# Patient Record
Sex: Female | Born: 1940 | Race: White | Hispanic: No | Smoking: Current every day smoker
Health system: Southern US, Community
[De-identification: ages and names within clinical notes are randomized; demographics above are authoritative.]

## PROBLEM LIST (undated history)

## (undated) DIAGNOSIS — E039 Hypothyroidism, unspecified: Secondary | ICD-10-CM

## (undated) DIAGNOSIS — E079 Disorder of thyroid, unspecified: Secondary | ICD-10-CM

## (undated) DIAGNOSIS — Z8489 Family history of other specified conditions: Secondary | ICD-10-CM

## (undated) DIAGNOSIS — J439 Emphysema, unspecified: Secondary | ICD-10-CM

## (undated) DIAGNOSIS — K219 Gastro-esophageal reflux disease without esophagitis: Secondary | ICD-10-CM

## (undated) HISTORY — PX: TUBAL LIGATION: SHX77

---

## 2004-02-06 ENCOUNTER — Other Ambulatory Visit: Payer: Self-pay

## 2004-06-15 ENCOUNTER — Emergency Department: Payer: Self-pay | Admitting: Emergency Medicine

## 2005-08-31 ENCOUNTER — Other Ambulatory Visit: Payer: Self-pay

## 2005-08-31 ENCOUNTER — Emergency Department: Payer: Self-pay | Admitting: Emergency Medicine

## 2005-10-08 ENCOUNTER — Ambulatory Visit: Payer: Self-pay | Admitting: Family Medicine

## 2005-10-14 ENCOUNTER — Ambulatory Visit: Payer: Self-pay | Admitting: Family Medicine

## 2006-09-04 ENCOUNTER — Emergency Department: Payer: Self-pay | Admitting: Internal Medicine

## 2006-09-05 ENCOUNTER — Emergency Department: Payer: Self-pay | Admitting: General Practice

## 2007-09-15 ENCOUNTER — Ambulatory Visit: Payer: Self-pay | Admitting: Internal Medicine

## 2010-01-04 ENCOUNTER — Ambulatory Visit: Payer: Self-pay | Admitting: Gastroenterology

## 2010-04-16 ENCOUNTER — Ambulatory Visit: Payer: Self-pay | Admitting: Ophthalmology

## 2010-04-23 ENCOUNTER — Ambulatory Visit: Payer: Self-pay | Admitting: Ophthalmology

## 2010-06-18 ENCOUNTER — Ambulatory Visit: Payer: Self-pay | Admitting: Ophthalmology

## 2010-06-25 ENCOUNTER — Ambulatory Visit: Payer: Self-pay | Admitting: Ophthalmology

## 2011-02-02 ENCOUNTER — Emergency Department: Payer: Self-pay | Admitting: Emergency Medicine

## 2011-02-24 ENCOUNTER — Inpatient Hospital Stay: Payer: Self-pay | Admitting: Internal Medicine

## 2011-07-01 ENCOUNTER — Inpatient Hospital Stay: Payer: Self-pay | Admitting: Internal Medicine

## 2011-07-13 ENCOUNTER — Emergency Department: Payer: Self-pay | Admitting: Unknown Physician Specialty

## 2011-07-16 ENCOUNTER — Observation Stay: Payer: Self-pay | Admitting: Internal Medicine

## 2011-07-18 ENCOUNTER — Observation Stay: Payer: Self-pay | Admitting: Internal Medicine

## 2011-07-23 LAB — BASIC METABOLIC PANEL
BUN: 13 mg/dL (ref 7–18)
Calcium, Total: 9.1 mg/dL (ref 8.5–10.1)
Chloride: 106 mmol/L (ref 98–107)
Creatinine: 0.57 mg/dL — ABNORMAL LOW (ref 0.60–1.30)
EGFR (African American): >60
Osmolality: 281 (ref 275–301)

## 2011-07-23 LAB — CBC WITH DIFFERENTIAL/PLATELET
Eosinophil %: 2.2 %
HGB: 10 g/dL — ABNORMAL LOW (ref 12.0–16.0)
Lymphocyte %: 21.3 %
MCH: 28.2 pg (ref 26.0–34.0)
MCHC: 31.5 g/dL — ABNORMAL LOW (ref 32.0–36.0)
MCV: 89 fL (ref 80–100)
Monocyte %: 10.9 %
Neutrophil %: 65.6 %
Platelet: 315 10*3/uL (ref 150–440)

## 2011-08-02 ENCOUNTER — Emergency Department: Payer: Self-pay | Admitting: Emergency Medicine

## 2011-08-02 LAB — COMPREHENSIVE METABOLIC PANEL
Anion Gap: 12 (ref 7–16)
Bilirubin,Total: 0.2 mg/dL (ref 0.2–1.0)
Calcium, Total: 8.7 mg/dL (ref 8.5–10.1)
Chloride: 107 mmol/L (ref 98–107)
Creatinine: 0.59 mg/dL — ABNORMAL LOW (ref 0.60–1.30)
EGFR (African American): >60
Glucose: 124 mg/dL — ABNORMAL HIGH (ref 65–99)
SGOT(AST): 20 U/L (ref 15–37)
Sodium: 142 mmol/L (ref 136–145)

## 2011-08-02 LAB — CBC
HGB: 10.3 g/dL — ABNORMAL LOW (ref 12.0–16.0)
MCH: 27.6 pg (ref 26.0–34.0)
MCHC: 32 g/dL (ref 32.0–36.0)
Platelet: 348 10*3/uL (ref 150–440)
WBC: 5.1 10*3/uL (ref 3.6–11.0)

## 2011-08-02 LAB — URINALYSIS, COMPLETE
Bacteria: NONE SEEN
Bilirubin,UR: NEGATIVE
Glucose,UR: NEGATIVE mg/dL (ref 0–75)
Ketone: NEGATIVE
Leukocyte Esterase: NEGATIVE
Ph: 6 (ref 4.5–8.0)
Protein: NEGATIVE
RBC,UR: <1 /HPF (ref 0–5)
WBC UR: <1 /HPF (ref 0–5)

## 2011-08-02 LAB — TROPONIN I: Troponin-I: <0.02 ng/mL

## 2011-08-07 LAB — COMPREHENSIVE METABOLIC PANEL
Albumin: 3.4 g/dL (ref 3.4–5.0)
Anion Gap: 13 (ref 7–16)
BUN: 12 mg/dL (ref 7–18)
Bilirubin,Total: 0.2 mg/dL (ref 0.2–1.0)
Creatinine: 0.6 mg/dL (ref 0.60–1.30)
Glucose: 82 mg/dL (ref 65–99)
Potassium: 3.6 mmol/L (ref 3.5–5.1)
SGOT(AST): 23 U/L (ref 15–37)
Sodium: 148 mmol/L — ABNORMAL HIGH (ref 136–145)
Total Protein: 7 g/dL (ref 6.4–8.2)

## 2011-08-07 LAB — CBC
HCT: 34 % — ABNORMAL LOW (ref 35.0–47.0)
HGB: 10.9 g/dL — ABNORMAL LOW (ref 12.0–16.0)
MCH: 27.4 pg (ref 26.0–34.0)
MCHC: 32.2 g/dL (ref 32.0–36.0)
MCV: 85 fL (ref 80–100)
Platelet: 365 10*3/uL (ref 150–440)
RBC: 4 10*6/uL (ref 3.80–5.20)
WBC: 5.7 10*3/uL (ref 3.6–11.0)

## 2011-08-07 LAB — TSH: Thyroid Stimulating Horm: 2.12 u[IU]/mL

## 2011-08-07 LAB — SALICYLATE LEVEL: Salicylates, Serum: <1.7 mg/dL

## 2011-08-07 LAB — URINALYSIS, COMPLETE
Bacteria: NONE SEEN
Bilirubin,UR: NEGATIVE
Blood: NEGATIVE
Glucose,UR: NEGATIVE mg/dL (ref 0–75)
Leukocyte Esterase: NEGATIVE
Nitrite: NEGATIVE
Protein: NEGATIVE
RBC,UR: <1 /HPF (ref 0–5)
Squamous Epithelial: NONE SEEN

## 2011-08-07 LAB — DRUG SCREEN, URINE
Barbiturates, Ur Screen: NEGATIVE (ref ?–200)
Cannabinoid 50 Ng, Ur ~~LOC~~: NEGATIVE (ref ?–50)
Cocaine Metabolite,Ur ~~LOC~~: NEGATIVE (ref ?–300)
MDMA (Ecstasy)Ur Screen: NEGATIVE (ref ?–500)
Tricyclic, Ur Screen: NEGATIVE (ref ?–1000)

## 2011-08-07 LAB — ETHANOL
Ethanol %: <0.003 % (ref 0.000–0.080)
Ethanol: <3 mg/dL

## 2011-08-08 ENCOUNTER — Inpatient Hospital Stay: Payer: Self-pay | Admitting: Psychiatry

## 2011-08-15 LAB — URINALYSIS, COMPLETE
Bacteria: NONE SEEN
Bilirubin,UR: NEGATIVE
Blood: NEGATIVE
Leukocyte Esterase: NEGATIVE
Ph: 7 (ref 4.5–8.0)
Protein: NEGATIVE
RBC,UR: <1 /HPF (ref 0–5)
Specific Gravity: 1.006 (ref 1.003–1.030)

## 2011-08-18 LAB — CBC WITH DIFFERENTIAL/PLATELET
Basophil #: 0 10*3/uL (ref 0.0–0.1)
Eosinophil #: 0 10*3/uL (ref 0.0–0.7)
HCT: 36.8 % (ref 35.0–47.0)
Lymphocyte %: 19 %
MCHC: 32.6 g/dL (ref 32.0–36.0)
Monocyte %: 9.3 %
Neutrophil #: 4 10*3/uL (ref 1.4–6.5)
Neutrophil %: 70.5 %
Platelet: 320 10*3/uL (ref 150–440)
RBC: 4.34 10*6/uL (ref 3.80–5.20)
RDW: 17.5 % — ABNORMAL HIGH (ref 11.5–14.5)
WBC: 5.5 10*3/uL (ref 3.6–11.0)

## 2011-08-19 LAB — CLOSTRIDIUM DIFFICILE BY PCR

## 2011-08-24 ENCOUNTER — Inpatient Hospital Stay: Payer: Self-pay | Admitting: Internal Medicine

## 2011-08-24 LAB — COMPREHENSIVE METABOLIC PANEL
Albumin: 3.3 g/dL — ABNORMAL LOW (ref 3.4–5.0)
Alkaline Phosphatase: 61 U/L (ref 50–136)
Anion Gap: 11 (ref 7–16)
BUN: 16 mg/dL (ref 7–18)
Calcium, Total: 8.9 mg/dL (ref 8.5–10.1)
Chloride: 108 mmol/L — ABNORMAL HIGH (ref 98–107)
Co2: 24 mmol/L (ref 21–32)
EGFR (African American): >60
EGFR (Non-African Amer.): >60
Osmolality: 287 (ref 275–301)
Potassium: 3.8 mmol/L (ref 3.5–5.1)
SGOT(AST): 31 U/L (ref 15–37)
SGPT (ALT): 21 U/L

## 2011-08-24 LAB — URINALYSIS, COMPLETE
Bacteria: NONE SEEN
Bilirubin,UR: NEGATIVE
Blood: NEGATIVE
Glucose,UR: NEGATIVE mg/dL (ref 0–75)
Ketone: NEGATIVE
Leukocyte Esterase: NEGATIVE
Ph: 7 (ref 4.5–8.0)
Specific Gravity: 1.008 (ref 1.003–1.030)
Squamous Epithelial: NONE SEEN
WBC UR: 1 /HPF (ref 0–5)

## 2011-08-24 LAB — CBC
HCT: 36.5 % (ref 35.0–47.0)
HGB: 11.6 g/dL — ABNORMAL LOW (ref 12.0–16.0)
MCH: 27.4 pg (ref 26.0–34.0)
MCHC: 31.9 g/dL — ABNORMAL LOW (ref 32.0–36.0)
RBC: 4.24 10*6/uL (ref 3.80–5.20)
RDW: 19.4 % — ABNORMAL HIGH (ref 11.5–14.5)
WBC: 10 10*3/uL (ref 3.6–11.0)

## 2011-08-24 LAB — TROPONIN I
Troponin-I: 0.59 ng/mL — ABNORMAL HIGH
Troponin-I: 0.25 ng/mL — ABNORMAL HIGH
Troponin-I: 0.18 ng/mL — ABNORMAL HIGH

## 2011-08-24 LAB — MAGNESIUM: Magnesium: 2.1 mg/dL

## 2011-08-24 LAB — CK TOTAL AND CKMB (NOT AT ARMC): CK-MB: 0.9 ng/mL (ref 0.5–3.6)

## 2011-08-25 LAB — COMPREHENSIVE METABOLIC PANEL WITH GFR
Albumin: 2.6 g/dL — ABNORMAL LOW
Alkaline Phosphatase: 51 U/L
Anion Gap: 11
BUN: 9 mg/dL
Bilirubin,Total: 0.2 mg/dL
Calcium, Total: 8.4 mg/dL — ABNORMAL LOW
Chloride: 111 mmol/L — ABNORMAL HIGH
Co2: 24 mmol/L
Creatinine: 0.44 mg/dL — ABNORMAL LOW
EGFR (African American): >60
EGFR (Non-African Amer.): >60
Glucose: 79 mg/dL
Osmolality: 288
Potassium: 3.2 mmol/L — ABNORMAL LOW
SGOT(AST): 17 U/L
SGPT (ALT): 15 U/L
Sodium: 146 mmol/L — ABNORMAL HIGH
Total Protein: 5.8 g/dL — ABNORMAL LOW

## 2011-08-25 LAB — CBC WITH DIFFERENTIAL/PLATELET
Basophil #: 0 10*3/uL (ref 0.0–0.1)
Eosinophil #: 0.1 10*3/uL (ref 0.0–0.7)
HCT: 31.3 % — ABNORMAL LOW (ref 35.0–47.0)
HGB: 9.8 g/dL — ABNORMAL LOW (ref 12.0–16.0)
Lymphocyte #: 1.8 10*3/uL (ref 1.0–3.6)
Lymphocyte %: 18.5 %
MCHC: 31.4 g/dL — ABNORMAL LOW (ref 32.0–36.0)
MCV: 87 fL (ref 80–100)
Monocyte #: 1.1 10*3/uL — ABNORMAL HIGH (ref 0.0–0.7)
Monocyte %: 11.2 %
Neutrophil #: 6.7 10*3/uL — ABNORMAL HIGH (ref 1.4–6.5)
Neutrophil %: 68.9 %
WBC: 9.8 10*3/uL (ref 3.6–11.0)

## 2011-08-25 LAB — LIPID PANEL
Cholesterol: 158 mg/dL
HDL Cholesterol: 64 mg/dL — ABNORMAL HIGH
Ldl Cholesterol, Calc: 82 mg/dL
Triglycerides: 61 mg/dL
VLDL Cholesterol, Calc: 12 mg/dL

## 2011-08-25 LAB — TSH: Thyroid Stimulating Horm: 0.959 u[IU]/mL

## 2011-08-26 LAB — CBC WITH DIFFERENTIAL/PLATELET
Basophil #: 0 10*3/uL (ref 0.0–0.1)
Eosinophil #: 0.4 10*3/uL (ref 0.0–0.7)
Eosinophil %: 4.6 %
HCT: 33.5 % — ABNORMAL LOW (ref 35.0–47.0)
HGB: 10.3 g/dL — ABNORMAL LOW (ref 12.0–16.0)
Lymphocyte #: 0.9 10*3/uL — ABNORMAL LOW (ref 1.0–3.6)
Lymphocyte %: 11.3 %
MCH: 26.7 pg (ref 26.0–34.0)
MCHC: 30.9 g/dL — ABNORMAL LOW (ref 32.0–36.0)
Monocyte #: 0.8 10*3/uL — ABNORMAL HIGH (ref 0.0–0.7)
Monocyte %: 9.5 %
Neutrophil %: 74.6 %
Platelet: 266 10*3/uL (ref 150–440)
RDW: 19.1 % — ABNORMAL HIGH (ref 11.5–14.5)
WBC: 8.2 10*3/uL (ref 3.6–11.0)

## 2011-08-27 LAB — HEMOGLOBIN: HGB: 10.6 g/dL — ABNORMAL LOW (ref 12.0–16.0)

## 2011-08-28 LAB — HEMOGLOBIN: HGB: 9.4 g/dL — ABNORMAL LOW (ref 12.0–16.0)

## 2011-08-29 LAB — BASIC METABOLIC PANEL
BUN: 8 mg/dL (ref 7–18)
Chloride: 106 mmol/L (ref 98–107)
Co2: 25 mmol/L (ref 21–32)
Creatinine: 0.5 mg/dL — ABNORMAL LOW (ref 0.60–1.30)
EGFR (African American): >60
Glucose: 93 mg/dL (ref 65–99)
Potassium: 4.1 mmol/L (ref 3.5–5.1)
Sodium: 143 mmol/L (ref 136–145)

## 2011-08-29 LAB — HEMOGLOBIN: HGB: 10.4 g/dL — ABNORMAL LOW (ref 12.0–16.0)

## 2011-10-06 ENCOUNTER — Observation Stay: Payer: Self-pay | Admitting: Internal Medicine

## 2011-10-06 LAB — URINALYSIS, COMPLETE
Glucose,UR: NEGATIVE mg/dL (ref 0–75)
Ketone: NEGATIVE
Leukocyte Esterase: NEGATIVE
Ph: 7 (ref 4.5–8.0)
Protein: NEGATIVE
RBC,UR: NONE SEEN /HPF (ref 0–5)
Specific Gravity: 1.006 (ref 1.003–1.030)
Squamous Epithelial: <1
WBC UR: <1 /HPF (ref 0–5)

## 2011-10-06 LAB — TSH
Thyroid Stimulating Horm: 1.72 u[IU]/mL
Thyroid Stimulating Horm: 1.75 u[IU]/mL

## 2011-10-06 LAB — TROPONIN I
Troponin-I: <0.02 ng/mL
Troponin-I: <0.02 ng/mL

## 2011-10-06 LAB — COMPREHENSIVE METABOLIC PANEL
Albumin: 3.4 g/dL (ref 3.4–5.0)
Alkaline Phosphatase: 73 U/L (ref 50–136)
Bilirubin,Total: 0.2 mg/dL (ref 0.2–1.0)
Calcium, Total: 9.2 mg/dL (ref 8.5–10.1)
Co2: 23 mmol/L (ref 21–32)
Creatinine: 0.62 mg/dL (ref 0.60–1.30)
EGFR (Non-African Amer.): >60
Glucose: 87 mg/dL (ref 65–99)
SGPT (ALT): 16 U/L

## 2011-10-06 LAB — CBC
HCT: 40 % (ref 35.0–47.0)
HGB: 12.8 g/dL (ref 12.0–16.0)
MCV: 82 fL (ref 80–100)
RBC: 4.9 10*6/uL (ref 3.80–5.20)
WBC: 6.1 10*3/uL (ref 3.6–11.0)

## 2011-10-06 LAB — CK TOTAL AND CKMB (NOT AT ARMC): CK, Total: 38 U/L (ref 21–215)

## 2011-10-07 LAB — BASIC METABOLIC PANEL
BUN: 13 mg/dL (ref 7–18)
Calcium, Total: 8.7 mg/dL (ref 8.5–10.1)
Co2: 22 mmol/L (ref 21–32)
EGFR (Non-African Amer.): >60
Glucose: 93 mg/dL (ref 65–99)
Osmolality: 287 (ref 275–301)
Potassium: 3.7 mmol/L (ref 3.5–5.1)
Sodium: 144 mmol/L (ref 136–145)

## 2011-10-07 LAB — LIPID PANEL
HDL Cholesterol: 59 mg/dL (ref 40–60)
Ldl Cholesterol, Calc: 130 mg/dL — ABNORMAL HIGH (ref 0–100)

## 2011-10-07 LAB — CBC WITH DIFFERENTIAL/PLATELET
Basophil #: 0 10*3/uL (ref 0.0–0.1)
Basophil %: 0.1 %
Eosinophil #: 0.2 10*3/uL (ref 0.0–0.7)
Lymphocyte #: 1.8 10*3/uL (ref 1.0–3.6)
Lymphocyte %: 29.9 %
Monocyte #: 0.6 10*3/uL (ref 0.0–0.7)
Neutrophil #: 3.6 10*3/uL (ref 1.4–6.5)
Neutrophil %: 57.8 %
Platelet: 286 10*3/uL (ref 150–440)
RBC: 4.65 10*6/uL (ref 3.80–5.20)
WBC: 6.2 10*3/uL (ref 3.6–11.0)

## 2011-10-07 LAB — TROPONIN I: Troponin-I: <0.02 ng/mL

## 2011-10-08 ENCOUNTER — Inpatient Hospital Stay: Payer: Self-pay | Admitting: Psychiatry

## 2011-10-09 LAB — URINALYSIS, COMPLETE
Glucose,UR: 50 mg/dL (ref 0–75)
Ketone: NEGATIVE
Nitrite: NEGATIVE
Ph: 5 (ref 4.5–8.0)
Protein: NEGATIVE
RBC,UR: 1 /HPF (ref 0–5)
Specific Gravity: 1.011 (ref 1.003–1.030)
Squamous Epithelial: <1
WBC UR: 1 /HPF (ref 0–5)

## 2011-10-09 LAB — BEHAVIORAL MEDICINE 1 PANEL
Albumin: 3.2 g/dL — ABNORMAL LOW (ref 3.4–5.0)
Alkaline Phosphatase: 72 U/L (ref 50–136)
Anion Gap: 13 (ref 7–16)
Calcium, Total: 9.1 mg/dL (ref 8.5–10.1)
Co2: 24 mmol/L (ref 21–32)
Comment - H1-Com1: NORMAL
Comment - H1-Com2: NORMAL
EGFR (African American): >60
EGFR (Non-African Amer.): >60
Eosinophil: 6 %
Glucose: 85 mg/dL (ref 65–99)
HCT: 39.3 % (ref 35.0–47.0)
MCH: 25.9 pg — ABNORMAL LOW (ref 26.0–34.0)
MCV: 82 fL (ref 80–100)
Monocytes: 7 %
RBC: 4.79 10*6/uL (ref 3.80–5.20)
RDW: 18.6 % — ABNORMAL HIGH (ref 11.5–14.5)
SGOT(AST): 22 U/L (ref 15–37)
SGPT (ALT): 18 U/L
Thyroid Stimulating Horm: 4.65 u[IU]/mL — ABNORMAL HIGH
Variant Lymphocyte - H1-Rlymph: 5 %

## 2011-10-24 ENCOUNTER — Ambulatory Visit: Payer: Self-pay | Admitting: Psychiatry

## 2011-10-31 ENCOUNTER — Emergency Department: Payer: Self-pay | Admitting: *Deleted

## 2011-11-02 ENCOUNTER — Emergency Department: Payer: Self-pay | Admitting: Emergency Medicine

## 2011-11-13 ENCOUNTER — Emergency Department: Payer: Self-pay | Admitting: Emergency Medicine

## 2011-11-13 LAB — URINALYSIS, COMPLETE
Bacteria: NONE SEEN
Blood: NEGATIVE
Glucose,UR: NEGATIVE mg/dL (ref 0–75)
Hyaline Cast: 1
Leukocyte Esterase: NEGATIVE
Protein: NEGATIVE
Specific Gravity: 1.006 (ref 1.003–1.030)
Squamous Epithelial: NONE SEEN
WBC UR: 1 /HPF (ref 0–5)

## 2011-11-13 LAB — CBC
MCH: 26.6 pg (ref 26.0–34.0)
MCHC: 32.2 g/dL (ref 32.0–36.0)
MCV: 83 fL (ref 80–100)
RBC: 5.18 10*6/uL (ref 3.80–5.20)
RDW: 20.7 % — ABNORMAL HIGH (ref 11.5–14.5)

## 2011-11-13 LAB — COMPREHENSIVE METABOLIC PANEL
Albumin: 3.5 g/dL (ref 3.4–5.0)
Alkaline Phosphatase: 87 U/L (ref 50–136)
BUN: 15 mg/dL (ref 7–18)
Calcium, Total: 8.8 mg/dL (ref 8.5–10.1)
EGFR (African American): >60
EGFR (Non-African Amer.): >60
Glucose: 66 mg/dL (ref 65–99)
Potassium: 4.1 mmol/L (ref 3.5–5.1)
SGOT(AST): 33 U/L (ref 15–37)
Sodium: 141 mmol/L (ref 136–145)
Total Protein: 7.2 g/dL (ref 6.4–8.2)

## 2011-11-13 LAB — TROPONIN I: Troponin-I: <0.02 ng/mL

## 2011-12-11 ENCOUNTER — Emergency Department: Payer: Self-pay | Admitting: Unknown Physician Specialty

## 2011-12-11 LAB — URINALYSIS, COMPLETE
Bacteria: NONE SEEN
Blood: NEGATIVE
Glucose,UR: NEGATIVE mg/dL (ref 0–75)
Nitrite: NEGATIVE
Ph: 5 (ref 4.5–8.0)
Protein: NEGATIVE
Specific Gravity: 1.019 (ref 1.003–1.030)
Squamous Epithelial: 1

## 2011-12-11 LAB — COMPREHENSIVE METABOLIC PANEL
Albumin: 2.9 g/dL — ABNORMAL LOW (ref 3.4–5.0)
Alkaline Phosphatase: 65 U/L (ref 50–136)
Anion Gap: 7 (ref 7–16)
Co2: 26 mmol/L (ref 21–32)
Creatinine: 0.52 mg/dL — ABNORMAL LOW (ref 0.60–1.30)
EGFR (African American): >60
Glucose: 67 mg/dL (ref 65–99)
Potassium: 3.5 mmol/L (ref 3.5–5.1)
SGPT (ALT): 17 U/L

## 2011-12-11 LAB — CBC
HGB: 14.9 g/dL (ref 12.0–16.0)
MCHC: 32 g/dL (ref 32.0–36.0)
RBC: 5.42 10*6/uL — ABNORMAL HIGH (ref 3.80–5.20)
RDW: 20.3 % — ABNORMAL HIGH (ref 11.5–14.5)

## 2011-12-11 LAB — DRUG SCREEN, URINE
Amphetamines, Ur Screen: NEGATIVE (ref ?–1000)
Barbiturates, Ur Screen: NEGATIVE (ref ?–200)
MDMA (Ecstasy)Ur Screen: NEGATIVE (ref ?–500)
Methadone, Ur Screen: NEGATIVE (ref ?–300)
Tricyclic, Ur Screen: NEGATIVE (ref ?–1000)

## 2011-12-11 LAB — ACETAMINOPHEN LEVEL: Acetaminophen: <2 ug/mL

## 2011-12-11 LAB — ETHANOL
Ethanol %: <0.003 % (ref 0.000–0.080)
Ethanol: <3 mg/dL

## 2011-12-11 LAB — SALICYLATE LEVEL: Salicylates, Serum: 2.1 mg/dL

## 2011-12-13 LAB — URINE CULTURE

## 2012-01-07 ENCOUNTER — Emergency Department: Payer: Self-pay | Admitting: Internal Medicine

## 2012-01-07 LAB — URINALYSIS, COMPLETE
Blood: NEGATIVE
Glucose,UR: NEGATIVE mg/dL (ref 0–75)
Protein: NEGATIVE
RBC,UR: 6 /HPF (ref 0–5)
Specific Gravity: 1.021 (ref 1.003–1.030)
Squamous Epithelial: NONE SEEN
WBC UR: <1 /HPF (ref 0–5)

## 2012-01-07 LAB — CBC
HGB: 15.8 g/dL (ref 12.0–16.0)
MCHC: 32.7 g/dL (ref 32.0–36.0)
MCV: 88 fL (ref 80–100)
Platelet: 211 10*3/uL (ref 150–440)
RBC: 5.48 10*6/uL — ABNORMAL HIGH (ref 3.80–5.20)

## 2012-01-07 LAB — DRUG SCREEN, URINE
Barbiturates, Ur Screen: NEGATIVE (ref ?–200)
Cannabinoid 50 Ng, Ur ~~LOC~~: NEGATIVE (ref ?–50)
MDMA (Ecstasy)Ur Screen: NEGATIVE (ref ?–500)
Methadone, Ur Screen: NEGATIVE (ref ?–300)
Opiate, Ur Screen: NEGATIVE (ref ?–300)
Tricyclic, Ur Screen: NEGATIVE (ref ?–1000)

## 2012-01-07 LAB — COMPREHENSIVE METABOLIC PANEL
BUN: 18 mg/dL (ref 7–18)
Bilirubin,Total: 0.3 mg/dL (ref 0.2–1.0)
Calcium, Total: 9 mg/dL (ref 8.5–10.1)
Chloride: 108 mmol/L — ABNORMAL HIGH (ref 98–107)
Co2: 25 mmol/L (ref 21–32)
Creatinine: 0.41 mg/dL — ABNORMAL LOW (ref 0.60–1.30)
EGFR (African American): >60
Potassium: 3.5 mmol/L (ref 3.5–5.1)
SGPT (ALT): 17 U/L
Sodium: 142 mmol/L (ref 136–145)
Total Protein: 7.3 g/dL (ref 6.4–8.2)

## 2012-01-07 LAB — TSH: Thyroid Stimulating Horm: 2.56 u[IU]/mL

## 2012-01-07 LAB — ETHANOL: Ethanol %: <0.003 % (ref 0.000–0.080)

## 2012-01-23 ENCOUNTER — Emergency Department: Payer: Self-pay | Admitting: *Deleted

## 2012-01-23 LAB — CK TOTAL AND CKMB (NOT AT ARMC): CK, Total: 36 U/L (ref 21–215)

## 2012-01-23 LAB — BASIC METABOLIC PANEL
Anion Gap: 7 (ref 7–16)
BUN: 16 mg/dL (ref 7–18)
Calcium, Total: 9 mg/dL (ref 8.5–10.1)
Chloride: 108 mmol/L — ABNORMAL HIGH (ref 98–107)
Co2: 27 mmol/L (ref 21–32)
Osmolality: 284 (ref 275–301)

## 2012-01-23 LAB — CBC
HGB: 14.2 g/dL (ref 12.0–16.0)
MCH: 29.2 pg (ref 26.0–34.0)
MCV: 91 fL (ref 80–100)
Platelet: 218 10*3/uL (ref 150–440)
RBC: 4.85 10*6/uL (ref 3.80–5.20)
RDW: 17.8 % — ABNORMAL HIGH (ref 11.5–14.5)

## 2012-01-23 LAB — TROPONIN I: Troponin-I: <0.02 ng/mL

## 2012-02-21 ENCOUNTER — Emergency Department: Payer: Self-pay | Admitting: *Deleted

## 2012-02-21 LAB — COMPREHENSIVE METABOLIC PANEL
Albumin: 3.2 g/dL — ABNORMAL LOW (ref 3.4–5.0)
Alkaline Phosphatase: 84 U/L (ref 50–136)
Bilirubin,Total: 0.4 mg/dL (ref 0.2–1.0)
Calcium, Total: 8.9 mg/dL (ref 8.5–10.1)
Chloride: 112 mmol/L — ABNORMAL HIGH (ref 98–107)
Creatinine: 0.82 mg/dL (ref 0.60–1.30)
EGFR (Non-African Amer.): >60
SGOT(AST): 26 U/L (ref 15–37)
SGPT (ALT): 15 U/L (ref 12–78)
Sodium: 145 mmol/L (ref 136–145)
Total Protein: 6.4 g/dL (ref 6.4–8.2)

## 2012-02-21 LAB — CBC WITH DIFFERENTIAL/PLATELET
Basophil #: 0.1 10*3/uL (ref 0.0–0.1)
Eosinophil #: 0.3 10*3/uL (ref 0.0–0.7)
Eosinophil %: 4.2 %
HCT: 44.5 % (ref 35.0–47.0)
HGB: 14.8 g/dL (ref 12.0–16.0)
Lymphocyte #: 2 10*3/uL (ref 1.0–3.6)
Lymphocyte %: 27.7 %
MCH: 30.7 pg (ref 26.0–34.0)
MCV: 92 fL (ref 80–100)
Monocyte %: 8.9 %
Neutrophil %: 58.2 %
RBC: 4.83 10*6/uL (ref 3.80–5.20)

## 2012-03-18 ENCOUNTER — Emergency Department: Payer: Self-pay | Admitting: Emergency Medicine

## 2012-03-18 LAB — CBC
MCH: 31.3 pg (ref 26.0–34.0)
MCHC: 33.8 g/dL (ref 32.0–36.0)
MCV: 93 fL (ref 80–100)
Platelet: 211 10*3/uL (ref 150–440)
RDW: 13.6 % (ref 11.5–14.5)
WBC: 6.1 10*3/uL (ref 3.6–11.0)

## 2012-03-18 LAB — URINALYSIS, COMPLETE
Bacteria: NONE SEEN
Bilirubin,UR: NEGATIVE
Glucose,UR: NEGATIVE mg/dL (ref 0–75)
Nitrite: NEGATIVE
Protein: NEGATIVE
Specific Gravity: 1.021 (ref 1.003–1.030)
Squamous Epithelial: 1
WBC UR: 3 /HPF (ref 0–5)

## 2012-03-18 LAB — DRUG SCREEN, URINE
Amphetamines, Ur Screen: NEGATIVE (ref ?–1000)
Benzodiazepine, Ur Scrn: NEGATIVE (ref ?–200)
Cannabinoid 50 Ng, Ur ~~LOC~~: NEGATIVE (ref ?–50)
Cocaine Metabolite,Ur ~~LOC~~: NEGATIVE (ref ?–300)
MDMA (Ecstasy)Ur Screen: NEGATIVE (ref ?–500)
Methadone, Ur Screen: NEGATIVE (ref ?–300)
Phencyclidine (PCP) Ur S: NEGATIVE (ref ?–25)

## 2012-03-18 LAB — COMPREHENSIVE METABOLIC PANEL
Alkaline Phosphatase: 73 U/L (ref 50–136)
BUN: 18 mg/dL (ref 7–18)
Calcium, Total: 8.9 mg/dL (ref 8.5–10.1)
Chloride: 108 mmol/L — ABNORMAL HIGH (ref 98–107)
Co2: 28 mmol/L (ref 21–32)
EGFR (African American): >60
EGFR (Non-African Amer.): >60
SGOT(AST): 20 U/L (ref 15–37)
SGPT (ALT): 13 U/L (ref 12–78)

## 2012-03-18 LAB — ETHANOL: Ethanol %: <0.003 % (ref 0.000–0.080)

## 2012-08-12 ENCOUNTER — Emergency Department: Payer: Self-pay | Admitting: Emergency Medicine

## 2012-08-12 LAB — COMPREHENSIVE METABOLIC PANEL
Albumin: 3.3 g/dL — ABNORMAL LOW (ref 3.4–5.0)
Alkaline Phosphatase: 133 U/L (ref 50–136)
Bilirubin,Total: 0.4 mg/dL (ref 0.2–1.0)
Calcium, Total: 8.8 mg/dL (ref 8.5–10.1)
Co2: 29 mmol/L (ref 21–32)
Creatinine: 0.62 mg/dL (ref 0.60–1.30)
Glucose: 81 mg/dL (ref 65–99)
SGPT (ALT): 15 U/L (ref 12–78)
Total Protein: 7.7 g/dL (ref 6.4–8.2)

## 2012-08-12 LAB — DRUG SCREEN, URINE
Amphetamines, Ur Screen: NEGATIVE (ref ?–1000)
Cocaine Metabolite,Ur ~~LOC~~: NEGATIVE (ref ?–300)
Methadone, Ur Screen: NEGATIVE (ref ?–300)
Opiate, Ur Screen: NEGATIVE (ref ?–300)
Phencyclidine (PCP) Ur S: NEGATIVE (ref ?–25)
Tricyclic, Ur Screen: NEGATIVE (ref ?–1000)

## 2012-08-12 LAB — URINALYSIS, COMPLETE
Bilirubin,UR: NEGATIVE
Blood: NEGATIVE
Ketone: NEGATIVE
Nitrite: NEGATIVE
Ph: 6 (ref 4.5–8.0)
Protein: NEGATIVE
RBC,UR: 5 /HPF (ref 0–5)
Specific Gravity: 1.018 (ref 1.003–1.030)
Squamous Epithelial: NONE SEEN

## 2012-08-12 LAB — TROPONIN I: Troponin-I: <0.02 ng/mL

## 2012-08-12 LAB — CBC
HGB: 15.3 g/dL (ref 12.0–16.0)
MCH: 29.3 pg (ref 26.0–34.0)
MCHC: 33.6 g/dL (ref 32.0–36.0)
MCV: 87 fL (ref 80–100)
Platelet: 272 10*3/uL (ref 150–440)
RBC: 5.24 10*6/uL — ABNORMAL HIGH (ref 3.80–5.20)
RDW: 14.2 % (ref 11.5–14.5)
WBC: 6.6 10*3/uL (ref 3.6–11.0)

## 2012-08-12 LAB — ETHANOL
Ethanol %: <0.003 % (ref 0.000–0.080)
Ethanol: <3 mg/dL

## 2012-08-17 LAB — BASIC METABOLIC PANEL
Anion Gap: 6 — ABNORMAL LOW (ref 7–16)
BUN: 22 mg/dL — ABNORMAL HIGH (ref 7–18)
Calcium, Total: 8.7 mg/dL (ref 8.5–10.1)
Chloride: 106 mmol/L (ref 98–107)
Co2: 28 mmol/L (ref 21–32)
Creatinine: 0.68 mg/dL (ref 0.60–1.30)
EGFR (African American): >60
EGFR (Non-African Amer.): >60
Glucose: 111 mg/dL — ABNORMAL HIGH (ref 65–99)
Osmolality: 283 (ref 275–301)
Potassium: 4 mmol/L (ref 3.5–5.1)
Sodium: 140 mmol/L (ref 136–145)

## 2012-10-29 IMAGING — CR DG CHEST 1V PORT
1 series · 1 of 1 positions shown · non-contrast
Comparison: none

REASON FOR EXAM: overdose
COMMENTS:

[portable]
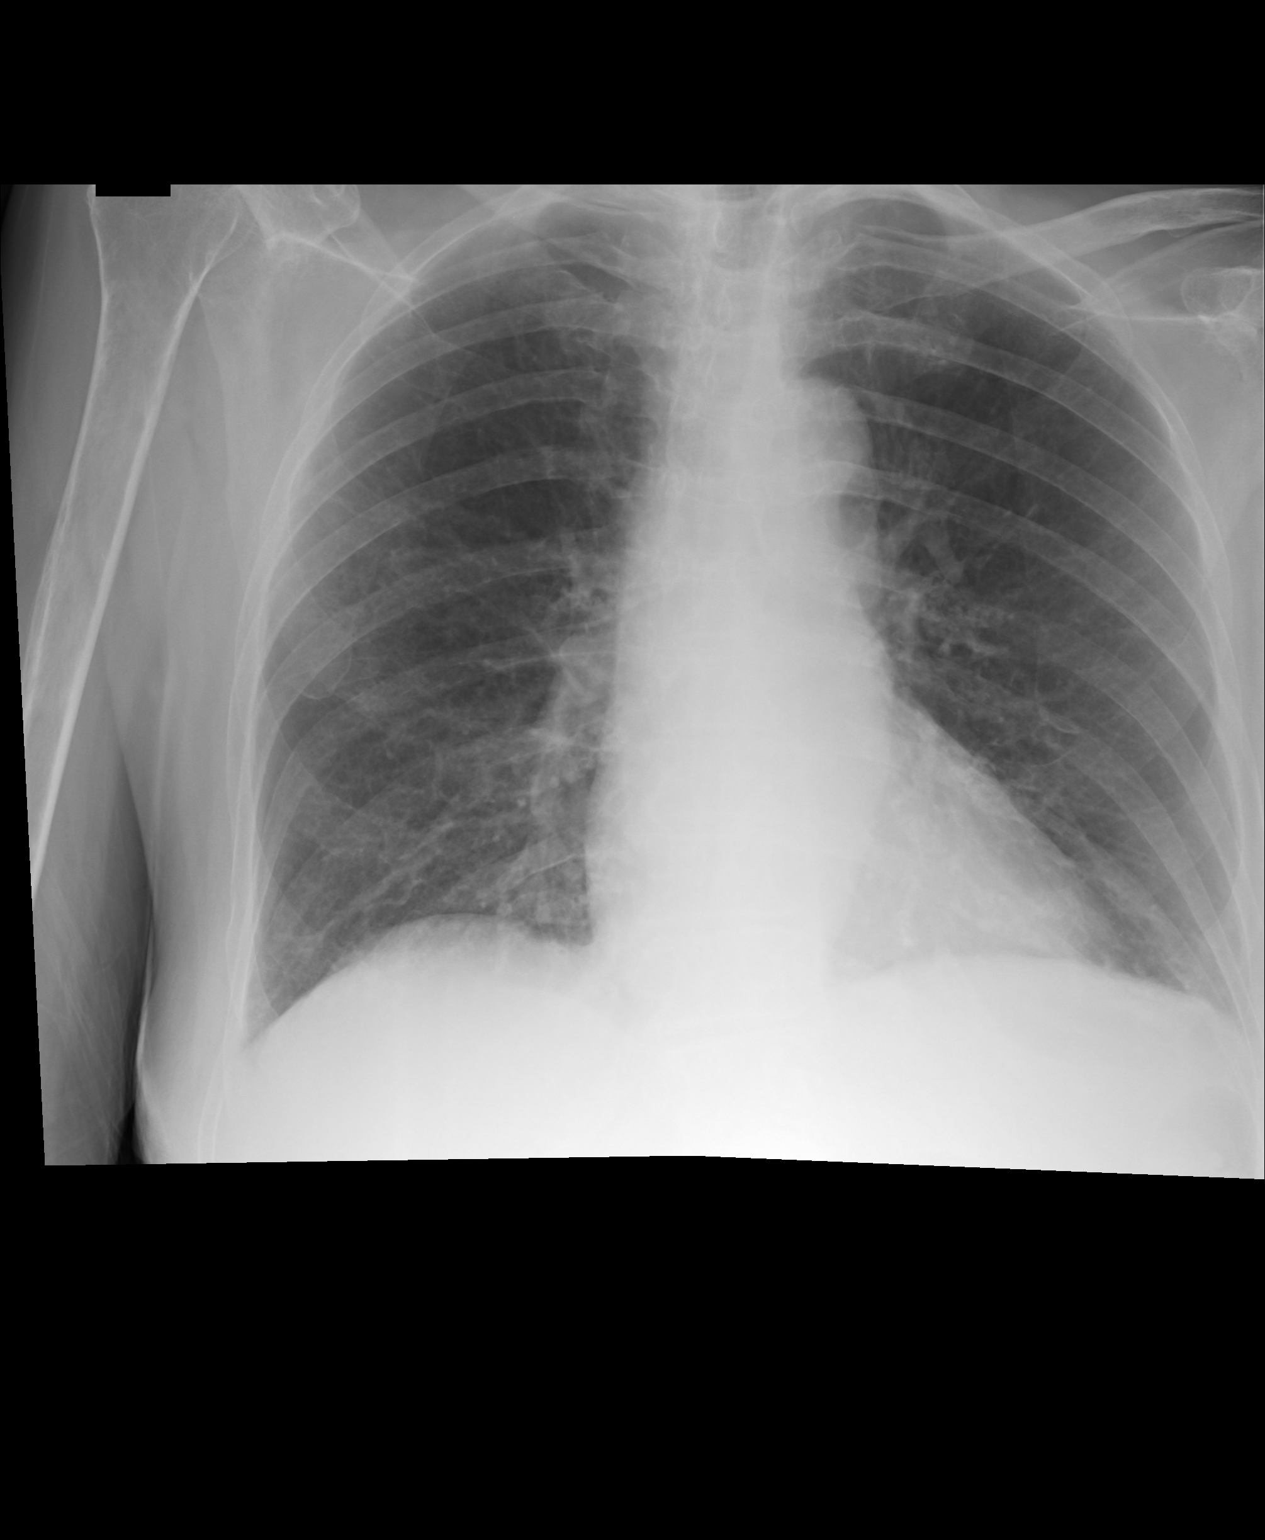

[1 of 1 positions shown; findings below may reference images not displayed]

PROCEDURE:     DXR - DXR PORTABLE CHEST SINGLE VIEW  - August 07, 2011  [DATE]

RESULT:     Comparison is made to the study 02 August, 2011. Comparison
demonstrates no significant change. The lungs are hyperinflated suggestive
of COPD. The cardiac silhouette appears normal. The lung markings are coarse
suggestive of fibrosis. There is no definite edema, infiltrate, effusion or
pneumothorax.
IMPRESSION: 1. COPD with fibrosis. No definite acute cardiopulmonary disease evident.

## 2013-01-22 IMAGING — CR DG LUMBAR SPINE AP/LAT/OBLIQUES W/ FLEX AND EXT
1 series · 6 of 6 positions shown · non-contrast
Comparison: none

REASON FOR EXAM: trauma
COMMENTS:

[Series 1: t lumbar spine ap · 0.14mm/px · 6 of 6 slices shown]
[im 1/6]
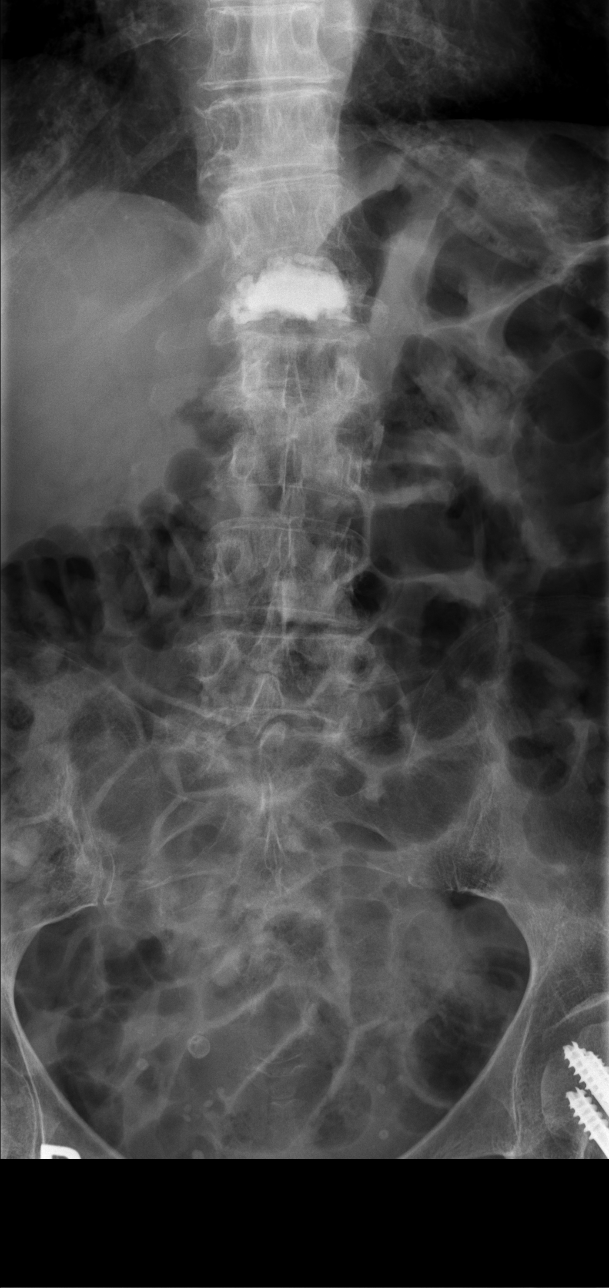
[im 2/6]
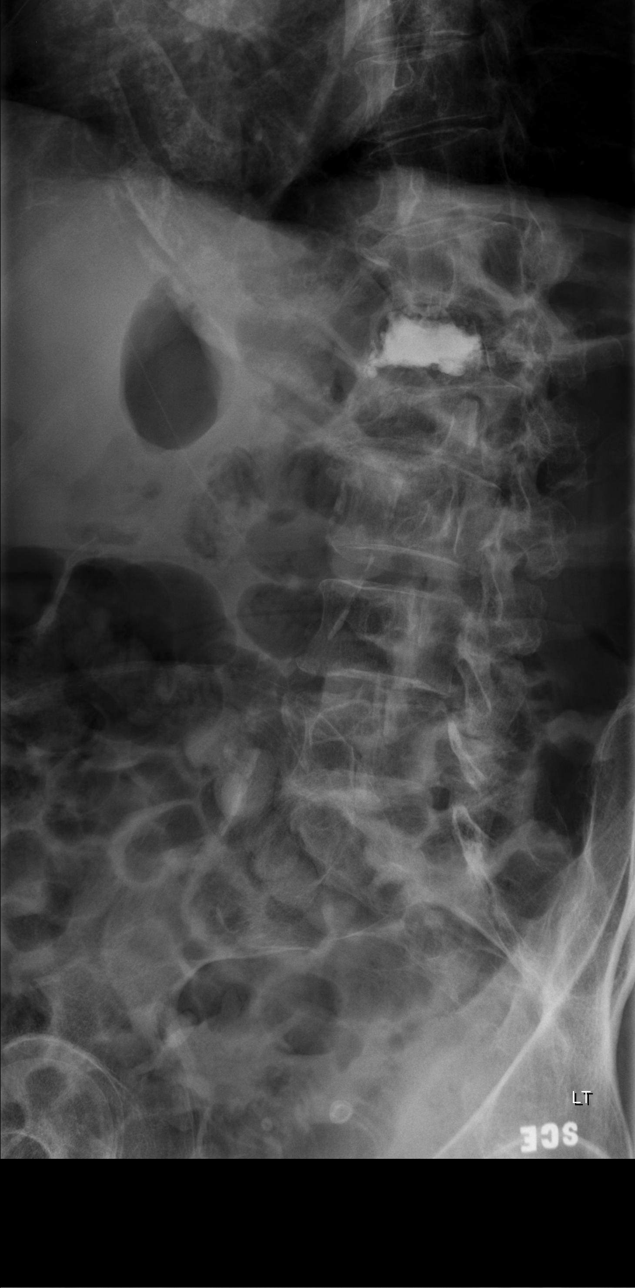
[im 3/6]
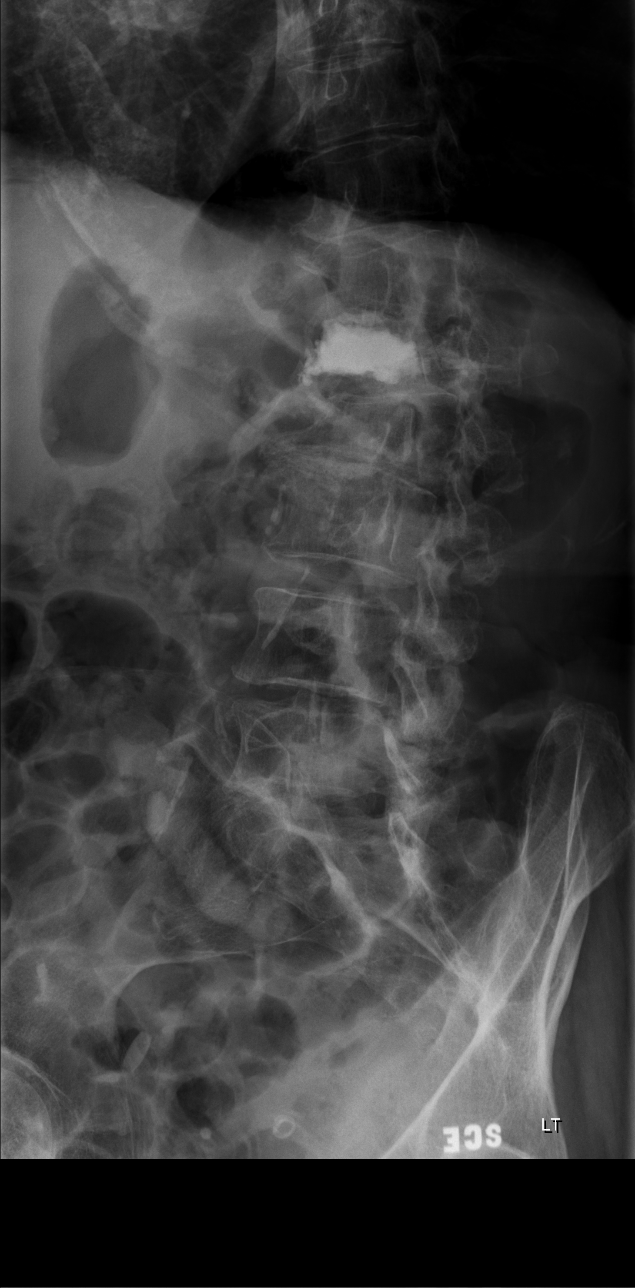
[im 4/6]
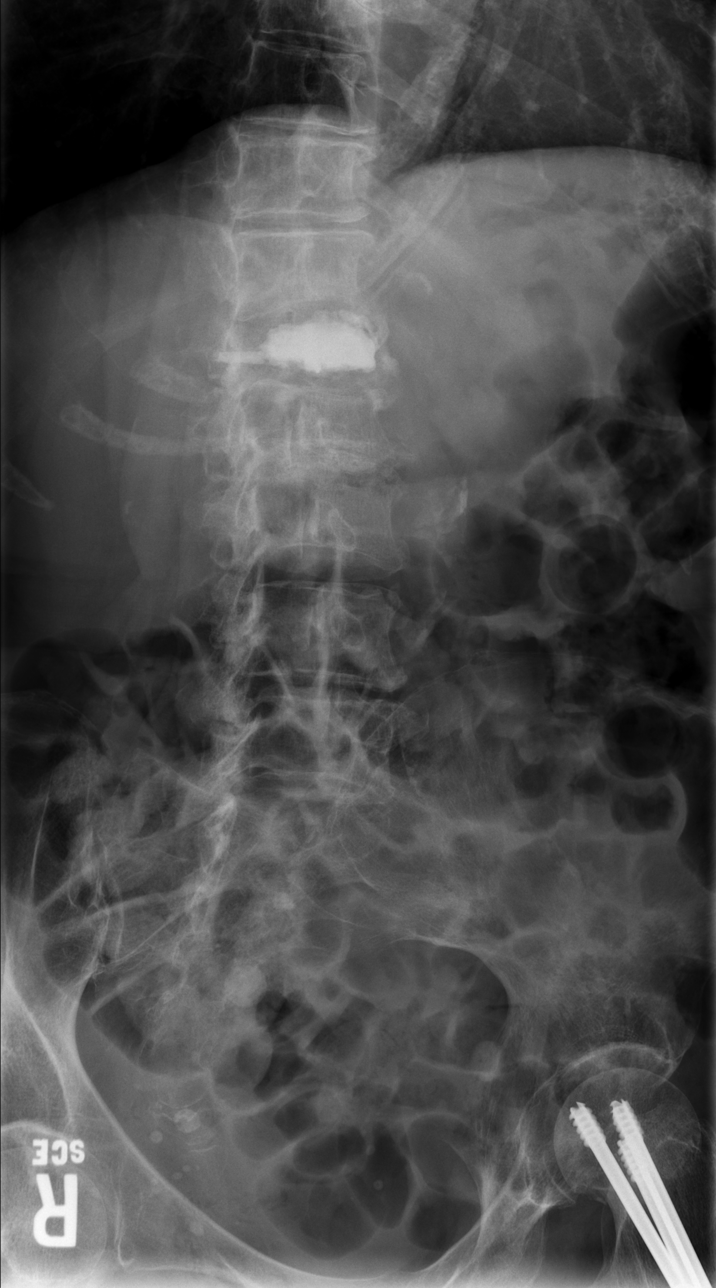
[im 5/6]
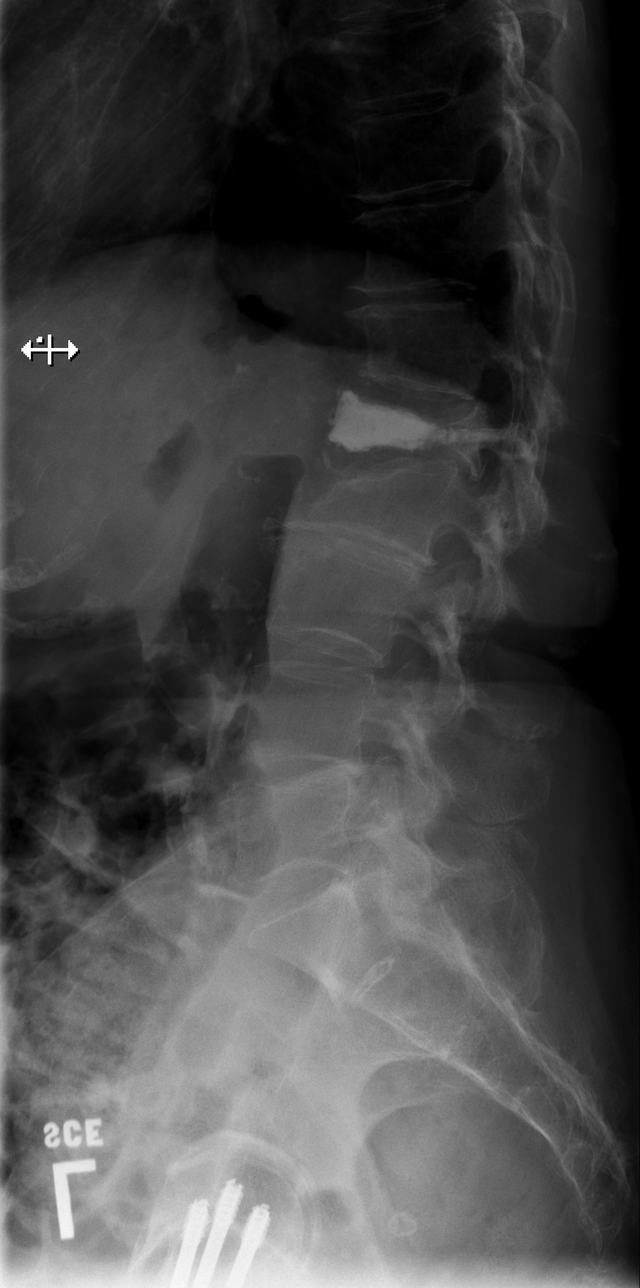
[im 6/6]
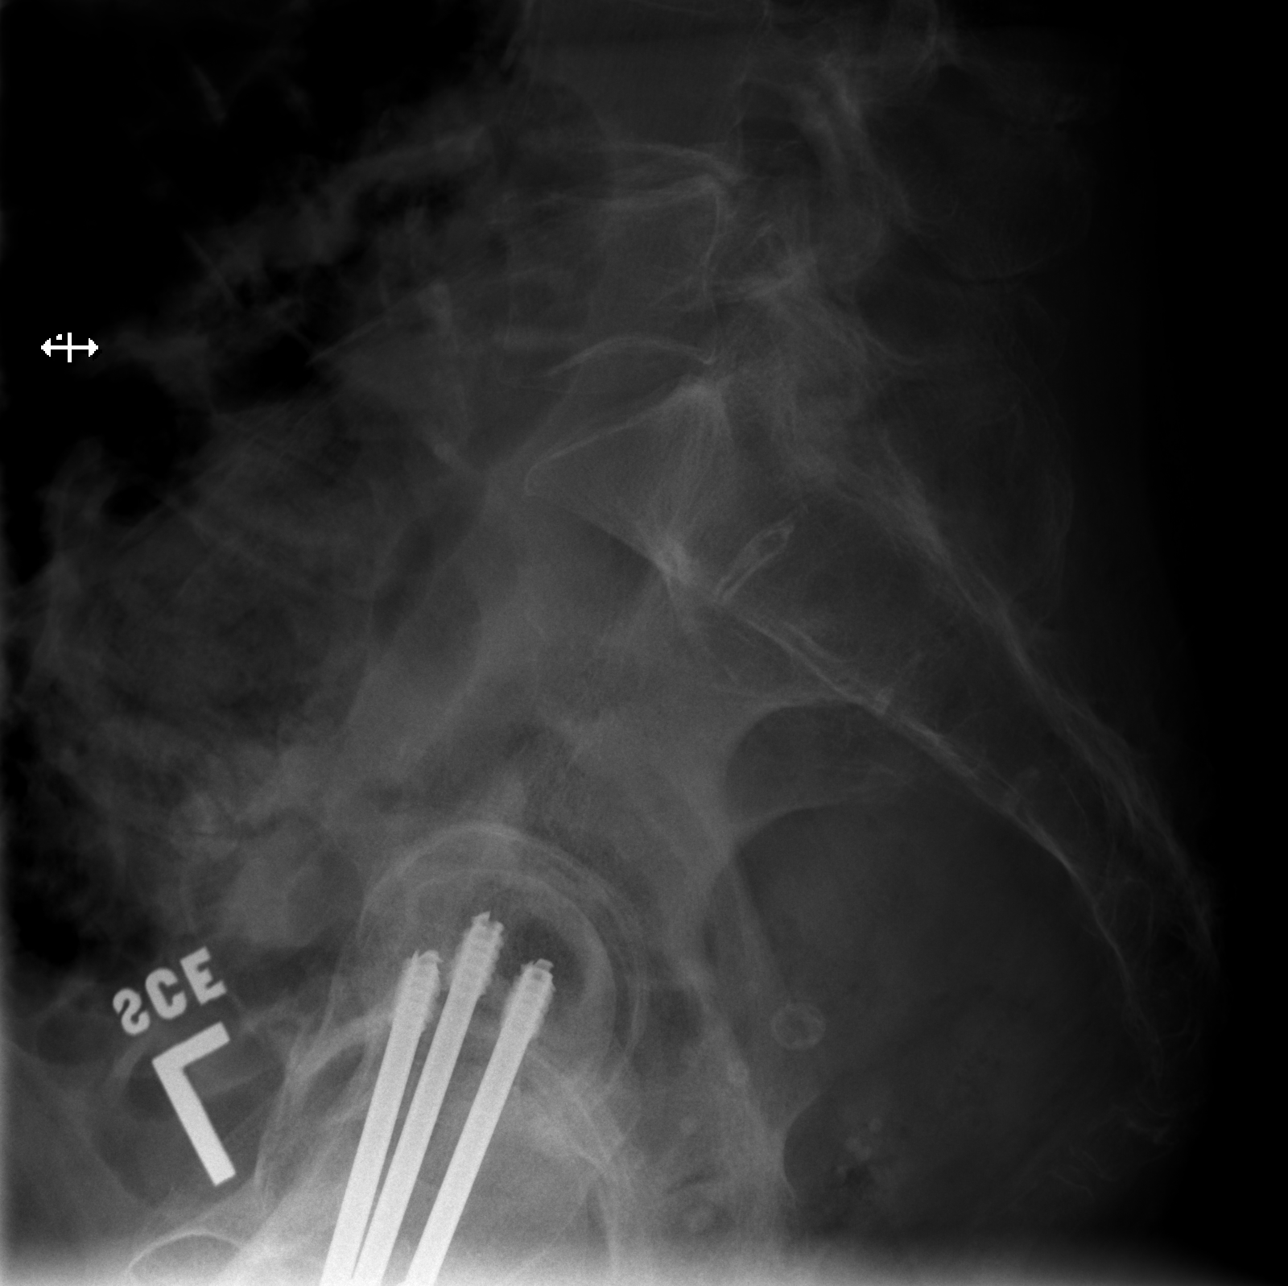

[6 of 6 positions shown; findings below may reference images not displayed]

PROCEDURE:     DXR - DXR LUMBAR SPINE WITH OBLIQUES  - October 31, 2011 [DATE]

RESULT:     Comparison is made to a prior exam of 06/30/2011. Changes of
prior kyphoplasty are noted at L1. There is an old stable appearing anterior
and central vertebral compression deformity at L2. No acute fractures are
seen. There is narrowing of the L2-L3 intervertebral disc space compatible
with disc disease. There is mild narrowing of the L4-L5 intervertebral disc
space consistent with disc disease and unchanged as compared to the prior
exam. The pedicles at L2-L5 are normal in appearance. The pedicles at L1 are
obscured by methylmethacrylate.
IMPRESSION: 1. No acute changes are identified.
2. There are noted findings consistent with disc disease at L2-L3 and at
L4-L5.
3. The patient has had prior kyphoplasty at L1.

## 2013-02-04 IMAGING — CR DG CHEST 2V
1 series · 2 of 2 positions shown · non-contrast
Comparison: none

REASON FOR EXAM: SOB
COMMENTS:

PROCEDURE:     DXR - DXR CHEST PA (OR AP) AND LATERAL  - November 13, 2011  [DATE]
RESULT:     Lungs clear. Heart size normal. Chest stable from prior exam.

[Series 1: pa · 0.17mm/px · 2 of 2 slices shown]
[im 1/2]
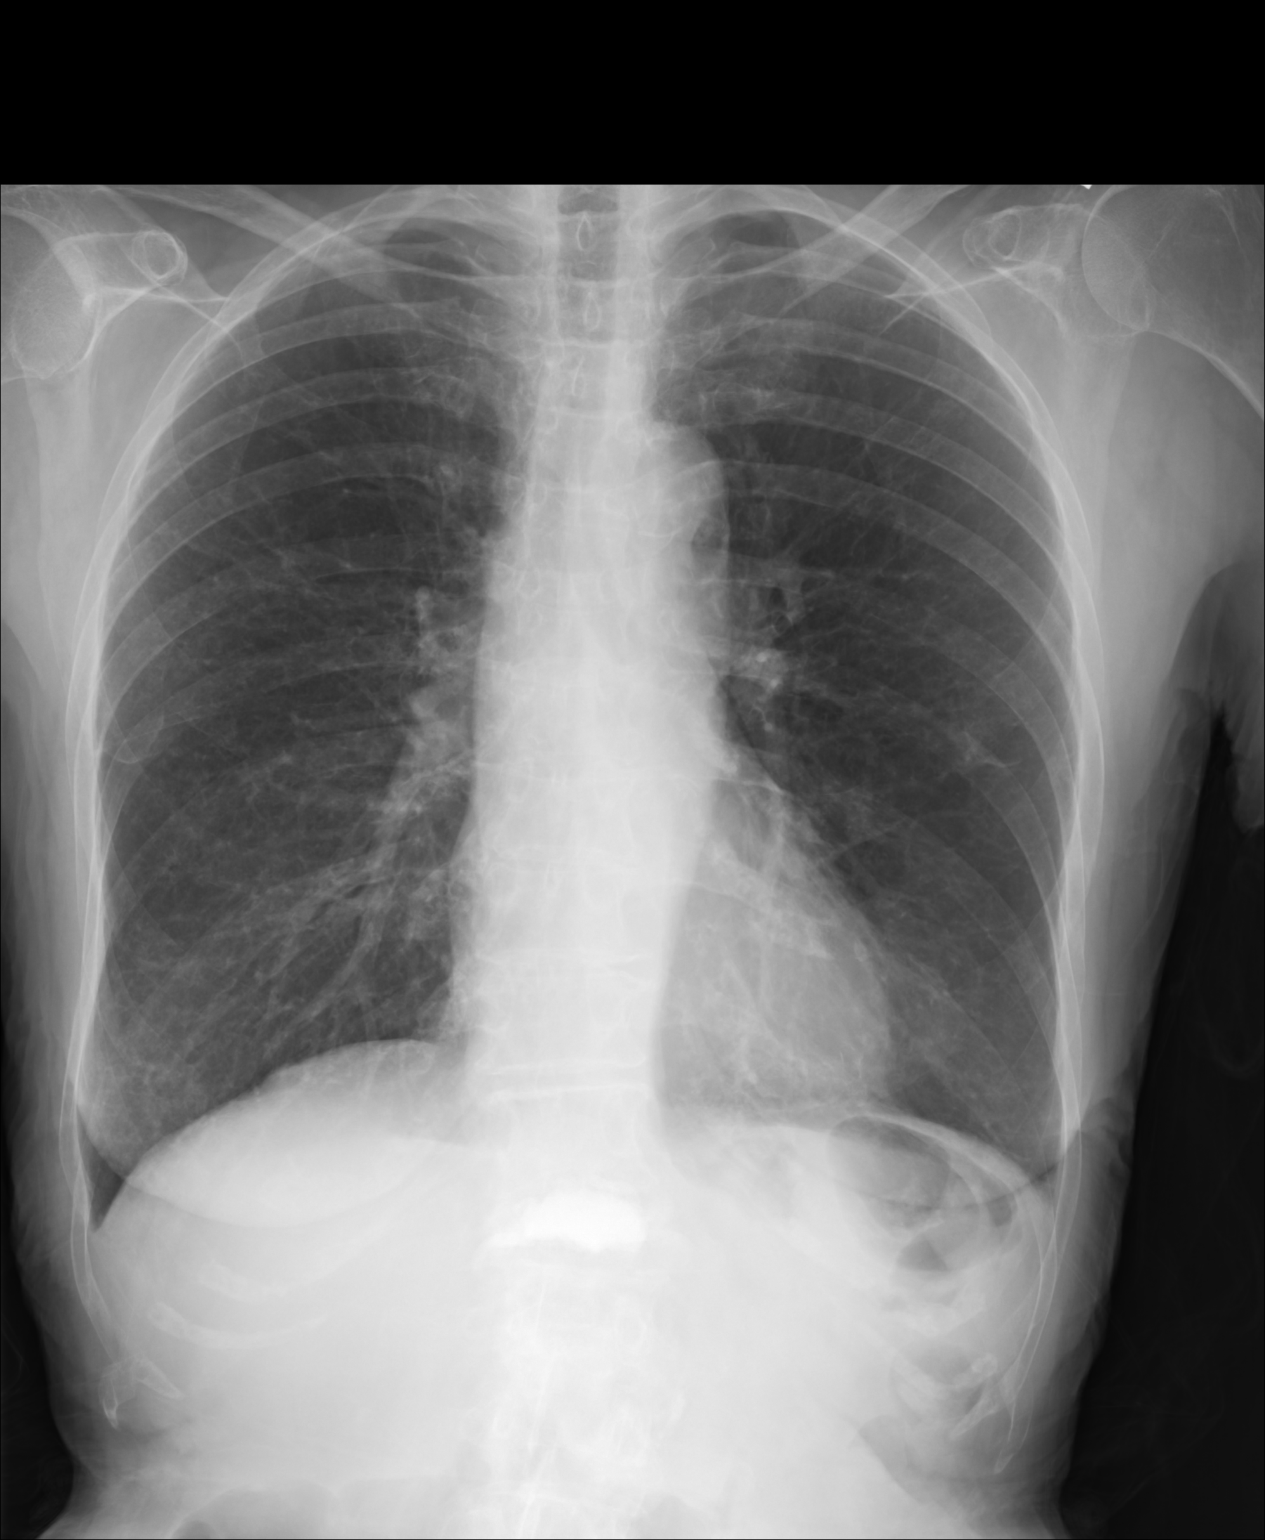
[im 2/2]
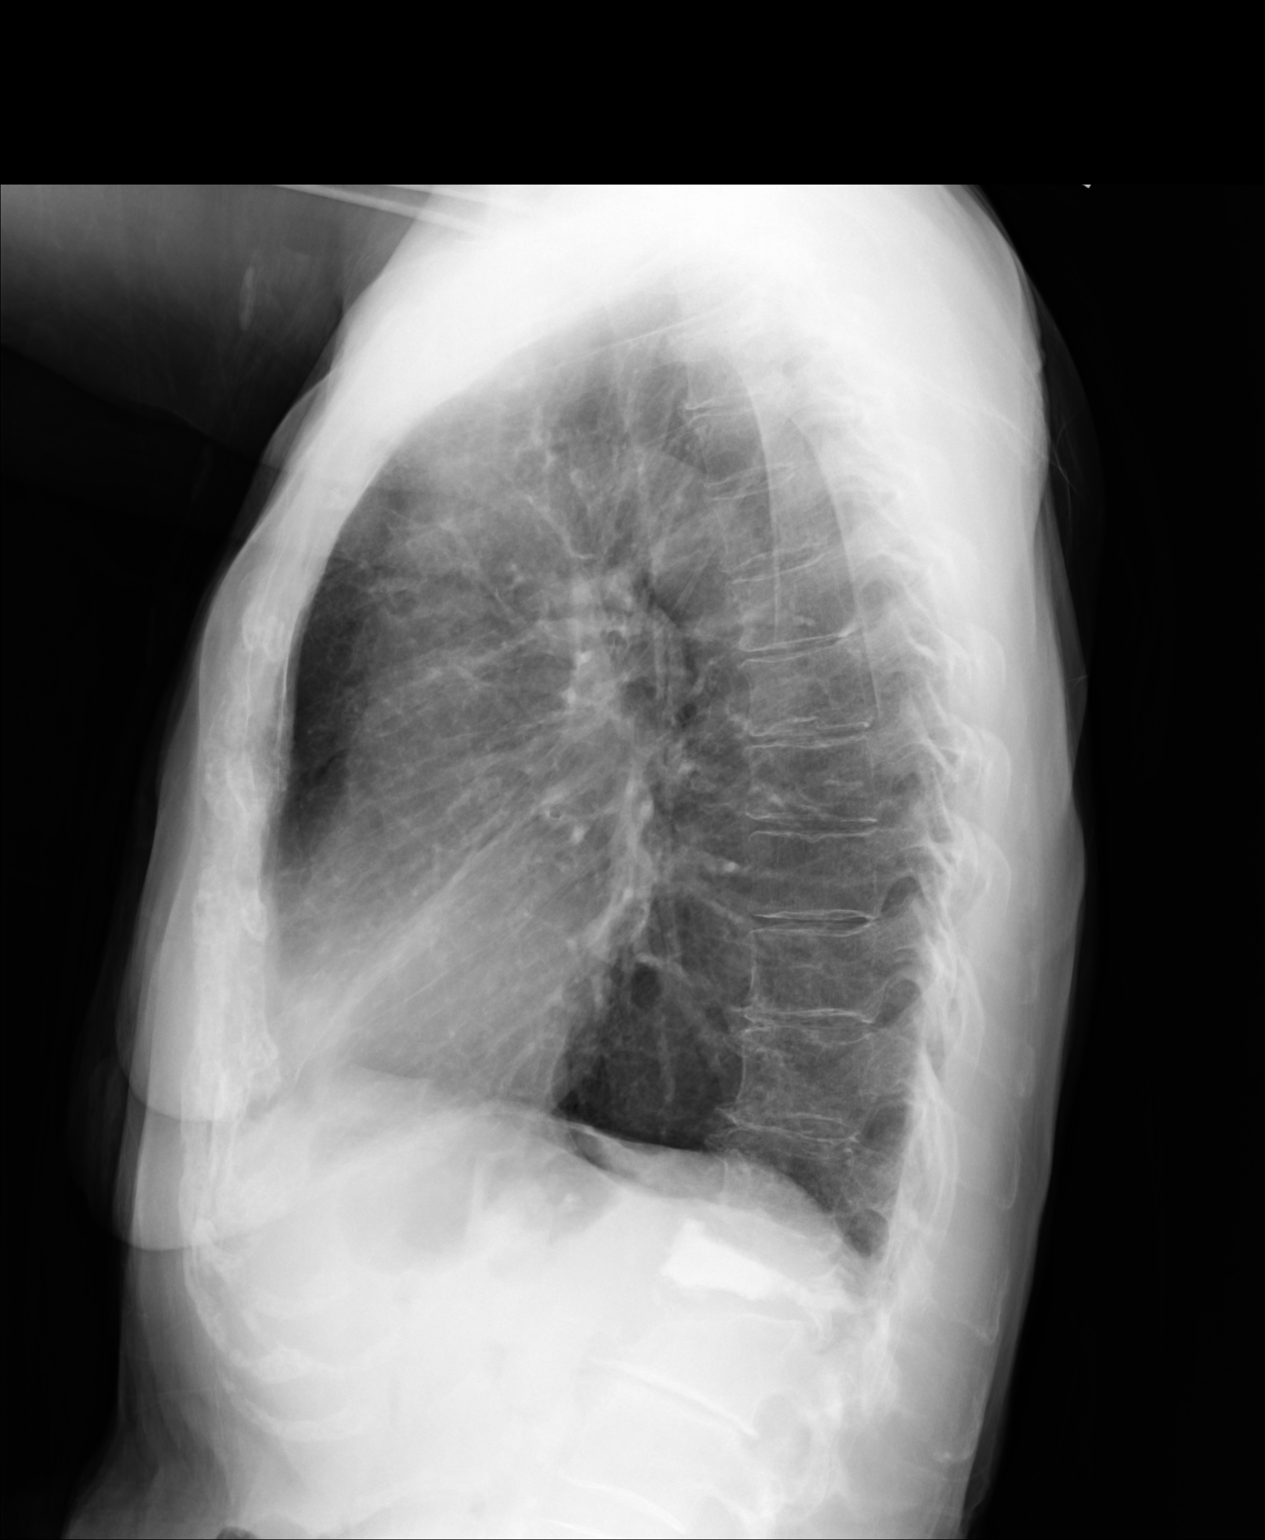

[2 of 2 positions shown; findings below may reference images not displayed]

IMPRESSION: No acute abnormality.

## 2014-11-10 NOTE — Consult Note (Signed)
Brief Consult Note: Diagnosis: Major depression.   Patient was seen by consultant.   Recommend to proceed with surgery or procedure.   Recommend further assessment or treatment.   Orders entered.   Comments: [name] has a h/o depression. She was brought to ER after threatening a suicide in the context of treatment noncompliance. She has not picked up any medications frtom her pharmacy since September 2013.Her family complains thatbthe patient has been incontionrnt of stool and urine.  PLAN: 1. The patient is on IVC.   2. We will restart Remeron for depression and Ambien for sleep.   3. We will restart Protonix.   4. The patient was referred and rejected by all Geropsychiatry Units in . [facility]   5. I will follow up.  Electronic Signatures: Kristine LineaPucilowska, Jolanta (MD)  (Signed 27-Jan-14 15:15)  Authored: Brief Consult Note   Last Updated: 27-Jan-14 15:15 by Kristine LineaPucilowska, Jolanta (MD)

## 2014-11-10 NOTE — Consult Note (Signed)
Brief Consult Note: Diagnosis: Major depression.   Patient was seen by consultant.   Consult note dictated.   Recommend further assessment or treatment.   Orders entered.   Comments: [name] has a h/o depression. She was brought to ER after threatening a suicide in the context of treatment noncompliance. She has not picked up any medications frtom her pharmacy since September 2013.Her family complains that the patient has been incontionrnt of stool and urine.  She has been compliant with medications here. Her sleep and appetite has improved.   PLAN: 1. The patient is on IVC.   2. We will continue Remeron for depression and Ambien for sleep.   3. We will copntinue Protonix.   4. Constipation-bowel regimen ans MOM tonight.   5. The patient was referred and rejected by all Geropsychiatry Units in [place]. [facility]   6. I will follow up.  Electronic Signatures: Kristine LineaPucilowska, Jessicca Stitzer (MD)  (Signed 29-Jan-14 17:49)  Authored: Brief Consult Note   Last Updated: 29-Jan-14 17:49 by Kristine LineaPucilowska, Dakoda Laventure (MD)

## 2014-11-10 NOTE — Consult Note (Signed)
Brief Consult Note: Diagnosis: Major depression.   Patient was seen by consultant.   Consult note dictated.   Recommend further assessment or treatment.   Orders entered.   Comments: [name] has a h/o depression. She was brought to ER after threatening a suicide in the context of treatment noncompliance.   PLAN: 1. The patient is on IVC.   2. She has not picked up any medications frtom her pharmacy since September 2013. We will restart Remeron for depression and Ambien for sleep.   3. We will restart Protonix.   4. The patient was referred and rejected by all Geropsychiatry Units in [place]. [facility]   5. I will follow up.  Electronic Signatures: Kristine LineaPucilowska, Lawrence Mitch (MD)  (Signed 24-Jan-14 12:29)  Authored: Brief Consult Note   Last Updated: 24-Jan-14 12:29 by Kristine LineaPucilowska, Cheney Ewart (MD)

## 2014-11-10 NOTE — Consult Note (Signed)
PATIENT NAME:  Jackie Garcia, Jackie Garcia MR#:  [id number] DATE OF BIRTH:  March 04, 1941  DATE OF ADMISSION:   08/12/2012  DATE OF CONSULTATION:  08/13/2012 and 08/16/2012  REFERRING PHYSICIAN:  Theressa Millard, M.D.  CONSULTING PHYSICIAN:  Tanaja Ganger B. Clifton Safley, MD  REASON FOR CONSULTATION: To evaluate an agitated, unreasonable patient.   IDENTIFYING DATA: The patient is a 74 year old female with history of depression.   CHIEF COMPLAINT: The patient is unable to state.   HISTORY OF PRESENT ILLNESS:  The patient has been hospitalized several times for depression. In the spring of 2013, she was hospitalized several times here for major depression. She received some ECT treatments, but did not continue. She then frequented Emergency Rooms throughout the rest of the year and was referred to geropsychiatry unit several times. She returns to the Emergency Room at the urging of her family, who complains that the patient has not been compliant with her medical appointments or medications since September. She has been increasingly depressed and difficult to manage. She lives with her granddaughter, who has anonymous problems encouraging the patient to eat, to bathe or take care of herself lately. She has been incontinent of stool and urine in bed, and the family had to buy a new mattress. It is strange because the patient in the Emergency Room has no such problems. The patient is unable to tell me why she ended up in the hospital. She believes that she had an argument with her family who is controlling, steals her money, and moved to another room for no good reason. She was not forthcoming with information on Friday. Today, she is slightly more talkative, but appears clueless to the reason of her hospitalization or any problems she might have created. She tells me that she was living independently last year with her sister. When the sister passed away, the patient developed some health problems, and while she was  hospitalized, her family liquidated her household and moved her in with the granddaughter. The patient has never gotten over her sister's death and is utterly unhappy living with the family. She feels that she has no other options.   PAST PSYCHIATRIC HISTORY: As above. She has been hospitalized several times in geropsychiatric unit. Unfortunately, no body is interested to admit this patient now. She has been tried on different medications and had ECT treatment as well.  FAMILY PSYCHIATRIC HISTORY:  None reported.   PAST MEDICAL HISTORY:  Vitamin B12 deficiency.   ALLERGIES: CODEINE, NAPROXEN, PHENERGAN, SULFA DRUGS.   MEDICATIONS ON ADMISSION:  None.   SOCIAL HISTORY:  As above, the patient was relatively recently relocated to live with her family. She is unhappy there, believes that the family takes advantage of her, and as a protest, she allows them to take total care of her. The patient and her family denies any alcohol, illicit substance or prescription pill abuse.   REVIEW OF SYSTEMS:  Difficult to obtain, but the patient denies being in pain.   PHYSICAL EXAMINATION: VITAL SIGNS:  Blood pressure 113/71, pulse 65, respirations 18, temperature 96.2.  GENERAL: This is a slender, older female, in no acute distress. The rest of the physical examination is deferred to her primary attending.   LABORATORY DATA:  Chemistries are within normal limits except for potassium of 3. Blood alcohol level is 0. LFTs within normal limits. TSH with 6.31 with free thyroxine of 1.9. Urine tox screen is negative for substances. CBC within normal limits. Urinalysis is not suggestive of urinary tract infection.  MENTAL STATUS EXAMINATION:  The patient is asleep.  I saw her on several occasions. Sometimes, she is truly asleep, sometimes she gets her eyes closed to avoid any conversation. When she feels like it, she is a pleasant, polite and cooperative. She seems to be oriented to person, place, time and somewhat  situation, although her story is different from her family. She is in bed wearing hospital scrubs. She maintains very limited eye contact. Her speech is soft. There is poverty of speech. She also is hard of hearing, so communication is not easy. Mood is fine with strange affect. Thought processing is logical with its own logic. She denies suicidal or homicidal ideation. She seems to be paranoid and delusional. She denies auditory or visual hallucinations. Her cognition is grossly intact. Her insight and judgment are poor.   SUICIDE RISK ASSESSMENT:  This is a patient with a long history of depression, maybe some cognitive decline, who has been behaving poorly at home.  She is in no shape to plan or execute a suicide attempt but requires support and supervision.   DIAGNOSES: AXIS I:  Mood disorder, not otherwise specified.  AXIS II:  Deferred.  AXIS III:  Vitamin B12 deficiency.  AXIS IV:  Mental illness, primary support, family conflict, poor treatment compliance.  AXIS V:  Global Assessment of Functioning 25.   PLAN:   1.  The patient was referred to multiple geropsychiatric facilities in [place], but we were unable to identify an open bed.  She, therefore, [facility] further care.  2.   The patient is on IVC.  3.  We restarted her medications as per pharmacy report of Remeron 15 mg at night, Ambien for sleep and Protonix for GERD.  The patient initially refused medications, but she is doing better now. I will follow up.      ____________________________ Ellin GoodieJolanta B. Williamson Cavanah, MD jbp:dm D: 08/16/2012 18:42:26 ET T: 08/16/2012 22:48:48 ET JOB#: [id number]  cc: Zaylan Kissoon B. Jennet MaduroPucilowska, MD, <Dictator> Shari ProwsJOLANTA B Tailey Top MD ELECTRONICALLY SIGNED 09/16/2012 6:39

## 2014-11-10 NOTE — Consult Note (Signed)
Brief Consult Note: Diagnosis: Major depression.   Patient was seen by consultant.   Consult note dictated.   Recommend further assessment or treatment.   Orders entered.   Comments: [name] has a h/o depression. She was brought to ER after threatening a suicide in the context of treatment noncompliance. She has not picked up any medications frtom her pharmacy since September 2013.Her family complains that the patient has been incontionrnt of stool and urine.  She has been compliant with medications here. Her sleep and appetite has improved. [facility]   PLAN: 1. The patient is on IVC.   2. We will continue Remeron for depression and Ambien for sleep.   3. We will continue Protonix.   4. Constipation-bowel regimen and MOM as needed.    5. The patient was referred and rejected by all Geropsychiatry Units in [place].   6. [facility].  Electronic Signatures: Kristine LineaPucilowska, Jolanta (MD)  (Signed 31-Jan-14 20:37)  Authored: Brief Consult Note   Last Updated: 31-Jan-14 20:37 by Kristine LineaPucilowska, Jolanta (MD)

## 2014-11-10 NOTE — Consult Note (Signed)
Brief Consult Note: Diagnosis: Major Depressive Disorder.   Patient was seen by consultant.   Recommend further assessment or treatment.   Orders entered.   Comments: Pt continues to stay in bed and has been refusing her personal grooming and hygiene. She is taking Ambien at night which might be making her more lethargic.  She is taking Remeron for depression and her appetite is good.  [facility].  Plan: Will D/C Ambien  Continue Remeron.  Start Trazodone 50 mg po QHS prn for insomnia.  Continue to monitor.  Electronic Signatures: Rhunette CroftFaheem, Itha Kroeker S (MD)  (Signed 30-Jan-14 10:08)  Authored: Brief Consult Note   Last Updated: 30-Jan-14 10:08 by Rhunette CroftFaheem, Tyshauna Finkbiner S (MD)

## 2014-11-12 NOTE — Consult Note (Signed)
Present Illness 74 year old female with known cardiovascular risk factors including hypertension, chronic obstructive pulmonary disease hyperlipidemia, which had been under reasonable control at this time, not requiring further intervention.  The patient has had a recent significant depression, for which she may need further treatment options.  She has had a fall down her steps for which it is not clear whether it was syncope or true fall.  As she fell down the steps.  She ended up with chest discomfort of unknown etiology.  This chest discomfort and other discomfort was likely secondary to the fall and not a myocardial infarction.  She does have an EKG showing normal sinus rhythm with diffuse T-wave inversions possibly due to some head injury.  Troponin was elevated at 0.59, more consistent with current injury rather than acute myocardial infarction.  The patient does have some valvular heart disease with murmurs but has not significantly been previously diagnosed.  The patient now feels well without evidence of congestive heart failure or true angina  Family history No family members with early onset of cardiovascular disease  Social history Patient currently denies alcohol or tobacco use   Physical Exam:   GEN WD    HEENT pink conjunctivae    NECK No masses    RESP normal resp effort  crackles    CARD Regular rate and rhythm  Murmur    Murmur Systolic    Systolic Murmur Out flow    ABD denies tenderness  soft    LYMPH negative neck    EXTR negative cyanosis/clubbing    SKIN No rashes    NEURO cranial nerves intact    PSYCH alert   Review of Systems:   Subjective/Chief Complaint I fell down the steps and hurt myself    Cardiovascular: Chest pain or discomfort    ROS Pt not able to provide ROS    Medications/Allergies Reviewed Medications/Allergies reviewed     Chest Pain: 24-Aug-2011   Anemia:    ulcer disease:    COPD:    hypothyroidism:    anxiety:     brochitis:    Depression:    cataracts:        Admit Diagnosis:   CHEST PAIN: 25-Aug-2011, Active, CHEST PAIN      Admit Reason:   Chest pain: (786.50) 24-Aug-2011, Active, ICD9, Unspecified chest pain   Chest pain: (786.50) Active, ICD9, Unspecified chest pain   Chest pain: (786.50) Active, ICD9, Unspecified chest pain  Home Medications:  Protonix 40 mg oral delayed release tablet: 1 tab(s) orally 2 times a day, Active  Calcium 600+D 600 mg-200 intl units oral tablet: 1 tab(s) orally 2 times a day, Active  ferrous sulfate 325 mg (65 mg elemental iron) oral tablet: 1 tab(s) orally 2 times a day, Active  Remeron 30 mg oral tablet: 1 tab(s) orally once a day (at bedtime), Active  Ambien 10 mg oral tablet: 1 tab(s) orally once a day (at bedtime), Active  magnesium chloride SA: 64 milligram(s) orally once a day, Active  calcitonin nasal spray: 1 spray(s) nasal once a day, Active  levothyroxine 50 mcg (0.05 mg) oral tablet: 1 tab(s) orally once a day, Active  Routine Chem:  03-Feb-13 04:28    Glucose, Serum 105   BUN 16   Creatinine (comp) 0.54   Sodium, Serum 143   Potassium, Serum 3.8   Chloride, Serum 108   CO2, Serum 24   Calcium (Total), Serum 8.9  Hepatic:  03-Feb-13 04:28    Bilirubin,  Total 0.3   Alkaline Phosphatase 61   SGPT (ALT) 21   SGOT (AST) 31   Total Protein, Serum 6.8   Albumin, Serum 3.3  Routine Chem:  03-Feb-13 04:28    Osmolality (calc) 287   eGFR (African American) >60   eGFR (Non-African American) >60   Anion Gap 11  Routine Hem:  03-Feb-13 04:28    WBC (CBC) 10.0   RBC (CBC) 4.24   Hemoglobin (CBC) 11.6   Hematocrit (CBC) 36.5   Platelet Count (CBC) 282   MCV 86   MCH 27.4   MCHC 31.9   RDW 19.4  Routine Chem:  03-Feb-13 04:28    Magnesium, Serum 2.1  Cardiac:  03-Feb-13 04:28    Troponin I 0.59  Routine UA:  03-Feb-13 04:28    Color (UA) Straw   Clarity (UA) Clear   Glucose (UA) Negative   Bilirubin (UA) Negative    Ketones (UA) Negative   Specific Gravity (UA) 1.008   Blood (UA) Negative   pH (UA) 7.0   Protein (UA) Negative   Nitrite (UA) Negative   Leukocyte Esterase (UA) Negative   RBC (UA) <1 /HPF   WBC (UA) 1 /HPF   Mucous (UA) PRESENT   Amorphous Crystal (UA) PRESENT  Cardiac:  03-Feb-13 14:32    Troponin I 0.25   CK, Total 33   CPK-MB, Serum 1.1    20:52    Troponin I 0.18   CK, Total 31   CPK-MB, Serum 0.9  Routine Chem:  04-Feb-13 03:05    Glucose, Serum 79   BUN 9   Creatinine (comp) 0.44   Sodium, Serum 146   Potassium, Serum 3.2   Chloride, Serum 111   CO2, Serum 24   Calcium (Total), Serum 8.4  Hepatic:  04-Feb-13 03:05    Bilirubin, Total 0.2   Alkaline Phosphatase 51   SGPT (ALT) 15   SGOT (AST) 17   Total Protein, Serum 5.8   Albumin, Serum 2.6  Routine Chem:  04-Feb-13 03:05    Osmolality (calc) 288   eGFR (African American) >60   eGFR (Non-African American) >60   Anion Gap 11  Routine Hem:  04-Feb-13 03:05    WBC (CBC) 9.8   RBC (CBC) 3.60   Hemoglobin (CBC) 9.8   Hematocrit (CBC) 31.3   Platelet Count (CBC) 249   MCV 87   MCH 27.3   MCHC 31.4   RDW 19.0   Neutrophil % 68.9   Lymphocyte % 18.5   Monocyte % 11.2   Eosinophil % 1.4   Basophil % 0.0   Neutrophil # 6.7   Lymphocyte # 1.8   Monocyte # 1.1   Eosinophil # 0.1   Basophil # 0.0  Routine Chem:  04-Feb-13 03:05    Cholesterol, Serum 158   Triglycerides, Serum 61   HDL (INHOUSE) 64   VLDL Cholesterol Calculated 12   LDL Cholesterol Calculated 82  Thyroid:  04-Feb-13 03:05    Thyroid Stimulating Hormone 0.959   EKG:   EKG Interp. by me    Interpretation normal sinus rhythm with diffuse T wave inversions    Naprosyn: Other  Sulfa drugs: Other  Phenergan: Alt Ment Status  Codeine: Unknown  Vital Signs/Nurse's Notes: **Vital Signs.:   04-Feb-13 05:05   Vital Signs Type Routine   Temperature Temperature (F) 98.5   Celsius 36.9   Temperature Source oral   Pulse  Pulse 81   Pulse source per Dinamap   Respirations  Respirations 18   Systolic BP Systolic BP 194   Diastolic BP (mmHg) Diastolic BP (mmHg) 60   Mean BP 79   BP Source Dinamap   Pulse Ox % Pulse Ox % 91   Pulse Ox Activity Level  At rest   Oxygen Delivery Room Air/ 21 %     Impression 74 year old female with known previous significant depression, hyperlipidemia, hypertension, COPD with acute fall down the steps and injury with chest pain most consistent with the fall rather than true angina and an elevated troponin as well as abnormal EKG, most consistent with fall rather than myocardial infarction area.  There is no evidence of telemetry disturbances suggesting cause of fall    Plan 1.  Continue serial ECG and enzymes to assess for possible myocardial infarction.  Over the next several hours. 2.  Echocardiogram for valvular heart disease which may have contributed to current issues. 3.  Follow for hypertension and an additional occasions, including the possibility of beta blocker as necessary, watching closely for side effects. 4.  Further evaluation of other injuries at that may have occurred with the fall including hip injury causing disability. 5.  Telemetry for rhythm disturbances that may be contributing to above. 6.  Possible further treatment of depression after diagnostics and assessment of her 24 hours with above   Electronic Signatures: Corey Skains (MD)  (Signed 04-Feb-13 14:31)  Authored: General Aspect/Present Illness, History and Physical Exam, Review of System, Past Medical History, Health Issues, Home Medications, Labs, EKG , Allergies, Vital Signs/Nurse's Notes, Impression/Plan   Last Updated: 04-Feb-13 14:31 by Corey Skains (MD)

## 2014-11-12 NOTE — H&P (Signed)
PATIENT NAME:  Jackie Garcia, Jackie Garcia MR#:  [id number] DATE OF BIRTH:  1940-12-06  DATE OF ADMISSION:  08/08/2011  IDENTIFYING INFORMATION AND CHIEF COMPLAINT: The patient is a 74 year old woman brought in urgently after taking an overdose of Klonopin.   CHIEF COMPLAINT: "I wanted to die".  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 She has had multiple major stresses in her life. In November her sister, who was her closest living relative, passed away in front of the patient. This also took away one of the patient's main supports. She has been since then living with her daughter and the two of them don't seem to get along very well. Additionally, the patient had a long hospitalization in December to early January initially for treatment of her back pain but then developed a severe stomach ulcer bleed and actually CODED. Recently her depression has been getting worse and worse. Her mood stays depressed and down all of the time. She feels hopeless and feels like there is nothing to live for. She feels tired all the time. She is not eating well. She feels like it's too much work to eat. She continues to lose weight. Her sleep cycle is very chaotic with on and off napping through the day and night but no extended sleep. She denies any psychotic symptoms but says that she has had suicidal ideation. Yesterday it was an impulsive thing to take an overdose of Klonopin but she really did at the time wish that she would die. Stress yesterday was some kind of squabble regarding an automobile at home. The patient says that she has been compliant with her prescription medication.   PAST PSYCHIATRIC HISTORY: The patient has a long history of depression including a psychiatric hospitalization back in the 1970's. Recently she has been treated by Dr. Tonny BollmanHanson Su outpatient psychiatry. She has been on multiple antidepressant medications with only partial response at best.  Recently she has been on Remeron at either 15 or 30 mg at night. The patient says that she does not have a past history of other suicide attempts. She has not had a hospitalization in probably 30 years or more.    SUBSTANCE ABUSE HISTORY: Currently does not drink alcohol. Denies any drug use. Said she used to drink heavily in her "party days" which were back in the 1970's. Smokes about three cigarettes a day.   SOCIAL HISTORY: The patient lives with her adult daughter. It sounds like the two of them don't get along entirely perfectly. She has several other adult children with whom it sounds like she probably gets along even worse. She complains that her daughter has not had "time to take care of me". I do not have any information from the daughter yet so I really don't know the full situation. The patient used to live with her sister until the sister died in November. This was very traumatic and also resulted in a big disruption to the patient's life. She says that her days mostly consist of sitting in bed, maybe getting up and making her way to the chair and that is about it.   FAMILY HISTORY: Positive for depression.   MEDICAL HISTORY: The patient has chronic back pain. She had a recent peptic ulcer that bled back in December causing her to have a CODE BLUE. She has recovered from that but is still anemic. She has hypothyroidism, chronic arthritis and back pain.   CURRENT MEDICINES:  1. Calcitonin spray  1 puff each nostril a day.  2. Calcium and Vitamin D twice a day.  3. Synthroid 50 mcg a day.  4. Remeron either 15 or 30 mg at night.  5. Protonix 40 mg twice a day.  6. Ambien 5 mg at night.  7. Slow-Mag 64 mg a day. 8. Ferrous sulfate 325 mg twice a day.   ALLERGIES: She is allergic to Naprosyn, Phenergan, and sulfa drugs.   REVIEW OF SYSTEMS: Complains of fatigue, depression, chronic back pain. Denies hallucinations. Does not have acute suicidal intent but still feels hopeless. Otherwise,  noncontributory. She does say that she has some nausea chronically but has not been vomiting. She is constipated.   MENTAL STATUS EXAMINATION: The patient was interviewed in the Emergency Room. She was cooperative but passive. Eye contact was decreased. Psychomotor activity is very limited. Speech is quiet but easy to understand, slow and decreased in total production. Affect is flat. Mood is stated as depressed. Thoughts are lucid and directed. No evidence of bizarre or delusional thinking. Denies hallucinations. Denies any acute suicidal intent but still feels hopeless and wishes she were dead. No homicidal ideation. Judgment and insight somewhat impaired.   PHYSICAL EXAMINATION:   GENERAL: Frail-looking elderly woman who looks like she has lost some weight.   VITAL SIGNS: Her most recent vital signs show a pulse of 69, respirations 18, blood pressure 115/58, temperature 97.3.   SKIN: The patient has no acute new skin lesions identified.   HEENT: Pupils were equal and reactive although with decreased reactivity because of a history of cataract surgery. Vision is declared as being poor although she does not seem to wear glasses.   NEUROLOGICAL: Cranial nerves are symmetric and normal throughout. Strength is decreased from effort throughout but symmetric. Range of motion is limited at all joints especially at the hip from arthritis. Back is very limited in mobility and she stays stiff when she walks. Her gait is marked by general overall stiffness and an appearance of being in pain.   LUNGS: Clear to auscultation with no extra sounds.   HEART: Regular rate and rhythm with no extra sounds.   ABDOMEN: Soft and nontender. Normal bowel sounds.   ASSESSMENT: This is a 74 year old woman with severe major depression unresponsive to recent medication, also multiple medical problems, multiple major life stresses, who made a serious suicide attempt. Health is not doing well. Not eating well. She needs  inpatient treatment and to be admitted to the hospital.   TREATMENT PLAN:  1. I'm going to admit her to the hospital on her current medication but put the Remeron up to 30 mg at night.  2. She will be included in groups and activities on the unit.  3. Labs will be reviewed.  4. Supportive and cognitive therapy done daily.  5. We will discuss appropriate treatment planning. I think there may be some consideration for ECT but I have not brought it up with her yet. She has failed multiple medications.   DIAGNOSES PRINCIPLE AND PRIMARY:  AXIS I: Major depressive episode, severe, recurrent.   SECONDARY DIAGNOSES:  AXIS I: No further diagnosis.   AXIS II: No diagnosis.   AXIS III:  1. Anemia.  2. Status post ulcer bleed. 3. Arthritis. 4. Osteoporosis. 5. Chronic pain.   6. Hypothyroidism.   AXIS IV: Severe from recent losses and disruptions to her life.   AXIS V: Functioning at time of admission 20.   ____________________________ Audery Amel, MD jtc:drc D: 08/08/2011  11:44:54 ET T: 08/08/2011 11:55:31 ET JOB#: [id number]  cc: Audery Amel, MD, <Dictator> Audery Amel MD ELECTRONICALLY SIGNED 08/08/2011 13:12

## 2014-11-12 NOTE — H&P (Signed)
PATIENT NAME:  Jackie HolmesHENDERSON, Jackie Garcia MR#:  [id number] DATE OF BIRTH:  Dec 15, 1940  DATE OF ADMISSION:  08/24/2011  HISTORY OF PRESENT ILLNESS:  A 74 year old female who presents with pain post fall as well as chest pain. She fell down one step she reports yesterday. She has generalized pain problems with significant depression, recent admission on the behavioral unit for depression. She comes in post fall. CT of the chest, abdomen and pelvis are normal. Left wrists films are normal. She notes some chest heaviness and pressure. She has really no strong history of coronary artery disease. Initial troponins are slightly positive. She is really unable to get out of bed post fall. She will be admitted for further evaluation and treatment. Other than her chest pain, back pain and left arm pain, she has really no focal complaints. No abdominal pain, no fever or chills, no urinary symptoms. She will be admitted for evaluation of her abnormal troponins. She also has a recent history of gastrointestinal bleed.   PAST MEDICAL HISTORY:  1. Depression.  2. Hypertension.  3. Anxiety.  4. Gastric ulcer. 5. Chronic obstructive pulmonary disease. 6. Anemia.   ALLERGIES: Sulfa, codeine.   MEDICATIONS:  1. Calcitonin one spray daily. 2. Iron fumarate 325 mg daily.  3. Protonix 40 mg daily.  4. Ambien 10 mg at bedtime p.r.n.  5. Remeron 45 mg at bedtime.  6. Synthroid 50 mcg daily.   SOCIAL HISTORY: She lives with her granddaughter.   FAMILY HISTORY: Noncontributory.   REVIEW OF SYSTEMS: Otherwise negative.   PHYSICAL EXAMINATION:  VITAL SIGNS: Blood pressure 120/70, pulse 80.   HEENT: Benign. No blood in her TMs.   NECK: Mildly limited range of motion.   LUNGS: Clear.   LEFT ARM: Pain with touching, but moves reasonably well. No purpura. Good distal pulses.   HEART: Regular rhythm. No audible murmur.   ABDOMEN: Soft and nontender.   EXTREMITIES: No edema.   NEUROLOGIC: Grossly nonfocal  although generally weak.   ASSESSMENT AND PLAN:  1. Chest pain/abnormal troponins. Will cycle.  2. Post fall. No sign of fracture on complete scanning with left wrist x-rays negative as well. Will use low-dose Percocet. Give her one dose of Solu-Medrol for the inflammation. Get physical therapy involved. Ultimately may need placements.  3. Anxiety/depression. Remeron, will continue.   OVERALL PROGNOSIS: Guarded.  ____________________________ Danella PentonMark F. Askari Kinley, MD mfm:ap D: 08/24/2011 07:45:55 ET T: 08/24/2011 10:00:58 ET JOB#: [id number]  cc: Danella PentonMark F. Keelon Zurn, MD, <Dictator> Danella PentonMARK F Naheim Burgen MD ELECTRONICALLY SIGNED 08/27/2011 8:11

## 2014-11-12 NOTE — Consult Note (Signed)
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   HISTORY OF PRESENT ILLNESS: This is a 74 year old white female who is apparently on her fourth admission in the last two months for failure to thrive due to severe depression. She has had a fall yesterday. She has generalized pain problem and severe depression, had a recent admission to the Behavioral Medicine Unit. She has complained of some chest pain and left wrist pain. Her wrist films were normal. 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 On close questioning, she does not really have left arm claudication or any vertebrobasilar symptoms. On review of her old records, she had a carotid duplex performed in December of last year which showed mild atherosclerotic changes with no significant stenosis. She has not had a clear stroke or TIA and has no ischemic ulcers or infection of the left hand.   PAST MEDICAL HISTORY:  1. Severe depression.  2. Hypertension.  3. Gastric ulcer.  4. Chronic obstructive pulmonary disease.  5. Anemia.   ALLERGIES: Sulfa and codeine.   HOME MEDICATIONS:  1. Calcitonin one spray daily.  2. Iron fumarate 325 daily.  3. Protonix 40 mg daily.  4. Ambien 10 mg at bedtime as needed.  5. Remeron 45 mg daily.  6. Synthroid 50 mcg daily.   SOCIAL HISTORY: She apparently lives with her granddaughter. No alcohol abuse or IV drug use.   FAMILY HISTORY: Noncontributory to present illness.  REVIEW OF SYSTEMS: No fevers or chills. Positive for generalized malaise. No intentional weight loss or gain. EYES: No blurry or double vision. EARS: No tinnitus or ear pain. CARDIAC: Some  chest pain which is being worked up. She does have some mildly elevated troponin but this is thought to likely be musculoskeletal after her fall. RESPIRATORY: Chronic COPD. No acute worsening. GI: No nausea, vomiting, diarrhea. GU: No dysuria or hematuria. ENDOCRINE: No heat or cold intolerance. PSYCH: As recorded above in HPI, severe depression. MUSCULOSKELETAL: Left wrist pain after her fall. NEUROLOGIC: No transient ischemic attack, stroke, or seizure. SKIN: No new rash or ulcers.   PHYSICAL EXAMINATION:   GENERAL: This is an elderly white female not in apparent distress lying in her bed.   VITAL SIGNS: Temperature 98.1, pulse 83, blood pressure 127/71.   HEAD: Normocephalic and atraumatic. Sclerae nonicteric. Conjunctivae are clear.   EARS: Normal external appearance. Hearing appears intact.   NECK: Neck is supple without adenopathy or jugular venous distention. Carotids have good upstroke without bruits.   HEART: Regular rate and rhythm without murmur.   LUNGS: Clear to auscultation bilaterally.   ABDOMEN: Soft, nondistended, nontender.   EXTREMITIES: She has some mild bruising in her left wrist. No significant cyanosis, clubbing, or edema. She has 2+ radial pulses which are equal bilaterally.  LABORATORY, DIAGNOSTIC, AND RADIOLOGICAL DATA: Imaging is as described above. Sodium 146, potassium 3.2, chloride 111, CO2 24, BUN 9, creatinine 0.44, glucose 79, white blood cell count 9.8, hemoglobin 9.8, platelet count 249,000.   ASSESSMENT AND PLAN: This is a 74 year old white female with what appears to be a moderate worse left subclavian artery stenosis. This appears asymptomatic. There is not associated  carotid artery disease which is worrisome. I do not think she has any major problems or symptoms from this. I do not think her left wrist pain is at all related to malperfusion. I think this is from her fall. I asked the nursing staff to check blood pressures in each arm, which were equal  without gradient, so this lesion is likely not hemodynamically significant. I would not recommend any further evaluation or work-up for this at this time. Should she develop any symptoms of left arm claudication, rest pain in the left hand, or tissue loss, we would be happy to see her in follow-up as an outpatient, although I think this is highly unlikely.  This is a Airline pilotLevel-4 consultation.   ____________________________ Annice NeedyJason S. Attie Nawabi, MD jsd:drc D: 08/25/2011 15:52:19 ET T: 08/26/2011 07:14:20 ET JOB#: [id number]  cc: Annice NeedyJason S. Kagan Hietpas, MD, <Dictator> Annice NeedyJASON S Daniyal Tabor MD ELECTRONICALLY SIGNED 09/15/2011 15:46

## 2014-11-12 NOTE — Consult Note (Signed)
Brief Consult Note: Diagnosis: Major depression.   Patient was seen by consultant.   Recommend further assessment or treatment.   Orders entered.   Discussed with Attending MD.   Comments: ECT: PAtient seen. Briefly, patient has major depression and has failed mare than one medication trial. Has passive SI and hopelessness. Severely impaired from illness. She was counceled on nature of ECT including potential risks and benefits and is agreeable to treatment. We will hope to start monday. Orders for transfer to my service and lab tests. ECT nurse notified.  Electronic Signatures: Audery Amellapacs, Teyla Skidgel T (MD)  (Signed 22-Mar-13 14:48)  Authored: Brief Consult Note   Last Updated: 22-Mar-13 14:48 by Audery Amellapacs, Erian Rosengren T (MD)

## 2014-11-12 NOTE — Discharge Summary (Signed)
PATIENT NAME:  Jackie Garcia, Jackie Garcia MR#:  [id number] DATE OF BIRTH:  Feb 23, 1941  DATE OF ADMISSION:  10/06/2011 DATE OF DISCHARGE:  10/08/2011  MEDICATIONS AT DISCHARGE:  1. Levothyroxine 50 mcg daily.  2. Calcitonin nasal spray one spray per nostril per day. 3. Tylenol 500 mg 1 to 2 q.6 hours for moderate pain. 4. Senokot-S daily 1 tablet twice a day for constipation.  5. Iron 325 mg 1 tablet daily. 6. Protonix 40 mg daily.  7. Vitamin B12 1000 mcg tablet daily. 8. Ultram 50 mg every six hours for moderate pain. 9. Calcium 600 mg with 200 units of vitamin D twice a day.  10. Psychiatric medicines per psychiatrist. Has been on Remeron at the time of admission of 45 mg at bedtime.  11. She has a DuoDERM patch to her decubitus ulcer in the buttocks area. This is to be changed every three days and/or as per wound clinic suggestions.   ____________________________ Jimmie Mollyon C. Candelaria Stagershaplin, MD dcc:cms D: 10/08/2011 07:55:32 ET T: 10/08/2011 08:15:47 ET JOB#: [id number]  cc: Andrews Tener C. Candelaria Stagershaplin, MD, <Dictator>  Virl AxeN C Corbitt Cloke MD ELECTRONICALLY SIGNED 10/10/2011 6:14

## 2014-11-12 NOTE — Consult Note (Signed)
Brief Consult Note: Diagnosis: major depression.   Patient was seen by consultant.   Recommend further assessment or treatment.   Orders entered.   Comments: Psychiatry: PAtient seen. Chart reviewed. Patient made suicide attempt. [facility] Orders done, patient informed.  Electronic Signatures: Audery Amellapacs, Thadius Smisek T (MD)  (Signed 18-Jan-13 11:35)  Authored: Brief Consult Note   Last Updated: 18-Jan-13 11:35 by Audery Amellapacs, Sonu Kruckenberg T (MD)

## 2014-11-12 NOTE — Discharge Summary (Signed)
PATIENT NAME:  Jackie HolmesHENDERSON, Chesney K MR#:  [id number] DATE OF BIRTH:  1941/06/11  DATE OF ADMISSION:  10/08/2011 DATE OF DISCHARGE:  10/24/2011  HOSPITAL COURSE: See dictated history and physical for details of admission. This 74 year old woman with a history of recurrent episodes of major depression came back onto the psychiatry service again with suicidal ideation and depression with failure to thrive, failure to function out of the hospital, medical problems getting worse. Hopelessness prominent. She was originally admitted to Dr. Providence CrosbyWilliford's service, but he consulted me for ECT early in her hospital stay. I saw the patient and agreed that she was an appropriate ECT candidate. She was started, with her consent, on ECT near the end of March 2013. The patient was given right unilateral ECT treatment, which she tolerated well. She did frequently complain of headaches after the treatment but did not have any confusion or delirium and did not notice any significant memory problems. Medically she tolerated the treatment well. Meanwhile, she was continued on her psychiatric medicines of Remeron and trazodone and was continued on Ambien 5 mg to sleep at night as well as her medical medications. The patient appeared to respond well to ECT. She received a total of five treatments before being discharged. The patient herself was ambivalent about how much improvement she showed, but for myself and the staff working with her she clearly seemed to improve. Her energy became much greater. She was dressing herself regularly and taking care of her hygiene. Getting out of bed, interacting with people, going to groups and was much more verbal. She was not reporting any suicidal ideation and was reporting looking forward to being with her family again. We worked with her on discharge planning and suggested that she try a family care home rather than returning to stay with her granddaughter given her previous complaints and that  this has not been working out. She consented to go visit a family care home the day prior to discharge, but the patient ultimately decided she would rather live with her granddaughter and daughter. She was discharged on 10/24/2011. At this point, I have strongly recommended to her that we continue maintenance ECT at least for a couple more treatments and then discuss a longer term maintenance schedule after that. I am hoping that she will follow through. At this point, she was at least tentatively saying that she would be here Monday for treatment. She will also be followed up outpatient by her regular psychiatrist for medication management. The patient did not engage in any suicidal behavior in the hospital and was not reporting any suicidal ideation or hopelessness at the time of discharge.   DISCHARGE MEDICATIONS:  1. Ambien 5 mg at night for sleep p.r.n.  2. Albuterol inhaler 2 puffs every six hours as needed for shortness of breath with an Opti-Chamber.   3. Iron 325 mg per day.  4. Trazodone 150 mg at night.  5. Remeron 45 mg at night.  6. Calcium with vitamin D 500 mg twice a day.  7. Protonix 40 mg a day.  8. Cyanocobalamin 1000 mg per day.  9. Calcitonin nasal spray one spray to alternating nostrils daily.  10. Synthroid 50 mcg per a.m.   LABS/STUDIES: An EKG done prior to ECT showed normal sinus rhythm, nonspecific T wave abnormality.   Chest x-ray was unremarkable.   Urinalysis was clear.   Chemistry panel showed a slightly low albumin, probably not significant. TSH was a little elevated at 4.6. Blood count  showed a low MCH, just slightly. Normal hemoglobin and hematocrit. Chemistry panel unremarkable.   MENTAL STATUS EXAM AT DISCHARGE: Elderly woman who does not appear in any acute distress. Eye contact normal. Psychomotor activity a little bit decreased, but better than it used to be. Speech normal tone and rate. Still has some decrease in total production, but it is improved over  what it was before. Thoughts appear to be lucid and organized. No evidence of thought disorder or loosening of associations. Denies suicidal or homicidal ideation. Denies hallucinations. Improved insight and judgment.   DISPOSITION: The patient is to be discharged to live with her granddaughter and arrangements are made for her to return for outpatient ECT on Monday morning with follow-up to be determined.   DIAGNOSIS PRINCIPLE AND PRIMARY:   AXIS I: Major depressive episode, severe, recurrent.   SECONDARY DIAGNOSES:   AXIS I: No further.   AXIS II: No diagnosis.   AXIS III: Hypothyroid, low weight, osteoporosis, recent fall with fracture to hip now repaired, and pressure sore on her lower back which is still healing.   AXIS IV: Moderate to severe from still having had several major losses and recovering from a depression and being dislocated from her home.   AXIS V: Functioning at time of discharge 54.  ____________________________ Audery Amel, MD jtc:slb D: 10/24/2011 16:59:00 ET T: 10/27/2011 12:17:31 ET JOB#: [id number]  cc: Audery Amel, MD, <Dictator> Audery Amel MD ELECTRONICALLY SIGNED 10/27/2011 14:55

## 2014-11-12 NOTE — Consult Note (Signed)
Asked to see patient regarding left subclavian lesion seen on CT scan of C/A/P at admission.  I have reviewed this study and the lesion appears to be 50-60% at the worst.  She does not have true left arm claudication or vertebrobasilar symptoms, and her left radial pulse in normal.  She had a carotid duplex done in Dec 2012 showing minimal plaque and no hemodynamically significant stenosis.  This isolated lesion would not require any intervention as it is not clearly symptomatic and there is no associated significant carotid disease.  Would use right arm blood pressures as true BP readings, although I suspect the gradient is low.  Will ask nursing to check.  No other recs at this time.    Electronic Signatures: Annice Needyew, Albertine Lafoy S (MD)  (Signed on 04-Feb-13 11:22)  Authored  Last Updated: 04-Feb-13 11:22 by Annice Needyew, Nalayah Hitt S (MD)

## 2014-11-12 NOTE — Op Note (Signed)
PATIENT NAME:  Jackie Garcia, [name] MR#:  [id number] DATE OF BIRTH:  11/17/1940  DATE OF PROCEDURE:  08/26/2011  PREOPERATIVE DIAGNOSIS: Impacted femoral neck fracture, left hip.   POSTOPERATIVE DIAGNOSIS: Impacted femoral neck fracture, left hip.   PROCEDURE: Percutaneous pinning of left hip.   SURGEON: Winn JockJames C. Camielle Sizer, M.D.   ASSISTANT: None.   ANESTHESIA: Spinal.   ESTIMATED BLOOD LOSS: 25 mL.  REPLACEMENT: 1 liter crystalloid.   DRAINS: None.   COMPLICATIONS: None.   IMPLANTS USED: Cannulated 7.3 mm screws from [employer] x3.  BRIEF CLINICAL NOTE AND PATHOLOGY: The patient suffered the above-mentioned fracture. She was admitted to the hospital, on the Medicine service, and cleared medically for surgery. Options, risks, and benefits were discussed in detail with the patient, particularly including nonunion and malunion. The patient also understands the possibility and need for further surgery.   At the time of the procedure, hardware had good fixation.   DESCRIPTION OF PROCEDURE: Preop antibiotics, adequate spinal anesthesia, the patient transferred to the fracture table, all prominences well padded, right leg placed in the well legholder. AP and lateral fluoroscopic views were obtained and the fracture was appropriately aligned. It was then instrumented in routine fashion beginning with a small lateral incision, after prepping and draping. Guidewires were placed up the neck into the head in a reverse triangle configuration using fluoroscopic guidance. AP and lateral views showed good positioning. Lengths were measured, screws were then inserted. Fixation was very good. There was no evidence of femoral head penetration. AP, lateral, and oblique views confirmed this. The incision was thoroughly irrigated, hemostasis was good. Subcutaneous was closed with 0 Vicryl, skin closed with staples. A soft sterile dressing was applied. The patient was awakened and taken to the PAC-U having tolerated  the procedure well. Sponge, instrument, and needle counts were reported as correct prior to and after wound closure. ____________________________ Winn JockJames C. Gerrit Heckaliff, MD jcc:slb D: 08/26/2011 15:07:53 ET T: 08/26/2011 15:59:25 ET JOB#: [id number]  cc: Winn JockJames C. Gerrit Heckaliff, MD, <Dictator> Winn JockJAMES C Pheonix Wisby MD ELECTRONICALLY SIGNED 09/02/2011 19:33

## 2014-11-12 NOTE — Consult Note (Signed)
Brief Consult Note: Diagnosis: Left femoral neck fracture.   Patient was seen by consultant.   Consult note dictated.   Recommend to proceed with surgery or procedure.   Comments: Plan percutaneous pinning of left femoral neck fracture after Cardiology clearance. The risks and benefits of surgical intervention were discussed in detail with the patient. The patient expressed understanding of the risks and benefits and agreed with plans for surgery.  Surgical site signed as per "right site surgery" protocol.  Electronic Signatures: Donato HeinzHooten, James P (MD)  (Signed [phone]-Feb-13 20:48)  Authored: Brief Consult Note   Last Updated: 04-Feb-13 20:48 by Donato HeinzHooten, James P (MD)

## 2014-11-12 NOTE — Discharge Summary (Signed)
PATIENT NAME:  Jackie HolmesHENDERSON, [name] MR#:  [id number] DATE OF BIRTH:  02-22-1941  DATE OF ADMISSION:  08/24/2011 DATE OF DISCHARGE:  08/28/2011  HISTORY OF PRESENT ILLNESS/HOSPITAL COURSE: [name] is a 74 year old female who fell at home creating a fracture to her hip. Patient has been undergoing home health care for generalized weakness with home physical therapy after recently discharged from our behavior medicine service for severe acute on chronic depression. 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 Her GI and anemia remained stable and steadily improved with therapy over the last few months. Her generalized weakness has been problematic, was thought to be related to her chronic severe depression. Despite efforts in the hospital with depression management and general weakness with outpatient physical therapy she continued to be at risk. Prior to this admission multiple attempts had been attempted to get her insurance to help support a rehab program for her which was declined because of her acuteness was not felt to be significant to warrant inpatient physical therapy and rehab.   She underwent successful pinning of her hip. She has been progressing well in her rehab. She now would benefit from continued rehab for physical therapy and continuous support for emotion related to her depression.   She was seen in consultation by psychiatrist team which felt that she was stable and making progress on her current program. She was seen by the cardiologist because it was slight troponin bump at the time of admission. He felt this was noncardiac and related only to her acute trauma. She had no previous history suggestive of cardiac issues.    Her vital signs remained stable at 117/62. She was afebrile. Her oximetry was 98% despite her previous history of severe smoking disorder.   LABORATORY, DIAGNOSTIC AND RADIOLOGICAL DATA: Sugar 79, BUN 9, creatinine 0.44, sodium 146, potassium 3.2, chloride 111, albumin 2.6, protein 5.8 TSH normal 0.959. Hemoglobin steady at 9.8 to 9.4. White count 8700. She was A+, negative antibody screen. Last hemoglobin was 9.4 on 02/07.   DISCHARGE DIAGNOSES:  1. Acute fracture of the hip secondary to fall secondary to generalized weakness, ongoing chronic depression, relative severe.   2. Anemia secondary to blood loss secondary to previous peptic and gastric ulcer secondary to analgesic.  3. Osteoporosis.  4. Degenerative arthritis.  5. Spinal stenosis.   MEDICATIONS AT DISCHARGE:  1. Tylenol 500 to 1000 every eight hours p.r.n. for moderate pain.  2. Calcitonin nasal spray 1 puff per day. 3. Vitamin D 1000 units daily.  4. Senna 50/8.6 tablet b.i.d.  5. Lovenox subcutaneous until more ambulatory.  6. Iron 324 mg.  7. Ferrous fumarate daily.  8. Synthroid 0.05 mg daily.  9. Remeron 45 mg at bedtime. 10. Protonix 40 mg daily.  11. Potassium 20 mEq twice a day. 12. Ambien 10 mg p.r.n. at night for rest.  13. Ultram 50 mg every six hours p.r.n. for moderate pain.  14. Milk of magnesia 30 mL at bedtime p.r.n.  15. Glycerin suppositories at bedtime.   PROGNOSIS: Overall prognosis remains guarded.  ____________________________ Jimmie Mollyon C. Candelaria Stagershaplin, MD dcc:cms D: 08/28/2011 07:54:27 ET T: 08/28/2011 08:11:40 ET JOB#: [id number]  cc: Akshat Minehart C. Candelaria Stagershaplin, MD, <Dictator> Virl AxeN C Raphaela Cannaday MD ELECTRONICALLY SIGNED 08/29/2011 18:21

## 2014-11-12 NOTE — H&P (Signed)
PATIENT NAME:  Jackie Garcia, Jackie Garcia MR#:  [id number] DATE OF BIRTH:  [birth year]  DATE OF ADMISSION:  10/06/2011  PRIMARY CARE PHYSICIAN: Conchita Paris, MD   CHIEF COMPLAINT: "I could not breathe this a.m."   HISTORY OF PRESENT ILLNESS: This is a 74 year old female who states that she could not breathe this a.m. Normally she likes the room cold and her family had the room hot and she felt like she could not breathe. She thinks it may have been a panic attack but she also had chest pain, squeezing around her heart like a 50 pound weight. It lasted a few minutes and she had several times a day most of the time while she was lying down. It was associated with nausea. No vomiting. No diaphoresis. She complains of weakness. She states that she may have been taking her Remeron twice a day and last week she took an overdose of trazodone, the whole bottle, but vomited them up.   PAST MEDICAL HISTORY:  1. Depression. 2. Hypertension. 3. Anxiety. 4. Gastric ulcer. 5. Chronic obstructive pulmonary disease.  6. Anemia.  7. Hypothyroidism. 8. Osteoporosis.   PAST SURGICAL HISTORY:  1. Varicose veins. 2. Tubal ligation.  3. Cataracts.   ALLERGIES: Sulfa, codeine, and Naprosyn.   MEDICATIONS AS PER LAST DISCHARGE:  1. Tylenol p.r.n.  2. Calcitonin nasal spray one spray each nostril alternating daily.  3. Vitamin D 1000 international units daily.  4. Senna 50/8.6 b.i.d.  5. Iron 325 mg daily.  6. Synthroid 50 mcg daily.  7. Remeron 45 mg at bedtime.  8. Protonix 40 mg daily.  9. Potassium 20 mEq twice a day.   SOCIAL HISTORY: Lives with her family, daughter and granddaughter. Positive smoker 1 pack per day. No alcohol. No drug use.   FAMILY HISTORY: Mother died at 62 of lung cancer. Father died of throat cancer.  REVIEW OF SYSTEMS: CONSTITUTIONAL: Positive for skin burning and hot feeling. No fever, chills, or sweats. Positive for weight loss, 30 pounds. Positive for fatigue. EYES: She does wear  glasses. EARS, NOSE, MOUTH, AND THROAT: No hearing loss. No sore throat. No difficulty swallowing. Positive for hoarseness. CARDIOVASCULAR: Positive for chest pain. Positive for palpitations. RESPIRATORY: Positive for shortness breath. Positive for cough, clear phlegm. GASTROINTESTINAL: Positive for nausea. No vomiting. Positive for constipation. No bright red blood per rectum. No abdominal pain. GENITOURINARY: Positive for burning on urination. MUSCULOSKELETAL: Positive for joint pain. INTEGUMENTARY: Positive for bedsore on buttock. ENDOCRINE: Positive hypothyroidism. HEMATOLOGIC/LYMPHATIC: History of anemia.   PHYSICAL EXAMINATION:   VITAL SIGNS: Last vital signs include a temperature of 96.7, pulse 74, respirations 20, blood pressure 98/50, pulse oximetry 96%.   GENERAL: No respiratory distress.   EYES: Conjunctivae and lids normal. Pupils equal, round, and reactive to light. Extraocular muscles intact. No nystagmus.   EARS, NOSE, MOUTH, AND THROAT: Tympanic membrane no erythema. Nasal mucosa no erythema. Throat no erythema. No exudate seen. Lips and gums no lesions.   NECK: No JVD. No bruits. No lymphadenopathy. No thyromegaly. No thyroid nodules palpated.   RESPIRATORY: Lungs clear to auscultation. No use of accessory muscles to breathe. No rhonchi, rales, or wheeze heard.   CARDIOVASCULAR: S1, S2 normal. No gallops, rubs, or murmurs heard. Carotid upstroke 2+ bilaterally. No bruits. Dorsalis pedis pulses 2+ bilaterally. No edema of the lower extremities.   ABDOMEN: Soft, nontender. No organomegaly/splenomegaly. Normoactive bowel sounds. No masses felt.   LYMPHATIC: No lymph nodes in the neck.   MUSCULOSKELETAL: No clubbing, edema, or  cyanosis.   SKIN: Positive stage II decubitus on the buttock, size of just under a quarter.   NEUROLOGIC: Cranial nerves II through XII grossly intact. Deep tendon reflexes 2+ bilateral lower extremities.   PSYCHIATRIC: The patient is oriented to  person, place, and time.   LABORATORY, DIAGNOSTIC, AND RADIOLOGICAL DATA: CT scan of the chest for pulmonary embolism showed no pulmonary embolism. Lungs are clear. 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   Glucose 87, BUN 11, creatinine 0.62, sodium 145, potassium 3.7, chloride 108, CO2 23, calcium 9.2. Liver function tests normal range. White blood cell count 6.1, hemoglobin and hematocrit 12.8 and 40.0, platelet count 301. Troponin negative. TSH 1.72. D-dimer 3.02. TSH 1.75. Left hip showed previous open reduction and internal fixation. Pelvis no evidence of osseous abnormalities. Urinalysis negative. EKG showed normal sinus rhythm, flipped T waves laterally and inferiorly.   ASSESSMENT AND PLAN:  1. Chest pain with shortness of breath, could be anxiety related. Will admit as an observation. Get serial cardiac enzymes. Stress test in the morning.  2. Anxiety and depression with a suicide attempt, overdosed pills last week. Will have Psychiatry see the patient.  3. Chronic obstructive pulmonary disease. Respiratory status stable.   4. Gastroesophageal reflux disease. Continue Protonix.  5. Hypothyroidism. Continue Synthroid. TSH normal range.  6. Osteoporosis. Continue calcitonin and Vitamin D.  7. Tobacco abuse. Smoking cessation counseling done three minutes by me. Nicotine patch applied.  8. Decubitus ulcer. Watch closely. Ambulate. Will also get PT evaluation.  TIME SPENT ON DISCHARGE: 55 minutes.   CODE STATUS: FULL CODE.   ____________________________ Herschell Dimesichard J. Renae GlossWieting, MD rjw:drc D: 10/06/2011 20:21:16 ET T: 10/07/2011 06:13:21 ET JOB#: [id number]  cc: Herschell Dimesichard J. Renae GlossWieting, MD, <Dictator> Don C. Candelaria Stagershaplin, MD Salley ScarletICHARD J Mari Battaglia MD ELECTRONICALLY SIGNED 10/10/2011 17:50

## 2014-11-12 NOTE — Discharge Summary (Signed)
PATIENT NAME:  Jackie HolmesHENDERSON, Denae K MR#:  [id number] DATE OF BIRTH:  May 03, 1941  DATE OF ADMISSION:  08/08/2011 DATE OF DISCHARGE:  08/20/2011  HOSPITAL COURSE: See dictated the History and Physical for details of admission, but this 74 year old woman was admitted to through the emergency room after taking an intentional overdose of clonazepam. In the hospital, she was initially very withdrawn, sedated, and depressed. She did not show any significant problems with withdrawal from benzodiazepines. As her overdose wore off, she became more alert and oriented, but remained depressed. The patient was treated for depression with mirtazapine. She then told me that Cymbalta had been more effective for her in the past and was started on Cymbalta 60 mg a day and mirtazapine was decreased. However, at that point, she began to complain of GI symptoms with nausea, diarrhea, and abdominal pain. It was not clear what the cause of this was but one possibility was the Cymbalta. For that reason, Cymbalta was discontinued and she was started back on the Remeron at 45 mg at night. We briefly tried addition of BuSpar 5 mg three times daily, but the patient reported that it made her feel funny and requested it to be discontinued. Medically she had complained of severe toothache when she was admitted and a Medicine consult was obtained. They have recommended treatment with clindamycin 300 mg three times daily. After several days of this she also began to complain of nausea and diarrhea. Because of concern that she could develop Clostridium difficile or simply side effects of clindamycin that was discontinued. It was never clear what the source of her abdominal complaints were. Clostridium difficile toxin ultimately was negative. I suspect she may have had primarily a viral illness. She did not engage in any dangerous or suicidal behavior on the unit. She became stronger and did cooperate with physical therapy. She became somewhat more  ambulatory. Mood improved and she denied suicidal ideation. For the last couple of days of her hospital stay, she was primarily complaining of her physical complaints and weakness; however, this seemed to be improving. Arrangements were made for her to be discharged to stay with her granddaughter. Follow-up was to be arranged in the community with her outpatient psychiatrist.   DISCHARGE MEDICATIONS:  1. Calcitonin nasal spray one spray daily.  2. Calcium with vitamin D 1 tablet per day.  3. Iron 325 mg twice a day.  4. Synthroid 50 mcg per day.  5. Protonix 40 mg twice a day.  6. Ambien 10 mg at bedtime p.r.n. for sleep.  7. Colace 200 mg twice a day.  8. Remeron 45 mg at night.  LABORATORY RESULTS: Clostridium difficile toxin negative. CBC unremarkable. Urinalysis unremarkable.   DISPOSITION: Discharge home. Follow-up with arrangements to be made with psychiatry outpatient.   DISCHARGE MENTAL STATUS EXAMINATION: Elderly woman who appears somewhat sick and underweight. Eye contact is normal. Psychomotor activity is a little restrained. Speech is normal rate, tone, and volume. Affect is more upbeat and reactive. Mood is stated as better. Thoughts are lucid and directed. No evidence of thought disorder or loosening of associations. Denies hallucinations. Denies suicidal or homicidal ideation. Shows improved insight and judgment.   DIAGNOSIS PRINCIPLE AND PRIMARY:   AXIS I: Major depressive episode, recurrent, severe.   SECONDARY DIAGNOSES:   AXIS I: No further diagnosis.   AXIS II: Deferred.   AXIS III: Hypothyroid, underweight, history of recent gastrointestinal bleed, constipation, probable viral gastrointestinal illness.  AXIS IV: Severe - recent stress from loss of close family members and social dislocation.   AXIS V: Functioning at time of discharge 54. ____________________________ Audery Amel, MD jtc:slb D: 08/22/2011 00:17:06 ET T: 08/23/2011  15:40:37 ET JOB#: [id number]  cc: Audery Amel, MD, <Dictator> Audery Amel MD ELECTRONICALLY SIGNED 08/27/2011 10:43

## 2014-11-12 NOTE — H&P (Signed)
PATIENT NAME:  Jackie HolmesHENDERSON, [name] MR#:  [id number] DATE OF BIRTH:  12-15-1940  DATE OF ADMISSION:  10/09/2011  ADDENDUM:   PHYSICAL EXAMINATION: [name] is examined with female staff escort.  HEENT: Pupils are equally round and reactive to light and accommodation. Head normocephalic, atraumatic. Oropharynx clear without erythema. Hearing intact bilaterally to finger rub.   EXTREMITIES: No cyanosis, clubbing, or edema.   SKIN: Normal turgor. No rashes.   RESPIRATORY: Clear to auscultation, no wheezing, rhonchi or crackles.   CARDIOVASCULAR: Regular rate and rhythm. No murmurs, rubs, or gallops.   ABDOMEN: Bowel sounds positive, nontender.   GENITOURINARY: Deferred.   NEUROLOGIC: Cranial nerves II through XII are intact. Sensory is intact throughout to light touch. Motor 4 out of 5 strength throughout. Deep tendon reflexes normal strength and symmetry throughout. No Babinski. Coordination intact by finger-to-nose bilaterally. ____________________________ Adelene AmasJames S. Martesha Niedermeier, MD jsw:cbb D: 10/11/2011 20:40:14 ET T: 10/12/2011 09:05:25 ET JOB#: [id number]  cc: Adelene AmasJames S. Jhade Berko, MD, <Dictator> Lester CarolinaJAMES S Dewitt Judice MD ELECTRONICALLY SIGNED 10/13/2011 19:58

## 2014-11-12 NOTE — Discharge Summary (Signed)
PATIENT NAME:  [name], [name] MR#:  [id number] DATE OF BIRTH:  [birth year]  DATE OF ADMISSION:  10/06/2011 DATE OF DISCHARGE:  10/08/2011   HOSPITAL COURSE: [name] is a 74 year old female with severe history of chronic and intermittent depression. She had been [facility] in the last few [facility] for multiple medical issues, the last time requiring transfer to a rehab due to a fractured osteoporotic fracture. She had been at home for a few weeks from the rehab. She had not kept her follow-up appointments with her psychiatrist and she apparently had gotten quite weak and been bedridden and developed some chest discomfort and was brought to the Emergency Room where it was confirmed she had some significant weight loss. She had some slight changes in her EKG and she had a D-dimer elevation in which the CAT scan for pulmonary embolus was negative. She was admitted for further cardiac evaluation for possible unstable and unrecognized ischemic heart disease. She remained cardiovascularly stable. Her telemetry remained normal. Her cardiac enzymes were negative. She did have a Myoview study that had low probability for ischemic disease.   She was seen in consultation by her psychiatrist, Dr. Jeanie SewerWilliford, who felt that she did demonstrate significant depression with some suicidal tendencies including a history just prior to admission a few days with some ingestion of medications with an attempt to commit suicide. He recommended after being medically stable that she would be appropriate to be transferred and admitted to Behavior Medicine for further efforts to help with her severe depression. The patient in the hospital remained cardiovascularly stable. At the time of admission, she had two small decubitus on her buttocks area for her prolonged staying in bed since she had been discharged from rehab. DuoDERM was applied. Wound consult was requested but had not been completed at this dictation.  The patient's laboratory studies were extensive Electrolytes were stable, though a previous history several months ago with a significant gastric ulcer hemorrhage with cardiovascular collapse. This has all stabilized. Her hemoglobin is in the normal range. Electrolytes were normal. Urinalysis was clear and vital signs have been in the 123/62 range. Physical therapy felt she could benefit from some physical therapy and activities because of her prolonged decompensation due to bedrest.   The patient is being discharged with chest pain, noncardiac in origin, osteoporosis, previous history of gastric ulcer with previous GI bleed; this all appears to be stable, hypothyroidism on medication is stable, severe depression with anticipation of her being admitted to the Behavior Ward for additional treatment and improvement in her medication adjustments.   PROGNOSIS: Overall prognosis remains guarded.   ____________________________ Jimmie Mollyon C. Candelaria Stagershaplin, MD dcc:drc D: 10/08/2011 07:09:30 ET T: 10/08/2011 07:38:08 ET JOB#: [id number]  cc: Shanel Prazak C. Candelaria Stagershaplin, MD, <Dictator> Virl AxeN C Kadence Mikkelson MD ELECTRONICALLY SIGNED 10/10/2011 6:14

## 2014-11-12 NOTE — Consult Note (Signed)
PATIENT NAME:  Jackie Garcia, [name] MR#:  [id number] DATE OF BIRTH:  1940-09-25  DATE OF CONSULTATION:  08/25/2011  REFERRING PHYSICIAN:  Conchita Parison Chaplin, MD  CONSULTING PHYSICIAN:  Illene LabradorJames P. Angie FavaHooten Jr., MD  HISTORY OF PRESENT ILLNESS: The patient is a 74 year old female who apparently fell down one step on the day prior to admission and subsequently [facility] with complaints of generalized pain. She did have some pain with weightbearing activities. Initial work-up included CT of the chest, abdomen, and pelvis and x-rays of the left wrist. Subsequent x-rays demonstrated an impacted left femoral neck fracture.   PAST MEDICAL HISTORY:  1. Depression. 2. Hypertension.  3. Anxiety. 4. Gastric ulcer.  5. Chronic obstructive pulmonary disease. 6. Anemia. 7. GI bleed.   ALLERGIES: Sulfa and codeine.   MEDICATIONS AT THE TIME OF ADMISSION:  1. Calcitonin one spray daily. 2. Iron 325 mg daily.  3. Protonix 40 mg daily.  4. Ambien 10 mg at bedtime p.r.n.  5. Remeron 45 mg at bedtime.  6. Synthroid 50 mcg daily.   SOCIAL HISTORY: The patient apparently lives with her granddaughter. No tobacco or alcohol use.   FAMILY HISTORY: Noncontributory.    REVIEW OF SYSTEMS: Notable for some chest heaviness and pressure as well as generalized malaise, otherwise, essentially negative except as noted above.   PHYSICAL EXAMINATION:   GENERAL: Well developed, well nourished female seen in no acute distress. She exhibits a relatively blunt affect.   HEENT: Atraumatic, normocephalic. Extraocular motion intact. Oropharynx is clear.   NECK: Supple, nontender, and with reasonably good range of motion.   LUNGS: Clear to auscultation bilaterally.   CARDIAC: Regular rate and rhythm with normal S1, S2. No appreciable murmurs, gallops, or rubs.   EXTREMITIES: Good range of motion of the shoulders, elbows, and wrists. Some mild bruising is noted to the left forearm and wrist.    Examination of the left lower extremity demonstrates pain with attempted range of motion of the hip. No gross shortening or rotation. No knee effusion. No tenderness to palpation about the knee or ankle.   X-RAYS: Radiographs of the left hip demonstrated femoral neck fracture with relative valgus alignment. Some impaction is appreciated.   IMPRESSION: Left femoral neck fracture.   PLAN: Findings were discussed in detail with the patient. I would recommend percutaneous pinning in-situ. The patient expressed her understanding of the risks and benefits and elected to proceed with plans for surgical intervention.   ____________________________ Illene LabradorJames P. Angie FavaHooten Jr., MD jph:drc D: 08/26/2011 08:16:41 ET T: 08/26/2011 10:05:40 ET JOB#: [id number]  cc: Fayrene FearingJames P. Angie FavaHooten Jr., MD, <Dictator> JAMES P Angie FavaHOOTEN JR MD ELECTRONICALLY SIGNED 08/31/2011 9:25

## 2014-11-12 NOTE — Consult Note (Signed)
PATIENT NAME:  Jackie Garcia, [name] MR#:  [id number] DATE OF BIRTH:  1941/05/06  DATE OF CONSULTATION:  10/07/2011  REFERRING PHYSICIAN:  Conchita Parison Chaplin, MD CONSULTING PHYSICIAN:  Adelene AmasJames S. Corrie Brannen, MD  REASON FOR CONSULTATION:  Suicide attempt, depression.  HISTORY OF PRESENT ILLNESS: Jackie Garcia is a 74 year old female admitted to [facility] on 03/18 complaining of shortness of breath. She did acknowledge that she had overdosed with trazodone in an attempt to kill herself. This was three days before admission. She vomited those tablets up.  She has continued with over two weeks of depressed mood, poor energy, poor concentration, anhedonia, thoughts of hopelessness and helplessness, as well as suicidal ideation. She has no hallucinations or delusions.   Her orientation and memory function are intact. She cites the death of her sister in November as an ongoing stress and grief.  She also states that she has been thinking that she is a burden to her daughter.   PAST PSYCHIATRIC HISTORY: Jackie Garcia has suffered from several episodes of major depression. She first began experiencing major depression in the 1980s. She has undergone several psychiatric hospitalizations. She has made multiple suicide attempts. An overdose in the past with medication and alcohol precipitated an admission [facility].   She has undergone a number of medication trials including Cymbalta augmented with trazodone,  Celexa combined with Wellbutrin, Remeron, Lexapro, Zoloft, and venlafaxine.   In December 2012 she was seen on the general medical floor by the undersigned and was showing improvement in her depression at that time. She was titrated on Remeron to 15 mg at bedtime. She was discharged on 15 mg of Remeron at bedtime. Her depression was improved at that time with a GAF of 55.  Jackie Garcia required readmission to the inpatient behavioral health unit in January of this year after an  intentional overdose with Klonopin. She was discharged at that time on Remeron 45 mg at bedtime. She was seen on follow-up consultation by Behavioral Medicine and it was recommended that she be in a rehab program.   FAMILY PSYCHIATRIC HISTORY: None known.   SOCIAL HISTORY: Jackie Garcia has been living with her daughter and believes that she is a burden to her daughter. She states that her two sons will not come around her now after she has taken overdoses.   OCCUPATION: Disabled.  MARITAL STATUS: Divorced.   She does have a history of alcohol abuse. She has committed an overdose with alcohol and pills in the past.   PAST MEDICAL HISTORY: History of gastric ulcer, hypertension, chronic obstructive pulmonary disease, hypothyroidism, anemia, and osteoporosis.   PAST SURGICAL HISTORY: Cataracts, varicose veins, tubal ligation.   ALLERGIES: Codeine, Naprosyn, and sulfa.   MEDICATIONS: Her MAR is reviewed. She is on Remeron 45 mg at bedtime.   LABORATORY DATA: BUN 13, creatinine 0.48, WBC 6.2, hemoglobin 12.1, platelet count 286. TSH normal. SGOT 23, SGPT 16. EKG QTc 466 ms.   REVIEW OF SYSTEMS: Constitutional, HEENT, mouth, neurologic, psychiatric, cardiovascular, respiratory, gastrointestinal, genitourinary, skin, musculoskeletal, hematologic, lymphatic, endocrine, metabolic all unremarkable.   PHYSICAL EXAMINATION:  VITAL SIGNS: Temperature 98.3, pulse 84, respiratory rate 20, blood pressure 116/74.   GENERAL APPEARANCE: Jackie Garcia is an elderly female lying in a supine position in her hospital bed with no abnormal involuntary movements. She is not cachectic. Her muscle tone is slightly decreased. Her grooming and hygiene are normal.   Jackie Garcia is alert with good eye contact. Her concentration is decreased. She  is oriented to all spheres. Her memory is intact to immediate, recent, and remote. Her fund of knowledge, intelligence, and use of language are normal. Abstraction is  intact. Speech is soft with a slightly flat prosody. There is no dysarthria. Thought process is logical, coherent, and goal directed. No looseness of associations or tangents. Thought content: She does have hopelessness and helplessness, as well as suicidal thoughts. She is not having any delusions or hallucinations. She has no thoughts of harming others. Her insight is partial. Her judgment is impaired; however, she does understand her need for psychiatric care. Affect is constricted. Mood is depressed.   ASSESSMENT:  AXIS I:  Major depressive disorder, recurrent, severe.   AXIS II: Deferred.   AXIS III: History of ulcer. Please see the past medical history above.   AXIS IV: Primary support group, general medical.   AXIS V: 30.   [name] continues with severe depressive symptoms. She has attempted suicide within the past week with an overdose. She is at risk for suicide outside of the supportive environment of the hospital. She does continue to require a sitter at bedside for support and preventing suicidal gestures.   RECOMMENDATIONS:  1. We will continue her Remeron at 45 mg daily for now. Anticipate that we will augment her Remeron once she is cleared from a general medical perspective.  2. Admit to the inpatient behavioral unit once medically cleared for further evaluation and treatment.  3. She may benefit from a combination of Cymbalta combined with Remeron or venlafaxine combined with Remeron. These combinations include SRI and an NERI as well as the alpha-2 blockade of Remeron and the 5HT2 blockade of Remeron. This combination of effects can result in effective augmentation.  ____________________________ Adelene Amas. Roschelle Calandra, MD jsw:bjt D: 10/07/2011 17:05:29 ET T: 10/07/2011 17:26:19 ET JOB#: [id number]  cc: Adelene Amas. Alegandro Macnaughton, MD, <Dictator> [place] MD ELECTRONICALLY SIGNED 10/11/2011 20:46

## 2014-11-12 NOTE — Consult Note (Signed)
PATIENT NAME:  Jackie Garcia, Jackie Garcia MR#:  [id number] DATE OF BIRTH:  25-May-1941  DATE OF CONSULTATION:  08/11/2011  REFERRING PHYSICIAN:  Dr. Weber Cooks CONSULTING PHYSICIAN:  Tana Conch. Leslye Peer, MD  PRIMARY CARE PHYSICIAN:  Dr. Mable Fill   CHIEF COMPLAINT:  Tooth pain.  HISTORY OF PRESENT ILLNESS: This is a 74 year old female who is on the psychiatric floor for anxiety and depression. Consult was called for severe tooth pain. She states this has been going on for the past few months. She has poor dentition and needs to have some teeth pulled but cannot afford it since she does not have dental insurance. Hospitalist services were contacted for further evaluation.   PAST MEDICAL HISTORY:  1. Depression and anxiety.  2. Hypothyroidism.  3. Chronic obstructive pulmonary disease.  4. Peptic ulcer disease with bleeding episodes.  5. Compression fracture.  6. Osteoporosis. 7. Hypokalemia.   PAST SURGICAL HISTORY:  1. Bleeding ulcer surgery.  2. Cataracts bilaterally.  3. Kyphoplasty.  4. Tubal ligation.  5. Varicose veins.  6. Appendectomy.   ALLERGIES: Sulfa, Naprosyn, and codeine.   CURRENT INPATIENT MEDICATIONS:  1. Tylenol 650 mg every four hours p.r.n. pain. 2. Double strength antacid 30 mL q. 4 hours p.r.n. indigestion.  3. Miacalcin nasal spray, one spray alternating nostrils daily.  4. Calcium and vitamin D twice a day.  5. Clonazepam 0.5 mg q. 6 hours p.r.n. anxiety.  6. Ferrous sulfate 325 mg b.i.d.  7. Levothyroxine 50 mcg daily.  8. Slow-Mag 64 mg daily.  9. Nicotine oral inhaler kit, one cartridge q. one hour p.r.n.  10. Protonix 40 mg b.i.d.  11. Ambien 5 mg at bedtime.  12. Nicotine patch 14 mg daily. 13. BuSpar 5 mg t.i.d.  14. Colace 200 mg b.i.d.  15. Remeron 45 mg at bedtime.   SOCIAL HISTORY: Smokes 4 to 5 cigarettes per day. Lives at home with her daughter. No alcohol. No drug use. Used to work in [employer].   FAMILY HISTORY: Father died of metastatic lung cancer,  used to smoke. Mother died from lung disease.     REVIEW OF SYSTEMS: CONSTITUTIONAL: Positive for weight loss, 27 pounds. Positive for weakness. EYES: She does wear glasses. EARS, NOSE, MOUTH, AND THROAT: Positive for tooth pain, sinus drainage. CARDIOVASCULAR: Positive for palpitations. No chest pain.  RESPIRATORY: No shortness of breath. No coughing. No sputum. No hemoptysis. GI: Positive for nausea. Positive for constipation. No abdominal pain. No bright red blood per rectum. GU:  Positive for burning on urination. No hematuria. MUSCULOSKELETAL: Positive for back pain. PSYCH: Positive for anxiety and depression. ENDOCRINE: Positive for hypothyroidism. HEMATOLOGIC/LYMPHATIC: Positive for anemia. NEUROLOGIC: No fainting or blackouts.  INTEGUMENT: No rashes or eruptions   PHYSICAL EXAMINATION:  VITAL SIGNS: Blood pressure 117/63, pulse 90, respirations 18, temperature 98.6.   GENERAL: No respiratory distress, lying in bed.   EYES: Conjunctivae and lids normal. Pupils equal, round, and reactive to light. Extraocular muscles intact. No nystagmus.   EARS, NOSE, MOUTH, and THROAT: Nasal mucosa no erythema. Throat no erythema. No exudate seen. Poor dentition. The tooth that she is pointing to that has the most pain needs to be pulled, some swelling around it. Lips normal.   NECK: No JVD. No bruits. No lymphadenopathy. No thyromegaly. No thyroid nodules palpated.   LUNGS: Lungs are clear to auscultation. No use of accessory muscles to breathe. No rhonchi, rales, or wheeze heard.   HEART: S1 and S2 normal. No gallops, rubs, or murmurs heard. Carotid upstroke 2+  bilaterally. No bruits.  Dorsalis pedis pulses 2+ bilaterally. No edema to lower extremities.   ABDOMEN: Soft and nontender. No organomegaly/splenomegaly. Normoactive bowel sounds. No masses felt.   LYMPHATIC: No lymph nodes in the neck.   MUSCULOSKELETAL:  No clubbing, edema, or cyanosis.   SKIN: No rashes or ulcers seen.   NEUROLOGIC:  Cranial nerves II through XII grossly intact. Deep tendon reflexes 2+ bilateral lower extremities.   PSYCH: The patient is oriented to person and place.  LABORATORY, DIAGNOSTIC, AND RADIOLOGICAL DATA: Chest x-ray: Chronic obstructive pulmonary disease with fibrosis. EKG showed normal sinus rhythm, no acute ST-T wave changes. Acetaminophen level 2, glucose 82, BUN 12, creatinine 0.6, sodium 148, potassium 3.6, chloride 110, CO2 25, calcium 9.3. Liver function tests normal. Ethanol level less than 3, white blood cell count 5.7, hemoglobin and hematocrit 10.9 and 34, platelet count 893, salicylates less than 1.7, TSH 2.12. Urine toxicology negative. Troponin negative. Urinalysis negative.   ASSESSMENT AND PLAN:  1. Dental pain and tooth pain. We will give a course of clindamycin 300 mg p.o. t.i.d. for 10 days. Needs [facility] dental clinic upon discharge. Will need this tooth to be pulled. Completion of a course of antibiotics should help with pain and inflammation and buy time until able to get to the dental clinic for procedure. We will sign off at this point.  2. Hypothyroidism: Continue levothyroxine. TSH in the normal range. Continue same dose.  3. Chronic obstructive pulmonary disease. Respiratory status stable.  4. Peptic ulcer disease on Protonix twice a day.  5. Osteoporosis with compression fractures, on Miacalcin nasal spray and calcium and vitamin D.  6. Depression and anxiety treatment as per psychiatry.  7. Tobacco abuse. Nicotine patch and Nicotrol inhaler prescribed. Three minutes smoking cessation counseling done by me.    TIME SPENT ON CONSULTATION: 40 minutes.    ____________________________ Tana Conch. Leslye Peer, MD rjw:bjt D: 08/11/2011 14:10:58 ET T: 08/11/2011 14:30:09 ET JOB#: [id number]  cc: Tana Conch. Leslye Peer, MD, <Dictator> Don C. Mable Fill, MD Gonzella Lex, MD Marisue Brooklyn MD ELECTRONICALLY SIGNED 08/14/2011 15:58

## 2014-11-12 NOTE — Consult Note (Signed)
PATIENT NAME:  Jackie HolmesHENDERSON, Leshae K MR#:  [id number] DATE OF BIRTH:  [birth year]  DATE OF CONSULTATION:  08/25/2011  REFERRING PHYSICIAN:   CONSULTING PHYSICIAN:  Audery AmelJohn T. Verlon Carcione, MD  IDENTIFYING INFORMATION AND REASON FOR CONSULTATION: The patient is a 74 year old woman admitted to the hospital service because of a fall at home which has resulted in a fracture of her left femoral head. Consultation was for depression.   HISTORY OF PRESENT ILLNESS: The patient was admitted to the hospitalist service on 08/24/2011 after being found at home having acutely fallen down some stairs. She tells me that she does not even remember the incident, just remembers suddenly being on the ground and being in pain. She cannot remember if she became dizzy or if she tripped.  She was just released from the hospital on 08/20/2011 after a stay on the psychiatry ward for treatment of depression after a suicide attempt. She states that in the interval of time that she had been at home her mood had felt fairly stable. She denies that she was feeling like her depression was getting worse. She says she continued to have intermittent sleep. She claims that she had been trying to force herself to eat more, although her appetite remained poor, and she had stomach complaints. She denies that she was having any suicidal ideation whatsoever. She does tell me that she feels that the overall plan of going to stay with her granddaughter has not worked out as well as she would have liked. She tells me that she does not feel like she is welcome there. She does not want to elaborate at this time because she is in a double room and does not want to be overheard by other people in the room. She says that she has been compliant with her psychiatric and other medication.   PAST PSYCHIATRIC HISTORY: She has been treated for depression in the past. She recently had a suicide attempt by overdose of clonazepam resulting in an inpatient psychiatric  hospitalization. Medications were changed at that time and she showed improvement in her mood and depression symptoms. She continued to feel weak and tired and somewhat sickly during the hospital stay, but depressive symptoms improved. She was being treated with mirtazapine 45 mg at night.   PAST MEDICAL HISTORY: Most significant for a gastrointestinal bleed that occurred earlier in 2012. She had an acute ulcer bleed resulting in an extended hospitalization. She is still feeling like she is recovering from that.   SOCIAL HISTORY: The patient has suffered several major losses this year. Most prominently her sister with whom she was very close passed away late in 2012.  Depression has been worse since then. The patient has had difficulty functioning on her own. She just recently was taken in to stay with her granddaughter, but from what she tells me it sounds like it is not necessarily working out.   REVIEW OF SYSTEMS: She complains of pain in her hips. She feels bored, feels tired, and wishes she were not in the hospital. She denies that she is having suicidal thoughts. She says that she does not feel particularly depressed. She still is not showing much appetite and says she has to force herself to eat.   MENTAL STATUS EXAM: The patient was interviewed in her hospital bed. She was alert and oriented and cooperative. Eye contact was good. Psychomotor activity was limited in part because of her pain and recent fracture. Speech was normal in rate, tone, and volume. Affect somewhat  blunted but not tearful. Mood stated as being okay. Thoughts are lucid and directed. No evidence of thought disorder, delusions, or hallucinations. She denies any suicidal or homicidal ideation. She shows adequate judgment and insight.   ASSESSMENT: This is a 74 year old woman with a history of recurrent major depression who recently had a psychiatric hospitalization, was still recovering from her depression and now admitted  after  a fall with fracture to her hip. On my interview with her today, she appears to be in about the same mental state she was when she was discharged from psychiatry a few days ago. She reports that she does not feel like her depression is worse and she is denying any suicidal ideation. She is clearly in significant pain and circumstances have changed since having this fall. There does sound like there is some difficulty at home. At this point, I do not think there is a clear indication to change her medical treatment for her depression. I have discussed ECT with her in the past when she first came in to psychiatry, but because of her improvement in symptoms it did not seem like a necessary treatment at that time. I do not think that we need to consider that at this juncture either.   TREATMENT PLAN: Continue her on the current dose of mirtazapine 45 mg at night. I spent some time doing supportive therapy and counseling with her today. I have discussed with her the possibility that going to rehab and perhaps considering assisted living would be better for her in the long run and she is open to this discussion.   DIAGNOSIS PRINCIPLE AND PRIMARY:   AXIS I: Major depressive episode, severe, recurrent, in partial remission.   SECONDARY DIAGNOSES:   AXIS I: No other.   AXIS II: Deferred.   AXIS III: Fractured hip, underweight, still recovering from a GI bleed, hypothyroid.   AXIS IV: Severe stress from social dislocation and now acute pain and immobility.   AXIS V: Functioning at time of evaluation 39.  ____________________________ Audery Amel, MD jtc:slb D: 08/25/2011 21:36:29 ET T: 08/26/2011 09:46:22 ET JOB#: [id number]  cc: Audery Amel, MD, <Dictator> Audery Amel MD ELECTRONICALLY SIGNED 08/27/2011 10:43

## 2014-11-12 NOTE — Discharge Summary (Signed)
PATIENT NAME:  Jackie Garcia, Jackie Garcia MR#:  [id number] DATE OF BIRTH:  [birth year]  DATE OF ADMISSION:  08/24/2011 DATE OF DISCHARGE:  08/29/2011  ADDENDUM  The patient did not get discharged yesterday to rehab due to insurance clearance. She did fairly well with her physical therapy.   Laboratory studies this morning complete show that her sugar was 93, BUN 8, creatinine 0.5, sodium 143, potassium 4.1. EGFR was greater than 60. Her hemoglobin is up to 10.4.   During the day, she was able to ambulate. We will get the Foley catheter out successfully. She did have a small bowel movement with the use of glycerin suppositories.   Vital signs remained stable at 119/73 lying, 110/71 sitting and 124/77 standing. She had active bowel sounds on the exam. The exam was otherwise unremarkable. There has been no change in her medication and orders were no change from the dictation yesterday.   ____________________________ Dianah Field. Mable Fill, MD dcc:ap D: 08/29/2011 07:39:16 ET T: 08/29/2011 08:35:49 ET JOB#: [id number]  cc: Vianney Kopecky C. Mable Fill, MD, <Dictator> Tawni Millers MD ELECTRONICALLY SIGNED 08/29/2011 18:21

## 2014-11-12 NOTE — Consult Note (Signed)
Brief Consult Note: Diagnosis: major depression.   Patient was seen by consultant.   Consult note dictated.   Recommend further assessment or treatment.   Comments: Psychihatry: Patient seen. Known to me from recent inpatient hospital stay. Mood continues to be about what it was when recently discharged. Denies SI and denies feeling depressed. Lucid and alert. Toleratiing medication well. I spent some time doing some supportive therapy. Will follow up. I brought up the subject of possibly doing beter to go to rehab than back home.  Electronic Signatures: Audery Amellapacs, Dotti Busey T (MD)  (Signed 04-Feb-13 13:33)  Authored: Brief Consult Note   Last Updated: 04-Feb-13 13:33 by Audery Amellapacs, Chela Sutphen T (MD)

## 2014-11-12 NOTE — Discharge Summary (Signed)
PATIENT NAME:  [name], Sheyenne K MR#:  [id number] DATE OF BIRTH:  1941-07-04  DATE OF ADMISSION:  07/18/2011 DATE OF DISCHARGE:  07/25/2011  HISTORY: [name] is a 74 year old female with osteoporosis, depression, hypothyroid, who was admitted to the hospital on 12/20 and suffered a catastrophic peptic ulcer hemorrhage with CODE BLUE. She responded quickly to IV fluids and was given transfusions and seen by the gastroenterologist, Dr. Niel HummerIftikhar. The endoscopy showed a significant bleeding peptic ulcer that was cauterized and sealed. She did well from that with stable vital signs, etc. She was seen by the psychiatrist to help with her medications. There had been some question of medication changes by her outpatient psychiatrist and program was instigated. She became stable. She refused to go to rehab. She was sent home with home health. She was home for just a few hours when she had a weak or presyncopal or fainting-like spell and was brought back to the Emergency Room because of some chest discomfort. She was admitted for observation for possible cardiac symptoms. She was cardiovascularly stable. Her enzymes were negative. Telemetry was negative and she was sent home to continue her convalescence from her peptic ulcer and she again continued to have her weakness, became weak, either fainted or passed out and was brought back to the Emergency Room where her exam was benign.   She had been known to have some orthostatic hypotension during her hospitalization and had been sent home with TED hose. Home health had been arranged, including home physical therapy after her first admission and was restarted after her second observation admission. She failed home management and was brought back to the Emergency Room and readmitted this time for her weakness. Her vital signs remained stable. Her cardiac status was stable. She underwent physical therapy. Attempt to place her in a rehab facility was unsuccessful due  to her insurance coverage and her progress as an inpatient with physical therapy.   She stayed a week in our hospital for continued convalescence.   LABORATORY STUDIES: Last checked on 01/02: Sugar was 88, BUN 13, creatinine 0.07, sodium 141, potassium 3.6, chloride 106. Hemoglobin was stable at 10. White count 6,100. Urinalysis was unremarkable.   VITAL SIGNS: Blood pressure from 127/62 to 91/65. Oximetry was 96%.   HOSPITAL COURSE: Extensive physical therapy evaluation on 01/03 with functional outcome. Please see chart, but appeared to be substantially stable with her ambulating 360 in four seconds without difficulty, completed eight steps safely in 20 seconds, was able to lift her legs equally. She was able to reach greater than 5 inches safely and was able to ambulate in the hall.   The patient is being discharged with generalized weakness, orthostatic hypotension, depression, failure to thrive, previous significant peptic ulcer with gastrointestinal hemorrhage, anemia, degenerative arthritis and a history compatible with spinal stenosis syndrome and hypothyroidism.   DISCHARGE MEDICATIONS:  New medication added at this time is Klonopin 0.5 mg one-half to a whole tablet every 12 hours p.r.n. for extreme anxiety. She has averaged approximately one per 24 hours during the past four days.   Preadmission medications will be unchanged which will include: 1. Tylenol 650 as needed.  2. Calcitonin nasal spray 1 puff per nostril for osteoporosis.  3. Calcium and vitamin D one tablet twice a day with meals.  4. Synthroid 50 mcg daily.  5. Remeron 15 mg daily.  6. Multivitamin one daily. 7. Protonix 40 mg b.i.d. for ulcer. 8. Mylanta chewable as needed for gas.  9. Ambien  5 mg at bedtime.  10. Milk of magnesia as needed for constipation.  11. Ferrous sulfate 325 mg tablets b.i.d. for anemia. 12. Slow-Mag 1 tablet 64 mg daily.   FOLLOW-UP: 1. She is to keep her scheduled appointment with me  next week for follow-up.  2. Home health pick up her with physical therapy.  3. She was scheduled for her psychiatrist follow-up on her last admission and she is to keep that appointment. That date is not available to me at this dictation.       OVERALL PROGNOSIS: Remains guarded.    ____________________________ Jimmie Molly Candelaria Stagers, MD dcc:ap D: 07/25/2011 09:20:26 ET T: 07/25/2011 09:44:17 ET JOB#: [id number]  cc: Jimmie Molly. Candelaria Stagers, MD, <Dictator> Virl Axe MD ELECTRONICALLY SIGNED 07/25/2011 13:03

## 2014-11-28 NOTE — H&P (Signed)
DATE OF CONSULTATION:  10/09/2011 COMPLAINT AND IDENTIFYING DATA: 74 year old female admitted for severe depression  and suicidal ideation, status post overdose  7days ago. OF PRESENT ILLNESS: The overdose was 3 days before presenting to the emergency room earlier this week. She vomited those tablets up. Please see general medical record. She spent time on a general medical ward and underwent a cardiac workup for chest pain and shortness of breath.  She has continued with over two weeks of depressed mood, poor energy, poor concentration, anhedonia, thoughts of hopelessness and helplessness, as well as suicidal ideation. She has no hallucinations or delusions.  orientation and memory function are intact. She cites the death of her sister in November as an ongoing stress and grief.  She also states that she has been thinking that she is a burden to her daughter.  PSYCHIATRIC HISTORY: [name] has suffered from several episodes of major depression. She first began experiencing major depression in the 1980s. She has undergone several psychiatric hospitalizations. She has made multiple suicide attempts. An overdose in the past with medication and alcohol precipitated an admission to [facility].  has undergone a number of medication trials including Cymbalta augmented with trazodone,  Celexa combined with Wellbutrin, Remeron, Lexapro, Zoloft, and venlafaxine.  December 2012 she was seen on the general medical floor by the undersigned and was showing improvement in her depression at that time. She was titrated on Remeron to 15 mg at bedtime. She was discharged on 15 mg of Remeron at bedtime. Her depression was improved at that time with a GAF of 55. Jackie Garcia required readmission to the inpatient behavioral health unit in January of this year after an intentional overdose with Klonopin. She was discharged at that time on Remeron 45 mg at bedtime. She was seen on follow-up consultation by Behavioral  Medicine and it was recommended that she be in a rehab program.  PSYCHIATRIC HISTORY: None known.  HISTORY: Mrs. Jackie Garcia has been living with her daughter and believes that she is a burden to her daughter. She states that her two sons will not come around her now after she has taken overdoses.  Disabled. STATUS: Divorced.  does have a history of alcohol abuse. She has committed an overdose with alcohol and pills in the past.  MEDICAL HISTORY: History of gastric ulcer, hypertension, chronic obstructive pulmonary disease, hypothyroidism, anemia, and osteoporosis.  SURGICAL HISTORY: Cataracts, varicose veins, tubal ligation.   ALLERGIES: Codeine, Naprosyn, and sulfa.  Her MAR is reviewed. She is on Remeron 45 mg at bedtime.  DATA: BUN 13, creatinine 0.48, WBC 6.2, hemoglobin 12.1, platelet count 286. TSH normal. SGOT 23, SGPT 16. EKG QTc 466 ms.  OF SYSTEMS: Constitutional, HEENT, mouth, neurologic, psychiatric, cardiovascular, respiratory, gastrointestinal, genitourinary, skin, musculoskeletal, hematologic, lymphatic, endocrine, metabolic all unremarkable.  EXAMINATION: SIGNS: Temperature 98.3, pulse 84, respiratory rate 20, blood pressure 116/74.  APPEARANCE: Mrs. Jackie Garcia is an elderly female lying in a supine position in her hospital bed with no abnormal involuntary movements. She is not cachectic. Her muscle tone is slightly decreased. Her grooming and hygiene are normal.  Jackie Garcia is alert with good eye contact. Her concentration is decreased. She is oriented to all spheres. Her memory is intact to immediate, recent, and remote. Her fund of knowledge, intelligence, and use of language are normal. Abstraction is intact. Speech is soft with a slightly flat prosody. There is no dysarthria. Thought process is logical, coherent, and goal directed. No looseness of associations or tangents. Thought content: She does  have hopelessness and helplessness, as well as suicidal thoughts. She is not having  any delusions or hallucinations. She has no thoughts of harming others. Her insight is partial. Her judgment is impaired; however, she does understand her need for psychiatric care. Affect is constricted. Mood is depressed.  I:  Major depressive disorder, recurrent, severe.  II: Deferred.  III: History of ulcer. Please see the past medical history above.  IV: Primary support group, general medical.  V: 30.  Jackie Garcia continues with severe depressive symptoms. She has attempted suicide within the past week with an overdose. She is at risk for suicide outside of the supportive environment of the hospital.  We will continue her Remeron at 45 mg daily for now. Anticipate that we will augment her Remeron.   She may benefit from a combination of Cymbalta combined with Remeron or venlafaxine combined with Remeron. These combinations include SRI and an NERI as well as the alpha-2 blockade of Remeron and the 5HT2 blockade of Remeron. This chemical synergism can result in effective augmentation.  will attend group psychotherapy.  Electronic Signatures: Lester CarolinaWilliford, Isaly Fasching S (MD)  (Signed on 21-Mar-13 11:58)  Authored  Last Updated: 21-Mar-13 11:58 by Lester CarolinaWilliford, Kaylie Ritter S (MD)

## 2020-10-07 ENCOUNTER — Other Ambulatory Visit: Payer: Self-pay

## 2020-10-07 ENCOUNTER — Observation Stay
Admission: EM | Admit: 2020-10-07 | Discharge: 2020-10-09 | Disposition: A | Discharge status: Home / Self Care | Payer: Medicare HMO | Attending: Internal Medicine | Admitting: Internal Medicine

## 2020-10-07 ENCOUNTER — Emergency Department: Payer: Medicare HMO

## 2020-10-07 DIAGNOSIS — E039 Hypothyroidism, unspecified: Secondary | ICD-10-CM | POA: Diagnosis present

## 2020-10-07 DIAGNOSIS — N3000 Acute cystitis without hematuria: Secondary | ICD-10-CM

## 2020-10-07 DIAGNOSIS — Z20822 Contact with and (suspected) exposure to covid-19: Secondary | ICD-10-CM | POA: Diagnosis not present

## 2020-10-07 DIAGNOSIS — J441 Chronic obstructive pulmonary disease with (acute) exacerbation: Principal | ICD-10-CM | POA: Insufficient documentation

## 2020-10-07 DIAGNOSIS — F17210 Nicotine dependence, cigarettes, uncomplicated: Secondary | ICD-10-CM | POA: Insufficient documentation

## 2020-10-07 DIAGNOSIS — J449 Chronic obstructive pulmonary disease, unspecified: Secondary | ICD-10-CM | POA: Diagnosis present

## 2020-10-07 DIAGNOSIS — Z79899 Other long term (current) drug therapy: Secondary | ICD-10-CM | POA: Diagnosis not present

## 2020-10-07 DIAGNOSIS — R0602 Shortness of breath: Secondary | ICD-10-CM | POA: Diagnosis present

## 2020-10-07 DIAGNOSIS — Z720 Tobacco use: Secondary | ICD-10-CM | POA: Diagnosis present

## 2020-10-07 DIAGNOSIS — F17200 Nicotine dependence, unspecified, uncomplicated: Secondary | ICD-10-CM | POA: Insufficient documentation

## 2020-10-07 HISTORY — DX: Emphysema, unspecified: J43.9

## 2020-10-07 LAB — URINALYSIS, COMPLETE (UACMP) WITH MICROSCOPIC
Bilirubin Urine: NEGATIVE
Glucose, UA: NEGATIVE mg/dL
Ketones, ur: NEGATIVE mg/dL
Leukocytes,Ua: NEGATIVE
Nitrite: NEGATIVE
Protein, ur: NEGATIVE mg/dL
Specific Gravity, Urine: 1.026 (ref 1.005–1.030)
pH: 5 (ref 5.0–8.0)

## 2020-10-07 LAB — HEPATIC FUNCTION PANEL
ALT: 23 U/L (ref 0–44)
AST: 27 U/L (ref 15–41)
Albumin: 3.1 g/dL — ABNORMAL LOW (ref 3.5–5.0)
Alkaline Phosphatase: 70 U/L (ref 38–126)
Bilirubin, Direct: 0.1 mg/dL (ref 0.0–0.2)
Indirect Bilirubin: 0.5 mg/dL (ref 0.3–0.9)
Total Bilirubin: 0.6 mg/dL (ref 0.3–1.2)
Total Protein: 7 g/dL (ref 6.5–8.1)

## 2020-10-07 LAB — CBC
HCT: 37.8 % (ref 36.0–46.0)
Hemoglobin: 12.5 g/dL (ref 12.0–15.0)
MCH: 30 pg (ref 26.0–34.0)
MCHC: 33.1 g/dL (ref 30.0–36.0)
MCV: 90.9 fL (ref 80.0–100.0)
Platelets: 324 10*3/uL (ref 150–400)
RBC: 4.16 MIL/uL (ref 3.87–5.11)
RDW: 14.2 % (ref 11.5–15.5)
WBC: 10.2 10*3/uL (ref 4.0–10.5)
nRBC: 0 % (ref 0.0–0.2)

## 2020-10-07 LAB — CK: Total CK: 59 U/L (ref 38–234)

## 2020-10-07 LAB — RESP PANEL BY RT-PCR (FLU A&B, COVID) ARPGX2
Influenza A by PCR: NEGATIVE
Influenza B by PCR: NEGATIVE
SARS Coronavirus 2 by RT PCR: NEGATIVE

## 2020-10-07 LAB — BASIC METABOLIC PANEL
Anion gap: 10 (ref 5–15)
BUN: 22 mg/dL (ref 8–23)
CO2: 23 mmol/L (ref 22–32)
Calcium: 9.4 mg/dL (ref 8.9–10.3)
Chloride: 105 mmol/L (ref 98–111)
Creatinine, Ser: 0.55 mg/dL (ref 0.44–1.00)
GFR, Estimated: >60 mL/min (ref >60)
Glucose, Bld: 101 mg/dL — ABNORMAL HIGH (ref 70–99)
Potassium: 4.1 mmol/L (ref 3.5–5.1)
Sodium: 138 mmol/L (ref 135–145)

## 2020-10-07 LAB — PROCALCITONIN: Procalcitonin: <0.1 ng/mL

## 2020-10-07 LAB — TROPONIN I (HIGH SENSITIVITY)
Troponin I (High Sensitivity): 12 ng/L (ref <18)
Troponin I (High Sensitivity): 12 ng/L (ref <18)

## 2020-10-07 MED ORDER — METHYLPREDNISOLONE SODIUM SUCC 125 MG IJ SOLR
125.0000 mg | Freq: Once | INTRAMUSCULAR | Status: AC
  Administered 2020-10-07: 125 mg via INTRAVENOUS
  Filled 2020-10-07: qty 2

## 2020-10-07 MED ORDER — SODIUM CHLORIDE 0.9 % IV SOLN
500.0000 mg | Freq: Once | INTRAVENOUS | Status: AC
  Administered 2020-10-07: 500 mg via INTRAVENOUS
  Filled 2020-10-07: qty 500

## 2020-10-07 MED ORDER — SODIUM CHLORIDE 0.9 % IV SOLN
1.0000 g | Freq: Once | INTRAVENOUS | Status: AC
  Administered 2020-10-07: 1 g via INTRAVENOUS
  Filled 2020-10-07: qty 10

## 2020-10-07 MED ORDER — TRAZODONE HCL 100 MG PO TABS
100.0000 mg | ORAL_TABLET | Freq: Every day | ORAL | Status: DC
  Administered 2020-10-08: 100 mg via ORAL
  Filled 2020-10-07: qty 1

## 2020-10-07 MED ORDER — IPRATROPIUM-ALBUTEROL 0.5-2.5 (3) MG/3ML IN SOLN
9.0000 mL | Freq: Once | RESPIRATORY_TRACT | Status: AC
  Administered 2020-10-07: 9 mL via RESPIRATORY_TRACT
  Filled 2020-10-07: qty 9

## 2020-10-07 NOTE — ED Triage Notes (Signed)
FIRST NURSE: EMS reports [facility] for last 4 days.  VSS for EMS.  NAD noted at present

## 2020-10-07 NOTE — ED Provider Notes (Signed)
[facility] Emergency Department Provider Note  ____________________________________________   Event Date/Time   First MD Initiated Contact with Patient 10/07/20 2107     (approximate)  I have reviewed the triage vital signs and the nursing notes.   HISTORY  Chief Complaint Shortness of Breath   HPI Jackie Garcia is a 80 y.o. female with a past medical history of COPD and ongoing tobacco abuse who presents accompanied by her daughter for assessment of 3 to 4 days of worsening shortness of breath associate with nonproductive cough mild headache and some myalgias.  Patient also notes she has had some burning with urination over the last week and a half or so.  She denies any measured fevers but has had some subjective fevers.  No earache, vision changes, vertigo, dizziness, chest pain, Donnell pain, back pain, rash, diarrhea, vomiting or extremity pain.  No recent falls or injuries.  She notes he recently moved to the area from [place] and does not currently have any inhalers as her new insurance told her daughter they do not cover it.  She has never been diagnosed with high blood pressure.  No other acute concerns at this time.          Past Medical History:  Diagnosis Date  . Emphysema lung [facility])     Patient Active Problem List   Diagnosis Date Noted  . Hypothyroidism 10/08/2020  . COPD exacerbation (HCC) 10/07/2020  . Tobacco abuse 10/07/2020    Past Surgical History:  Procedure Laterality Date  . TUBAL LIGATION      Prior to Admission medications   Not on File    Allergies Codeine and Sulfa antibiotics  Family History  Problem Relation Age of Onset  . Lung cancer Father   . Diabetes Mellitus II Sister     Social History Social History   Tobacco Use  . Smoking status: Current Every Day Smoker    Packs/day: 0.50  . Smokeless tobacco: Never Used  Substance Use Topics  . Alcohol use: Never    Review of Systems  Review of  Systems  Constitutional: Positive for fever and malaise/fatigue. Negative for chills.  HENT: Negative for sore throat.   Eyes: Negative for pain.  Respiratory: Positive for cough, shortness of breath and wheezing. Negative for stridor.   Cardiovascular: Negative for chest pain.  Gastrointestinal: Negative for abdominal pain, diarrhea and vomiting.  Genitourinary: Positive for dysuria.  Musculoskeletal: Positive for myalgias.  Skin: Negative for rash.  Neurological: Positive for headaches. Negative for seizures and loss of consciousness.  Psychiatric/Behavioral: Negative for suicidal ideas.  All other systems reviewed and are negative.     ____________________________________________   PHYSICAL EXAM:  VITAL SIGNS: ED Triage Vitals  Enc Vitals Group     BP 10/07/20 1803 (!) 165/89     Pulse Rate 10/07/20 1803 74     Resp 10/07/20 1803 16     Temp 10/07/20 1803 (!) 97.4 F (36.3 C)     Temp Source 10/07/20 1803 Oral     SpO2 10/07/20 1803 95 %     Weight 10/07/20 1804 170 lb (77.1 kg)     Height 10/07/20 1804 5\' 4"  (1.626 m)     Head Circumference --      Peak Flow --      Pain Score 10/07/20 1803 0     Pain Loc --      Pain Edu? --      Excl. in GC? --  Vitals:   10/07/20 2200 10/07/20 2300  BP: (!) 173/76 (!) 168/86  Pulse: 78 (!) 105  Resp: (!) 26 (!) 21  Temp:    SpO2: 100% 94%   Physical Exam Vitals and nursing note reviewed.  Constitutional:      General: She is not in acute distress.    Appearance: She is well-developed.  HENT:     Head: Normocephalic and atraumatic.     Right Ear: External ear normal.     Left Ear: External ear normal.     Nose: Nose normal.  Eyes:     Conjunctiva/sclera: Conjunctivae normal.  Cardiovascular:     Rate and Rhythm: Normal rate and regular rhythm.     Heart sounds: No murmur heard.   Pulmonary:     Effort: Pulmonary effort is normal. Tachypnea present. No respiratory distress.     Breath sounds: Examination of  the right-upper field reveals decreased breath sounds and wheezing. Examination of the left-upper field reveals decreased breath sounds and wheezing. Examination of the right-middle field reveals decreased breath sounds and wheezing. Examination of the left-middle field reveals decreased breath sounds and wheezing. Examination of the right-lower field reveals decreased breath sounds and wheezing. Examination of the left-lower field reveals decreased breath sounds and wheezing. Decreased breath sounds and wheezing present.  Abdominal:     Palpations: Abdomen is soft.     Tenderness: There is no abdominal tenderness.  Musculoskeletal:     Cervical back: Neck supple.  Skin:    General: Skin is warm and dry.  Neurological:     Mental Status: She is alert and oriented to person, place, and time.  Psychiatric:        Mood and Affect: Mood normal.      ____________________________________________   LABS (all labs ordered are listed, but only abnormal results are displayed)  Labs Reviewed  BASIC METABOLIC PANEL - Abnormal; Notable for the following components:      Result Value   Glucose, Bld 101 (*)    All other components within normal limits  URINALYSIS, COMPLETE (UACMP) WITH MICROSCOPIC - Abnormal; Notable for the following components:   Color, Urine YELLOW (*)    APPearance CLEAR (*)    Hgb urine dipstick SMALL (*)    Bacteria, UA RARE (*)    All other components within normal limits  HEPATIC FUNCTION PANEL - Abnormal; Notable for the following components:   Albumin 3.1 (*)    All other components within normal limits  RESP PANEL BY RT-PCR (FLU A&B, COVID) [id number]  URINE CULTURE  CBC  PROCALCITONIN  CK  BLOOD GAS, VENOUS  TROPONIN I (HIGH SENSITIVITY)  TROPONIN I (HIGH SENSITIVITY)   ____________________________________________  EKG  Sinus rhythm with ventricular rate of 75, normal axis, unremarkable intervals and some nonspecific ST changes in lateral leads V5 and V6  without other evidence of acute ischemia.  ____________________________________________  RADIOLOGY  ED MD interpretation: Opacification at the right middle lobe and right base concerning for early pneumonia.  No large effusion, overt edema, pneumothorax or any other clear acute intrathoracic process.   Official radiology report(s): DG Chest 2 View  Result Date: 10/07/2020 CLINICAL DATA:  Shortness of breath EXAM: CHEST - 2 VIEW COMPARISON:  08/12/2012 FINDINGS: There is hyperinflation of the lungs compatible with COPD. Heart is normal size. Consolidation in the right middle lobe at the right lung base concerning for pneumonia. No confluent opacity on the left. No effusions or acute bony abnormality. IMPRESSION: COPD. Right  basilar/middle lobe opacity concerning for pneumonia. Electronically Signed   By: Charlett Nose M.D.   On: 10/07/2020 18:47    ____________________________________________   PROCEDURES  Procedure(s) performed (including Critical Care):  .1-3 Lead EKG Interpretation Performed by: Gilles Chiquito, MD Authorized by: Gilles Chiquito, MD     Interpretation: normal     ECG rate assessment: tachycardic     Rhythm: sinus tachycardia     Ectopy: none     Conduction: normal       ____________________________________________   INITIAL IMPRESSION / ASSESSMENT AND PLAN / ED COURSE      Patient presents with above-stated history exam for assessment of 3 to 4 days of worsening cough shortness of breath and wheezing associate with about a week and a half of some burning with urination.  Fortunately she is still smoking.  She does not currently have any inhalers at home.  On arrival she is tachypneic but otherwise with stable vital signs on room air.  On exam she has diffuse wheezing throughout all lung fields and some tachypnea.  Abdomen is soft nontender throughout and she has no CVA tenderness.   Suspect COPD exacerbation possibly triggered by underlying pneumonia as  chest x-ray shows evidence of some right lower lobe opacity.  No evidence of heart failure on exam or imaging.  No evidence of pneumothorax or other clear acute thoracic process on chest x-ray.  EKG has some nonspecific findings given nonelevated troponin obtained greater than 3 hours after symptom onset of low suspicion for ACS or significant arrhythmia at this time.  BMP remarkable for no significant electrolyte or metabolic derangements.  CBC shows no leukocytosis or acute anemia that would explain patient's symptoms.  Given her history of brain with urination UA was sent that shows some rare bacteria but otherwise pretty unremarkable.  Low suspicion for pyelonephritis given absence of fever elevated white blood cell count or CVA tenderness.  Urine culture sent.  Rocephin given for COPD exacerbation will cover for possible UTI.  Patient treated with duo nebs Solu-Medrol as a throw and Rocephin for COPD exacerbation.  However on reassessment she was still wheezing in all lung fields and was tachypneic and little tachycardic.  I will plan to admit to medicine service for further evaluation management.      ____________________________________________   FINAL CLINICAL IMPRESSION(S) / ED DIAGNOSES  Final diagnoses:  COPD exacerbation (HCC)  Acute cystitis without hematuria    Medications  traZODone (DESYREL) tablet 100 mg (has no administration in time range)  ipratropium-albuterol (DUONEB) 0.5-2.5 (3) MG/3ML nebulizer solution 9 mL (9 mLs Nebulization Given 10/07/20 2127)  methylPREDNISolone sodium succinate (SOLU-MEDROL) 125 mg/2 mL injection 125 mg (125 mg Intravenous Given 10/07/20 2127)  cefTRIAXone (ROCEPHIN) 1 g in sodium chloride 0.9 % 100 mL IVPB (0 g Intravenous Stopped 10/07/20 2158)  azithromycin (ZITHROMAX) 500 mg in sodium chloride 0.9 % 250 mL IVPB (500 mg Intravenous New Bag/Given 10/07/20 2153)     ED Discharge Orders    None       Note:  This document was prepared using  Dragon voice recognition software and may include unintentional dictation errors.   Gilles Chiquito, MD 10/08/20 [phone]

## 2020-10-07 NOTE — ED Triage Notes (Signed)
Pt states she has had trouble breathing for the last 4 days- pt states that normally around this time of year she gets bronchitis or a sinus infection- pt denies any chest pain

## 2020-10-07 NOTE — H&P (Signed)
Jackie Garcia NWG:[id number] DOB: March 25, 1941 DOA: 10/07/2020    PCP: Oneita Hurt, No   Outpatient Specialists:  NONE   Patient arrived to ER on 10/07/20 at 1754 Referred by Attending Gilles Chiquito, MD   Patient coming from: home LivesWith family   Chief Complaint:   Chief Complaint  Patient presents with  . Shortness of Breath    HPI: Jackie Garcia is a 80 y.o. female with medical history significant of  COPD, sinusitis, tobacco abuse    Presented with 4-day history of shortness of breath no chest pain. She continues to smoke Recently moved here from [place] 4 months ago, run out of inhales States her BP runs low Mostly takes vitamins Not sure what they are They just found her Nebulizer Still smokes 3 cigarette a day No chest pain no fever Infectious risk factors:  Reports shortness of breath, dry cough,       Has   been vaccinated against COVID x1   Initial COVID TEST  NEGATIVE   Lab Results  Component Value Date   SARSCOV2NAA NEGATIVE 10/07/2020     Regarding pertinent Chronic problems:        Hypothyroidism:  Lab Results  Component Value Date   TSH 6.31 (H) 08/12/2012   on synthroid     COPD - not followed by pulmonology      While in ER: Was treated with Duoneb, Solumedrol  Rocephin/Azithro CXR showing COPD Continues to be wheezing and tachypneic   ED Triage Vitals  Enc Vitals Group     BP 10/07/20 1803 (!) 165/89     Pulse Rate 10/07/20 1803 74     Resp 10/07/20 1803 16     Temp 10/07/20 1803 (!) 97.4 F (36.3 C)     Temp Source 10/07/20 1803 Oral     SpO2 10/07/20 1803 95 %     Weight 10/07/20 1804 170 lb (77.1 kg)     Height 10/07/20 1804  (1.626 m)     Head Circumference --      Peak Flow --      Pain Score 10/07/20 1803 0     Pain Loc --      Pain Edu? --      Excl. in GC? --   TMAX(24)@     _________________________________________ Significant initial  Findings: Abnormal Labs Reviewed  BASIC METABOLIC PANEL - Abnormal;  Notable for the following components:      Result Value   Glucose, Bld 101 (*)    All other components within normal limits  URINALYSIS, COMPLETE (UACMP) WITH MICROSCOPIC - Abnormal; Notable for the following components:   Color, Urine YELLOW (*)    APPearance CLEAR (*)    Hgb urine dipstick SMALL (*)    Bacteria, UA RARE (*)    All other components within normal limits   ____________________________________________ Ordered   CXR - COPD   _________________________ Troponin  12 ECG: Ordered Personally reviewed by me showing: HR : 75 Rhythm:  NSR,     no evidence of ischemic changes QTC 431 _______     The recent clinical data is shown below. Vitals:   10/07/20 1932 10/07/20 2055 10/07/20 2100 10/07/20 2200  BP: (!) 159/80 (!) 171/85 (!) 169/91 (!) 173/76  Pulse: 75 73 74 78  Resp: 18 (!) 22 (!) 24 (!) 26  Temp:      TempSrc:      SpO2: 97% 98% 99% 100%  Weight:  Height:         WBC     Component Value Date/Time   WBC 10.2 10/07/2020 1811   LYMPHSABS 2.0 02/21/2012 0829   MONOABS 0.6 02/21/2012 0829   EOSABS 0.3 02/21/2012 0829   BASOSABS 0.1 02/21/2012 0829    Procalcitonin  <0.1   UA not ordered   Urine analysis:    Component Value Date/Time   COLORURINE YELLOW (A) 10/07/2020 2145   APPEARANCEUR CLEAR (A) 10/07/2020 2145   APPEARANCEUR Hazy 08/12/2012 1348   LABSPEC 1.026 10/07/2020 2145   LABSPEC 1.018 08/12/2012 1348   PHURINE 5.0 10/07/2020 2145   GLUCOSEU NEGATIVE 10/07/2020 2145   GLUCOSEU Negative 08/12/2012 1348   HGBUR SMALL (A) 10/07/2020 2145   BILIRUBINUR NEGATIVE 10/07/2020 2145   BILIRUBINUR Negative 08/12/2012 1348   KETONESUR NEGATIVE 10/07/2020 2145   PROTEINUR NEGATIVE 10/07/2020 2145   NITRITE NEGATIVE 10/07/2020 2145   LEUKOCYTESUR NEGATIVE 10/07/2020 2145   LEUKOCYTESUR Negative 08/12/2012 1348    Results for orders placed or performed during the hospital encounter of 10/07/20  Resp Panel by RT-PCR (Flu A&B, Covid)  Nasopharyngeal Swab     Status: None   Collection Time: 10/07/20  9:28 PM   Specimen: Nasopharyngeal Swab; Nasopharyngeal(NP) swabs in vial transport medium  Result Value Ref Range Status   SARS Coronavirus 2 by RT PCR NEGATIVE NEGATIVE Final         Influenza A by PCR NEGATIVE NEGATIVE Final   Influenza B by PCR NEGATIVE NEGATIVE Final          ____ _______________________________________________ Hospitalist was called for admission for COPD exacerbation  The following Work up has been ordered so far:  Orders Placed This Encounter  Procedures  . Resp Panel by RT-PCR (Flu A&B, Covid) Nasopharyngeal Swab  . Urine Culture  . DG Chest 2 View  . Basic metabolic panel  . CBC  . Procalcitonin - Baseline  . Urinalysis, Complete w Microscopic  . Document Height and Actual Weight  . Check Pulse Oximetry while ambulating  . Consult to hospitalist  . Airborne and Contact precautions  . ED EKG    Following Medications were ordered in ER: Medications  ipratropium-albuterol (DUONEB) 0.5-2.5 (3) MG/3ML nebulizer solution 9 mL (9 mLs Nebulization Given 10/07/20 2127)  methylPREDNISolone sodium succinate (SOLU-MEDROL) 125 mg/2 mL injection 125 mg (125 mg Intravenous Given 10/07/20 2127)  cefTRIAXone (ROCEPHIN) 1 g in sodium chloride 0.9 % 100 mL IVPB (0 g Intravenous Stopped 10/07/20 2158)  azithromycin (ZITHROMAX) 500 mg in sodium chloride 0.9 % 250 mL IVPB (500 mg Intravenous New Bag/Given 10/07/20 2153)        Consult Orders  (From admission, onward)         Start     Ordered   10/07/20 2244  Consult to hospitalist  Once       Provider:  (Not yet assigned)  Question Answer Comment  Place call to: [phone]   Reason for Consult Admit   Diagnosis/Clinical Info for Consult: COPD exacerbation      10/07/20 2244          OTHER Significant initial  Findings:  labs showing:    Recent Labs  Lab 10/07/20 1811  NA 138  K 4.1  CO2 23  GLUCOSE 101*  BUN 22  CREATININE 0.55   CALCIUM 9.4    Cr  stable,   Lab Results  Component Value Date   CREATININE 0.55 10/07/2020   CREATININE 0.68 08/17/2012   CREATININE 0.62  08/12/2012    Recent Labs  Lab 10/07/20 1811  AST 27  ALT 23  ALKPHOS 70  BILITOT 0.6  PROT 7.0  ALBUMIN 3.1*   Lab Results  Component Value Date   CALCIUM 9.4 10/07/2020      Plt: Lab Results  Component Value Date   PLT 324 10/07/2020    Recent Labs  Lab 10/07/20 1811  WBC 10.2  HGB 12.5  HCT 37.8  MCV 90.9  PLT 324    HG/HCT  stable,       Component Value Date/Time   HGB 12.5 10/07/2020 1811   HGB 15.3 08/12/2012 1354   HCT 37.8 10/07/2020 1811   HCT 45.7 08/12/2012 1354   MCV 90.9 10/07/2020 1811   MCV 87 08/12/2012 1354       Radiological Exams on Admission: DG Chest 2 View  Result Date: 10/07/2020 CLINICAL DATA:  Shortness of breath EXAM: CHEST - 2 VIEW COMPARISON:  08/12/2012 FINDINGS: There is hyperinflation of the lungs compatible with COPD. Heart is normal size. Consolidation in the right middle lobe at the right lung base concerning for pneumonia. No confluent opacity on the left. No effusions or acute bony abnormality. IMPRESSION: COPD. Right basilar/middle lobe opacity concerning for pneumonia. Electronically Signed   By: Charlett NoseKevin  Dover M.D.   On: 10/07/2020 18:47   _______________________________________________________________________________________________________ Latest  Blood pressure (!) 173/76, pulse 78, temperature (!) 97.4 F (36.3 C), temperature source Oral, resp. rate (!) 26, height 5\' 4"  (1.626 m), weight 77.1 kg, SpO2 100 %.   Review of Systems:    Pertinent positives include: shortness of breath at rest  dyspnea on exertion,  fatigue,   Constitutional:  No weight loss, night sweats, Fevers, chillsweight loss  HEENT:  No headaches, Difficulty swallowing,Tooth/dental problems,Sore throat,  No sneezing, itching, ear ache, nasal congestion, post nasal drip,  Cardio-vascular:  No chest  pain, Orthopnea, PND, anasarca, dizziness, palpitations.no Bilateral lower extremity swelling  GI:  No heartburn, indigestion, abdominal pain, nausea, vomiting, diarrhea, change in bowel habits, loss of appetite, melena, blood in stool, hematemesis Resp:  no  No excess mucus, no productive cough, No non-productive cough, No coughing up of blood.No change in color of mucus.No wheezing. Skin:  no rash or lesions. No jaundice GU:  no dysuria, change in color of urine, no urgency or frequency. No straining to urinate.  No flank pain.  Musculoskeletal:  No joint pain or no joint swelling. No decreased range of motion. No back pain.  Psych:  No change in mood or affect. No depression or anxiety. No memory loss.  Neuro: no localizing neurological complaints, no tingling, no weakness, no double vision, no gait abnormality, no slurred speech, no confusion  All systems reviewed and apart from HOPI all are negative _______________________________________________________________________________________________ Past Medical History:   Past Medical History:  Diagnosis Date  . Emphysema lung (HCC)      Past Surgical History:  Procedure Laterality Date  . TUBAL LIGATION      Social History:  Ambulatory  Cane,     reports that she has been smoking. She has been smoking about 0.50 packs per day. She has never used smokeless tobacco. No history on file for alcohol use and drug use.     Family History:   Family History  Problem Relation Age of Onset  . Lung cancer Father   . Diabetes Mellitus II Sister    ______________________________________________________________________________________________ Allergies: Allergies  Allergen Reactions  . Codeine   . Sulfa Antibiotics  Prior to Admission medications   Not on File   ___________________________________________________________________________________________________ Physical Exam: Vitals with BMI 10/07/2020 10/07/2020  10/07/2020  Height - - -  Weight - - -  BMI - - -  Systolic 173 169 846  Diastolic 76 91 85  Pulse 78 74 73    1. General:  in No Acute distress   Chronically ill  -appearing 2. Psychological: Alert and   Oriented 3. Head/ENT:    Dry Mucous Membranes                          Head Non traumatic, neck supple                          Poor Dentition 4. SKIN:  decreased Skin turgor,  Skin clean Dry and intact no rash 5. Heart: Regular rate and rhythm no Murmur, no Rub or gallop 6. Lungs:   no wheezes or crackles   7. Abdomen: Soft, non-tender, Non distended  Obese bowel sounds present 8. Lower extremities: no clubbing, cyanosis, no  edema 9. Neurologically Grossly intact, moving all 4 extremities equally  10. MSK: Normal range of motion    Chart has been reviewed  ______________________________________________________________________________________________  Assessment/Plan  80 y.o. female with medical history significant of  COPD, sinusitis, tobacco abuse     Admitted for COPD exacerbation  Present on Admission: . COPD exacerbation (HCC) -  Will initiate: Steroid taper  -  Antibiotics azithromycin - Albuterol  PRN, - scheduled duoneb,  -  Breo or Dulera at discharge   -  Mucinex.  Titrate O2 to saturation >90%. Follow patients respiratory status.  VBG     Currently mentating well no evidence of symptomatic hypercarbia  . Tobacco abuse -  - Spoke about importance of quitting spent 5 minutes discussing options for treatment, prior attempts at quitting, and dangers of smoking  -At this point patient is   interested in quitting  - refuses nicotine patch   - nursing tobacco cessation protocol   . Hypothyroidism - check TSH pt unsure what is her dose of synthroid will need clarify in AM   Other plan as per orders.  DVT prophylaxis:    Lovenox       Code Status:    Code Status: Not on file FULL CODE  as per patient   I had personally discussed CODE STATUS with patient    Family Communication:   Family not at  Bedside   Disposition Plan:     To home once workup is complete and patient is stable   Following barriers for discharge:                                                          Will need to be able to tolerate PO and have stable Vital signs                                                 Would benefit from PT/OT eval prior to DC  Ordered  Nutrition    consulted                  Consults called: none    Admission status:  ED Disposition    ED Disposition Condition Comment   Admit  Hospital Area: [facility] [[id number]]  Level of Care: Progressive Cardiac [[id number]]  Admit to Progressive based on following criteria: RESPIRATORY PROBLEMS hypoxemic/hypercapnic respiratory failure that is responsive to NIPPV (BiPAP) or High Flow Nasal Cannula (6-80 lpm). Frequent assessment/intervention, no > Q2 hrs < Q4 hrs, to maintain oxygenation and pulmonary hygiene.  Covid Evaluation: Confirmed COVID Negative  Diagnosis: COPD exacerbation (HCC) [115726]  Admitting Physician: Therisa Doyne [[id number]]  Attending Physician: Therisa Doyne [[id number]]  Estimated length of stay: 3 - 4 days  Certification:: I certify this patient will need inpatient services for at least 2 midnights         inpatient     I Expect 2 midnight stay secondary to severity of patient's current illness need for inpatient interventions justified by the following:  hemodynamic instability despite optimal treatment (tachycardia tachypnea )  Severe lab/radiological/exam abnormalities including:    COPD and extensive comorbidities including:    COPD/asthma .    That are currently affecting medical management.   I expect  patient to be hospitalized for 2 midnights requiring inpatient medical care.  Patient is at high risk for adverse outcome (such as loss of life or disability) if not treated.  Indication for inpatient stay as  follows:     Need for IV antibiotics, IV fluids, IV steroids   Level of care     tele  For  24H     Lab Results  Component Value Date   SARSCOV2NAA NEGATIVE 10/07/2020     Precautions: admitted as   Covid Negative      PPE: Used by the provider:   [id number]   eye Goggles,  Gloves      Josclyn Rosales 10/08/2020, 12:28 AM    [facility]     after 2 AM please page floor coverage PA If 7AM-7PM, please contact the day team taking care of the patient using [email or web address]   Patient was evaluated in the context of the global COVID-19 pandemic, which necessitated consideration that the patient might be at risk for infection with the SARS-CoV-2 virus that causes COVID-19. Institutional protocols and algorithms that pertain to the evaluation of patients at risk for COVID-19 are in a state of rapid change based on information released by regulatory bodies including the CDC and federal and state organizations. These policies and algorithms were followed during the patient's care.

## 2020-10-07 NOTE — ED Notes (Signed)
Report to Tom, RN

## 2020-10-07 NOTE — ED Notes (Signed)
Patient reports SOB, persistent, x3 weeks. Patient reports bronchitis frequently, and reports she has not been able to get her inhaler filled due to financial reasons.

## 2020-10-08 ENCOUNTER — Encounter: Payer: Self-pay | Admitting: Internal Medicine

## 2020-10-08 DIAGNOSIS — Z720 Tobacco use: Secondary | ICD-10-CM | POA: Diagnosis not present

## 2020-10-08 DIAGNOSIS — K219 Gastro-esophageal reflux disease without esophagitis: Secondary | ICD-10-CM | POA: Diagnosis not present

## 2020-10-08 DIAGNOSIS — J441 Chronic obstructive pulmonary disease with (acute) exacerbation: Secondary | ICD-10-CM

## 2020-10-08 DIAGNOSIS — E039 Hypothyroidism, unspecified: Secondary | ICD-10-CM | POA: Diagnosis present

## 2020-10-08 LAB — CBC WITH DIFFERENTIAL/PLATELET
Abs Immature Granulocytes: 0.13 10*3/uL — ABNORMAL HIGH (ref 0.00–0.07)
Basophils Absolute: 0 10*3/uL (ref 0.0–0.1)
Basophils Relative: 0 %
Eosinophils Absolute: 0 10*3/uL (ref 0.0–0.5)
Eosinophils Relative: 0 %
HCT: 38.3 % (ref 36.0–46.0)
Hemoglobin: 12.7 g/dL (ref 12.0–15.0)
Immature Granulocytes: 1 %
Lymphocytes Relative: 8 %
Lymphs Abs: 0.8 10*3/uL (ref 0.7–4.0)
MCH: 29.5 pg (ref 26.0–34.0)
MCHC: 33.2 g/dL (ref 30.0–36.0)
MCV: 89.1 fL (ref 80.0–100.0)
Monocytes Absolute: 0.1 10*3/uL (ref 0.1–1.0)
Monocytes Relative: 1 %
Neutro Abs: 8.3 10*3/uL — ABNORMAL HIGH (ref 1.7–7.7)
Neutrophils Relative %: 90 %
Platelets: 370 10*3/uL (ref 150–400)
RBC: 4.3 MIL/uL (ref 3.87–5.11)
RDW: 14 % (ref 11.5–15.5)
WBC: 9.4 10*3/uL (ref 4.0–10.5)
nRBC: 0 % (ref 0.0–0.2)

## 2020-10-08 LAB — MAGNESIUM: Magnesium: 2 mg/dL (ref 1.7–2.4)

## 2020-10-08 LAB — COMPREHENSIVE METABOLIC PANEL
ALT: 23 U/L (ref 0–44)
AST: 29 U/L (ref 15–41)
Albumin: 3.4 g/dL — ABNORMAL LOW (ref 3.5–5.0)
Alkaline Phosphatase: 82 U/L (ref 38–126)
Anion gap: 9 (ref 5–15)
BUN: 14 mg/dL (ref 8–23)
CO2: 25 mmol/L (ref 22–32)
Calcium: 8.9 mg/dL (ref 8.9–10.3)
Chloride: 101 mmol/L (ref 98–111)
Creatinine, Ser: 0.63 mg/dL (ref 0.44–1.00)
GFR, Estimated: >60 mL/min (ref >60)
Glucose, Bld: 169 mg/dL — ABNORMAL HIGH (ref 70–99)
Potassium: 3.9 mmol/L (ref 3.5–5.1)
Sodium: 135 mmol/L (ref 135–145)
Total Bilirubin: 0.5 mg/dL (ref 0.3–1.2)
Total Protein: 7.6 g/dL (ref 6.5–8.1)

## 2020-10-08 LAB — BLOOD GAS, VENOUS
Acid-Base Excess: 0.5 mmol/L (ref 0.0–2.0)
Bicarbonate: 24.6 mmol/L (ref 20.0–28.0)
O2 Saturation: 89.1 %
Patient temperature: 37
pCO2, Ven: 37 mmHg — ABNORMAL LOW (ref 44.0–60.0)
pH, Ven: 7.43 (ref 7.250–7.430)
pO2, Ven: 55 mmHg — ABNORMAL HIGH (ref 32.0–45.0)

## 2020-10-08 LAB — TSH: TSH: 5.54 u[IU]/mL — ABNORMAL HIGH (ref 0.350–4.500)

## 2020-10-08 LAB — PHOSPHORUS: Phosphorus: 3.8 mg/dL (ref 2.5–4.6)

## 2020-10-08 MED ORDER — SERTRALINE HCL 50 MG PO TABS
50.0000 mg | ORAL_TABLET | Freq: Every day | ORAL | Status: DC
  Administered 2020-10-08 – 2020-10-09 (×2): 50 mg via ORAL
  Filled 2020-10-08 (×2): qty 1

## 2020-10-08 MED ORDER — TRAZODONE HCL 100 MG PO TABS
100.0000 mg | ORAL_TABLET | Freq: Every evening | ORAL | Status: DC | PRN
  Filled 2020-10-08: qty 1

## 2020-10-08 MED ORDER — NICOTINE 7 MG/24HR TD PT24: 7.0000 mg | MEDICATED_PATCH | Freq: Every day | TRANSDERMAL | Status: DC

## 2020-10-08 MED ORDER — PREDNISONE 20 MG PO TABS: 40.0000 mg | ORAL_TABLET | Freq: Every day | ORAL | Status: DC

## 2020-10-08 MED ORDER — NICOTINE 21 MG/24HR TD PT24
21.0000 mg | MEDICATED_PATCH | Freq: Every day | TRANSDERMAL | Status: DC
  Filled 2020-10-08: qty 1

## 2020-10-08 MED ORDER — IPRATROPIUM BROMIDE 0.02 % IN SOLN
0.5000 mg | Freq: Four times a day (QID) | RESPIRATORY_TRACT | Status: DC
  Administered 2020-10-08 – 2020-10-09 (×6): 0.5 mg via RESPIRATORY_TRACT
  Filled 2020-10-08 (×6): qty 2.5

## 2020-10-08 MED ORDER — ACETAMINOPHEN 325 MG PO TABS: 650.0000 mg | ORAL_TABLET | Freq: Four times a day (QID) | ORAL | Status: DC | PRN

## 2020-10-08 MED ORDER — ACETAMINOPHEN 650 MG RE SUPP: 650.0000 mg | Freq: Four times a day (QID) | RECTAL | Status: DC | PRN

## 2020-10-08 MED ORDER — HYDROCODONE-ACETAMINOPHEN 5-325 MG PO TABS
1.0000 | ORAL_TABLET | ORAL | Status: DC | PRN
Start: 2020-10-08 — End: 2020-10-10

## 2020-10-08 MED ORDER — SODIUM CHLORIDE 0.9 % IV SOLN
75.0000 mL/h | INTRAVENOUS | Status: AC
  Administered 2020-10-08: 75 mL/h via INTRAVENOUS

## 2020-10-08 MED ORDER — AZITHROMYCIN 250 MG PO TABS
250.0000 mg | ORAL_TABLET | Freq: Every day | ORAL | Status: DC
  Administered 2020-10-08 – 2020-10-09 (×2): 250 mg via ORAL
  Filled 2020-10-08 (×2): qty 1

## 2020-10-08 MED ORDER — METHYLPREDNISOLONE SODIUM SUCC 40 MG IJ SOLR
40.0000 mg | Freq: Two times a day (BID) | INTRAMUSCULAR | Status: AC
  Administered 2020-10-08 – 2020-10-09 (×2): 40 mg via INTRAVENOUS
  Filled 2020-10-08 (×2): qty 1

## 2020-10-08 MED ORDER — PANTOPRAZOLE SODIUM 40 MG PO TBEC
40.0000 mg | DELAYED_RELEASE_TABLET | Freq: Every day | ORAL | Status: DC
  Administered 2020-10-08 – 2020-10-09 (×2): 40 mg via ORAL
  Filled 2020-10-08 (×2): qty 1

## 2020-10-08 MED ORDER — ENOXAPARIN SODIUM 40 MG/0.4ML ~~LOC~~ SOLN
40.0000 mg | SUBCUTANEOUS | Status: DC
  Administered 2020-10-08 – 2020-10-09 (×2): 40 mg via SUBCUTANEOUS
  Filled 2020-10-08 (×2): qty 0.4

## 2020-10-08 MED ORDER — ALBUTEROL SULFATE (2.5 MG/3ML) 0.083% IN NEBU: 2.5000 mg | INHALATION_SOLUTION | RESPIRATORY_TRACT | Status: DC | PRN

## 2020-10-08 MED ORDER — METHYLPREDNISOLONE SODIUM SUCC 40 MG IJ SOLR
40.0000 mg | Freq: Four times a day (QID) | INTRAMUSCULAR | Status: DC
  Administered 2020-10-08: 40 mg via INTRAVENOUS
  Filled 2020-10-08: qty 1

## 2020-10-08 NOTE — Care Management CC44 (Signed)
Condition Code 44 Documentation Completed  Patient Details  Name: Jackie Garcia MRN: [id number] Date of Birth: 1940/09/29   Condition Code 44 given:  Yes Patient signature on Condition Code 44 notice:  Yes Documentation of 2 MD's agreement:  Yes Code 44 added to claim:  Yes    Joseph Art, LCSWA 10/08/2020, 4:41 PM

## 2020-10-08 NOTE — Care Management Obs Status (Signed)
MEDICARE OBSERVATION STATUS NOTIFICATION   Patient Details  Name: Jackie Garcia MRN: [id number] Date of Birth: February 05, 1941   Medicare Observation Status Notification Given:  Yes    Marina Goodell 10/08/2020, 4:41 PM

## 2020-10-08 NOTE — Progress Notes (Signed)
PROGRESS NOTE    [name]   LDJ:[id number]  DOB: 03-13-1941  DOA: 10/07/2020 PCP: Oneita Hurt, No   Brief Narrative:  Jackie Garcia is an 80 year old female with COPD.  She often has episodes of bronchitis or sinusitis.  Breath is accompanied by a nonproductive cough.  She has been out of her inhalers since moving here recently from [place].  He still smokes about 3 cigarettes a day.  He on home O2 but not on it recently.  Chest x-ray reveals hyperinflation & consolidation in the right middle and right lower lobe. She is admitted for COPD exacerbation   Subjective: She is breathing a little better but not back to baseline. She has a poor appetite, cough with clear sputum and is weak.     Assessment & Plan:   Principal Problem:   COPD exacerbation-possible viral pneumonia -Ambulated with OT and pulse ox dropped to about 88% and improved to 93% after 30 seconds of rest. -Has infiltrates in her lungs on x-ray however, her procalcitonin is negative and thus she may have a viral pneumonia which is precipitating her COPD exacerbation -We will continue IV steroids, nebulizer treatments and a azithromycin and keep her in the hospital today  Active Problems:   Tobacco abuse -dc 21 mg NicoDerm patch based on the fact she only smokes 2- 3 cigarettes per day  GERD - cont PPI    Hypothyroidism Synthroid is not on her home medication list and she may have stopped this on her own -TSH is 5.540- recommend this  Be rechecked as outpt in 3 wks.   Time spent in minutes: 35 DVT prophylaxis: enoxaparin (LOVENOX) injection 40 mg Start: 10/08/20 0800  Code Status: full code Family Communication:  Level of Care: Level of care: Progressive Cardiac Disposition Plan:  Status is: Inpatient  Not inpatient appropriate, will call UM team and downgrade to OBS.   Dispo: The patient is from: Home              Anticipated d/c is to: Home              Patient currently is not medically stable  to d/c.   Difficult to place patient No  Consultants:   none Procedures:   none Antimicrobials:  Anti-infectives (From admission, onward)   Start     Dose/Rate Route Frequency Ordered Stop   10/07/20 2130  cefTRIAXone (ROCEPHIN) 1 g in sodium chloride 0.9 % 100 mL IVPB        1 g 200 mL/hr over 30 Minutes Intravenous  Once 10/07/20 2119 10/07/20 2158   10/07/20 2130  azithromycin (ZITHROMAX) 500 mg in sodium chloride 0.9 % 250 mL IVPB        500 mg 250 mL/hr over 60 Minutes Intravenous  Once 10/07/20 2119 10/08/20 0242       Objective: Vitals:   10/08/20 0629 10/08/20 0930 10/08/20 1030 10/08/20 1344  BP: (!) 150/85 (!) 145/95 (!) 145/75 (!) 155/66  Pulse: 72 72 72 75  Resp: 18  18 18   Temp:   98 F (36.7 C) 97.9 F (36.6 C)  TempSrc:   Oral Oral  SpO2: 94% 92% 94% 93%  Weight:      Height:        Intake/Output Summary (Last 24 hours) at 10/08/2020 1549 Last data filed at 10/08/2020 0242 Gross per 24 hour  Intake 350 ml  Output --  Net 350 ml   Filed Weights   10/07/20 1804  Weight:  77.1 kg    Examination: General exam: Appears comfortable  HEENT: PERRLA, oral mucosa moist, no sclera icterus or thrush Respiratory system: Clear to auscultation. Respiratory effort normal. Cardiovascular system: S1 & S2 heard, RRR.   Gastrointestinal system: Abdomen soft, non-tender, nondistended. Normal bowel sounds. Central nervous system: Alert and oriented. No focal neurological deficits. Extremities: No cyanosis, clubbing or edema Skin: No rashes or ulcers Psychiatry:  Mood & affect appropriate.     Data Reviewed: I have personally reviewed following labs and imaging studies  CBC: Recent Labs  Lab 10/07/20 1811 10/08/20 0631  WBC 10.2 9.4  NEUTROABS  --  8.3*  HGB 12.5 12.7  HCT 37.8 38.3  MCV 90.9 89.1  PLT 324 370   Basic Metabolic Panel: Recent Labs  Lab 10/07/20 1811 10/08/20 0631  NA 138 135  K 4.1 3.9  CL 105 101  CO2 23 25  GLUCOSE 101* 169*   BUN 22 14  CREATININE 0.55 0.63  CALCIUM 9.4 8.9  MG  --  2.0  PHOS  --  3.8   GFR: Estimated Creatinine Clearance: 56.4 mL/min (by C-G formula based on SCr of 0.63 mg/dL). Liver Function Tests: Recent Labs  Lab 10/07/20 1811 10/08/20 0631  AST 27 29  ALT 23 23  ALKPHOS 70 82  BILITOT 0.6 0.5  PROT 7.0 7.6  ALBUMIN 3.1* 3.4*   No results for input(s): LIPASE, AMYLASE in the last 168 hours. No results for input(s): AMMONIA in the last 168 hours. Coagulation Profile: No results for input(s): INR, PROTIME in the last 168 hours. Cardiac Enzymes: Recent Labs  Lab 10/07/20 1811  CKTOTAL 59   BNP (last 3 results) No results for input(s): PROBNP in the last 8760 hours. HbA1C: No results for input(s): HGBA1C in the last 72 hours. CBG: No results for input(s): GLUCAP in the last 168 hours. Lipid Profile: No results for input(s): CHOL, HDL, LDLCALC, TRIG, CHOLHDL, LDLDIRECT in the last 72 hours. Thyroid Function Tests: Recent Labs    10/08/20 0631  TSH 5.540*   Anemia Panel: No results for input(s): VITAMINB12, FOLATE, FERRITIN, TIBC, IRON, RETICCTPCT in the last 72 hours. Urine analysis:    Component Value Date/Time   COLORURINE YELLOW (A) 10/07/2020 2145   APPEARANCEUR CLEAR (A) 10/07/2020 2145   APPEARANCEUR Hazy 08/12/2012 1348   LABSPEC 1.026 10/07/2020 2145   LABSPEC 1.018 08/12/2012 1348   PHURINE 5.0 10/07/2020 2145   GLUCOSEU NEGATIVE 10/07/2020 2145   GLUCOSEU Negative 08/12/2012 1348   HGBUR SMALL (A) 10/07/2020 2145   BILIRUBINUR NEGATIVE 10/07/2020 2145   BILIRUBINUR Negative 08/12/2012 1348   KETONESUR NEGATIVE 10/07/2020 2145   PROTEINUR NEGATIVE 10/07/2020 2145   NITRITE NEGATIVE 10/07/2020 2145   LEUKOCYTESUR NEGATIVE 10/07/2020 2145   LEUKOCYTESUR Negative 08/12/2012 1348   Sepsis Labs: @LABRCNTIP (procalcitonin:4,lacticidven:4) ) Recent Results (from the past 240 hour(s))  Resp Panel by RT-PCR (Flu A&B, Covid) Nasopharyngeal Swab      Status: None   Collection Time: 10/07/20  9:28 PM   Specimen: Nasopharyngeal Swab; Nasopharyngeal(NP) swabs in vial transport medium  Result Value Ref Range Status   SARS Coronavirus 2 by RT PCR NEGATIVE NEGATIVE Final    Comment: (NOTE) SARS-CoV-2 target nucleic acids are NOT DETECTED.  The SARS-CoV-2 RNA is generally detectable in upper respiratory specimens during the acute phase of infection. The lowest concentration of SARS-CoV-2 viral copies this assay can detect is 138 copies/mL. A negative result does not preclude SARS-Cov-2 infection and should not be used as the  sole basis for treatment or other patient management decisions. A negative result may occur with  improper specimen collection/handling, submission of specimen other than nasopharyngeal swab, presence of viral mutation(s) within the areas targeted by this assay, and inadequate number of viral copies(<138 copies/mL). A negative result must be combined with clinical observations, patient history, and epidemiological information. The expected result is Negative.  Fact Sheet for Patients:  BloggerCourse.com  Fact Sheet for Healthcare Providers:  SeriousBroker.it  This test is no t yet approved or cleared by the [place] FDA and  has been authorized for detection and/or diagnosis of SARS-CoV-2 by FDA under an Emergency Use Authorization (EUA). This EUA will remain  in effect (meaning this test can be used) for the duration of the COVID-19 declaration under Section 564(b)(1) of the Act, 21 U.S.C.section 360bbb-3(b)(1), unless the authorization is terminated  or revoked sooner.       Influenza A by PCR NEGATIVE NEGATIVE Final   Influenza B by PCR NEGATIVE NEGATIVE Final    Comment: (NOTE) The Xpert Xpress SARS-CoV-2/FLU/RSV plus assay is intended as an aid in the diagnosis of influenza from Nasopharyngeal swab specimens and should not be used as a sole basis  for treatment. Nasal washings and aspirates are unacceptable for Xpert Xpress SARS-CoV-2/FLU/RSV testing.  Fact Sheet for Patients: BloggerCourse.com  Fact Sheet for Healthcare Providers: SeriousBroker.it  This test is not yet approved or cleared by the [place] FDA and has been authorized for detection and/or diagnosis of SARS-CoV-2 by FDA under an Emergency Use Authorization (EUA). This EUA will remain in effect (meaning this test can be used) for the duration of the COVID-19 declaration under Section 564(b)(1) of the Act, 21 U.S.C. section 360bbb-3(b)(1), unless the authorization is terminated or revoked.  Performed at [facility], [address], [place], [place] [address]          Radiology Studies: DG Chest 2 View  Result Date: 10/07/2020 CLINICAL DATA:  Shortness of breath EXAM: CHEST - 2 VIEW COMPARISON:  08/12/2012 FINDINGS: There is hyperinflation of the lungs compatible with COPD. Heart is normal size. Consolidation in the right middle lobe at the right lung base concerning for pneumonia. No confluent opacity on the left. No effusions or acute bony abnormality. IMPRESSION: COPD. Right basilar/middle lobe opacity concerning for pneumonia. Electronically Signed   By: Charlett Nose M.D.   On: 10/07/2020 18:47      Scheduled Meds: . enoxaparin (LOVENOX) injection  40 mg Subcutaneous Q24H  . ipratropium  0.5 mg Nebulization Q6H  . methylPREDNISolone (SOLU-MEDROL) injection  40 mg Intravenous Q12H   Followed by  . [START ON 10/10/2020] predniSONE  40 mg Oral Q breakfast  . nicotine  21 mg Transdermal Daily  . pantoprazole  40 mg Oral Daily  . sertraline  50 mg Oral Daily  . traZODone  100 mg Oral QHS   Continuous Infusions: . sodium chloride 75 mL/hr (10/08/20 0757)     LOS: 1 day      Calvert Cantor, MD [facility] Pager: [email or web address] 10/08/2020, 3:49 PM

## 2020-10-08 NOTE — Evaluation (Addendum)
Physical Therapy Evaluation Patient Details Name: Jackie Garcia MRN: [id number] DOB: 1940-12-29 Today's Date: 10/08/2020   History of Present Illness  Pt is an 80 y/o F admitted on 10/07/20 with c/c of SOB associated with non productive cough, mild HA, & some myalgias. PMH: COPD, sinusitis, tobacco abuse,  emphysema  Clinical Impression  Pt seen for PT evaluation with pt on room air throughout session & SpO2 90% or > on room air. Pt notes she was on home O2 but went off of it more than a year ago 2/2 cost. Pt is able to ambulate increased distances with SPC with only 1 LOB & CGA to correct during session, with overall activity limited by pt feeling SOB with PT educating pt on pursed lip breathing. Will continue to follow pt acutely to focus on high level dynamic balance, endurance & stair training.     Follow Up Recommendations Home health PT, supervision for OOB/mobility    Equipment Recommendations  None recommended by PT    Recommendations for Other Services       Precautions / Restrictions Precautions Precautions: Fall Restrictions Weight Bearing Restrictions: No      Mobility  Bed Mobility Overal bed mobility: Modified Independent             General bed mobility comments: HOB elevated    Transfers Overall transfer level: Modified independent Equipment used: Straight cane             General transfer comment: sit<>stand  Ambulation/Gait Ambulation/Gait assistance: Supervision;Min guard Gait Distance (Feet): 150 Feet Assistive device: Straight cane       General Gait Details: supervision except for 1 LOB with CGA to correct  Stairs            Wheelchair Mobility    Modified Rankin (Stroke Patients Only)       Balance Overall balance assessment: Needs assistance;Mild deficits observed, not formally tested Sitting-balance support: Feet supported;No upper extremity supported Sitting balance-Leahy Scale: Good     Standing balance  support: Single extremity supported;During functional activity Standing balance-Leahy Scale: Good                               Pertinent Vitals/Pain Pain Assessment: No/denies pain    Home Living Family/patient expects to be discharged to:: Private residence Living Arrangements: Children (daughter & granddaughter) Available Help at Discharge: Family;Available 24 hours/day Type of Home: House Home Access: Stairs to enter Entrance Stairs-Rails: Right Entrance Stairs-Number of Steps: 3 Home Layout: One level Home Equipment: Shower seat;Cane - single point;Hand held shower head;Grab bars - tub/shower      Prior Function           Comments: Daughter [name] helps with washing and doing hair. Independent with dressing and bathing. Does not cook or drive. Likes playing with grandchildren and watching tv. uses SPC for household mobility. Limited community mobility     Hand Dominance   Dominant Hand: Right    Extremity/Trunk Assessment   Upper Extremity Assessment Upper Extremity Assessment: Overall WFL for tasks assessed    Lower Extremity Assessment Lower Extremity Assessment: Generalized weakness       Communication      Cognition Arousal/Alertness: Awake/alert Behavior During Therapy: WFL for tasks assessed/performed Overall Cognitive Status: Within Functional Limits for tasks assessed  General Comments: Appears AxOx4 but told OT her home has level entry, told PT her home has 3 STE.      General Comments General comments (skin integrity, edema, etc.): Educated pt on pursed lip breathing as gait distances are limited by SOB despite SpO2 >/= 90%    Exercises     Assessment/Plan    PT Assessment Patient needs continued PT services  PT Problem List Decreased mobility;Cardiopulmonary status limiting activity;Decreased balance;Decreased activity tolerance       PT Treatment Interventions DME  instruction;Therapeutic exercise;Balance training;Gait training;Stair training;Functional mobility training;Patient/family education;Neuromuscular re-education    PT Goals (Current goals can be found in the Care Plan section)  Acute Rehab PT Goals Patient Stated Goal: get a room PT Goal Formulation: With patient Time For Goal Achievement: 10/22/20 Potential to Achieve Goals: Good    Frequency Min 2X/week   Barriers to discharge        Co-evaluation               AM-PAC PT "6 Clicks" Mobility  Outcome Measure Help needed turning from your back to your side while in a flat bed without using bedrails?: None Help needed moving from lying on your back to sitting on the side of a flat bed without using bedrails?: None Help needed moving to and from a bed to a chair (including a wheelchair)?: None Help needed standing up from a chair using your arms (e.g., wheelchair or bedside chair)?: None Help needed to walk in hospital room?: A Little Help needed climbing 3-5 steps with a railing? : A Little 6 Click Score: 22    End of Session   Activity Tolerance:  (limited 2/2 SOB) Patient left: in bed;with call bell/phone within reach   PT Visit Diagnosis: Unsteadiness on feet (R26.81);Muscle weakness (generalized) (M62.81)    Time: 6644-0347 PT Time Calculation (min) (ACUTE ONLY): 10 min   Charges:   PT Evaluation $PT Eval Low Complexity: 1 Low          Aleda Grana, PT, DPT 10/08/20, 2:13 PM   Sandi Mariscal 10/08/2020, 2:11 PM

## 2020-10-08 NOTE — Evaluation (Signed)
Occupational Therapy Evaluation Patient Details Name: Jackie Garcia MRN: [id number] DOB: 1941/06/30 Today's Date: 10/08/2020    History of Present Illness Pt is an 80 y/o F admitted on 10/07/20 with c/c of SOB associated with non productive cough, mild HA, & some myalgias. PMH: COPD, sinusitis, tobacco abuse,  emphysema   Clinical Impression   Pt seen for OT evaluation this date. Upon arrival to room, pt on RA seated EOB and eating lunch. Pt agreeable to OT evaluation. Prior to admission, pt was mod-independent with dressing, bathing, toileting, and functional mobility of short household distances with SPC. Pt was living in [place] until recently when she moved to daughter's one-level home in [place]. Pt reports that she does not drive or cook, and receives some assist from daughter for grooming tasks. Pt currently requires SUPERVISION for dressing, toileting, standing grooming, and functional mobility with SPC d/t decreased activity tolerance. Of note, SpO2 88% following walk to bathroom and toileting, with SpO2 returning to 93% within 30sec of activity cessation; RN informed. Pt left supine in bed and in no acute distress. Pt would benefit from additional skilled OT services to maximize return to PLOF and minimize risk of future falls, injury, caregiver burden, and readmission. Upon discharge, recommend supervision/assistance from family PRN and HHOT services.      Follow Up Recommendations  Home health OT;Supervision - Intermittent    Equipment Recommendations  None recommended by OT       Precautions / Restrictions Precautions Precautions: Fall Restrictions Weight Bearing Restrictions: No      Mobility Bed Mobility Overal bed mobility: Modified Independent             General bed mobility comments: HOB elevated    Transfers Overall transfer level: Modified independent Equipment used: Straight cane             General transfer comment: sit<>stand from ED bed     Balance Overall balance assessment: Needs assistance;Mild deficits observed, not formally tested Sitting-balance support: No upper extremity supported;Feet unsupported Sitting balance-Leahy Scale: Good Sitting balance - Comments: Good sitting balance at EOB   Standing balance support: During functional activity;Bilateral upper extremity supported Standing balance-Leahy Scale: Good Standing balance comment: Good standing balance during standing grooming                           ADL either performed or assessed with clinical judgement   ADL Overall ADL's : Needs assistance/impaired     Grooming: Wash/dry hands;Supervision/safety;Standing;Brushing hair;Set up;Sitting               Lower Body Dressing: Supervision/safety;Sit to/from stand Lower Body Dressing Details (indicate cue type and reason): Pt able to don slip on shoes at bedside and pull pants/briefs down during toileting Toilet Transfer: Supervision/safety;Regular Toilet;Ambulation;Grab bars   Toileting- Clothing Manipulation and Hygiene: Supervision/safety;Sitting/lateral lean Toileting - Clothing Manipulation Details (indicate cue type and reason): Pt able to perform peri-care and pull pants/briefs up with SUPERVISION     Functional mobility during ADLs: Supervision/safety;Cane                    Pertinent Vitals/Pain Pain Assessment: No/denies pain     Hand Dominance Right   Extremity/Trunk Assessment Upper Extremity Assessment Upper Extremity Assessment: Overall WFL for tasks assessed   Lower Extremity Assessment Lower Extremity Assessment: Generalized weakness       Communication     Cognition Arousal/Alertness: Awake/alert Behavior During Therapy: WFL for tasks  assessed/performed Overall Cognitive Status: Within Functional Limits for tasks assessed                                 General Comments: Oriented to self, place, and situation, however inconsistent reports  with home set-up. Agreeable and pleasant throughout.   General Comments  Pt on RA. SpO2 88% following walk to bathroom and toileting. SpO2 returned to 93% within 30sec of activity cessation.            Home Living Family/patient expects to be discharged to:: Private residence Living Arrangements: Children (daughter & granddaughter) Available Help at Discharge: Family;Available 24 hours/day Type of Home: House Home Access: Stairs to enter [employer] of Steps: 3 Entrance Stairs-Rails: Right Home Layout: One level     Bathroom Shower/Tub: Chief Strategy Officer: Standard     Home Equipment: Shower seat;Cane - single point;Hand held shower head;Grab bars - tub/shower          Prior Functioning/Environment          Comments: Daughter Jackie Garcia) helps with washing and doing hair. Independent with dressing and bathing. Does not cook or drive. Likes playing with grandchildren and watching tv. uses SPC for household mobility. Limited community mobility        OT Problem List: Decreased activity tolerance      OT Treatment/Interventions: Self-care/ADL training;Therapeutic exercise;Energy conservation;DME and/or AE instruction;Therapeutic activities;Patient/family education;Balance training    OT Goals(Current goals can be found in the care plan section) Acute Rehab OT Goals Patient Stated Goal: to get a better room OT Goal Formulation: With patient Time For Goal Achievement: 10/22/20 Potential to Achieve Goals: Good ADL Goals Pt Will Perform Grooming: with modified independence;standing Pt Will Perform Lower Body Dressing: with modified independence;sit to/from stand Pt Will Transfer to Toilet: with modified independence;regular height toilet;ambulating  OT Frequency: Min 1X/week    AM-PAC OT "6 Clicks" Daily Activity     Outcome Measure Help from another person eating meals?: None Help from another person taking care of personal grooming?: A  Little Help from another person toileting, which includes using toliet, bedpan, or urinal?: A Little Help from another person bathing (including washing, rinsing, drying)?: A Little Help from another person to put on and taking off regular upper body clothing?: None Help from another person to put on and taking off regular lower body clothing?: A Little 6 Click Score: 20   End of Session Equipment Utilized During Treatment: Other (comment) [facility]) Nurse Communication: Mobility status;Other (comment) (SpO2)  Activity Tolerance: Patient tolerated treatment well Patient left: in bed;with call bell/phone within reach  OT Visit Diagnosis: Unsteadiness on feet (R26.81)                Time: 8588-5027 OT Time Calculation (min): 17 min Charges:  OT General Charges $OT Visit: 1 Visit OT Evaluation $OT Eval Moderate Complexity: 1 Mod  Matthew Folks, OTR/L [facility] [phone]

## 2020-10-09 DIAGNOSIS — J129 Viral pneumonia, unspecified: Secondary | ICD-10-CM | POA: Diagnosis not present

## 2020-10-09 DIAGNOSIS — K219 Gastro-esophageal reflux disease without esophagitis: Secondary | ICD-10-CM | POA: Diagnosis not present

## 2020-10-09 DIAGNOSIS — J441 Chronic obstructive pulmonary disease with (acute) exacerbation: Secondary | ICD-10-CM | POA: Diagnosis not present

## 2020-10-09 DIAGNOSIS — Z720 Tobacco use: Secondary | ICD-10-CM | POA: Diagnosis not present

## 2020-10-09 LAB — URINE CULTURE

## 2020-10-09 MED ORDER — ALBUTEROL SULFATE HFA 108 (90 BASE) MCG/ACT IN AERS: 2.0000 | INHALATION_SPRAY | Freq: Four times a day (QID) | RESPIRATORY_TRACT | 2 refills | Status: DC | PRN

## 2020-10-09 MED ORDER — PREDNISONE 20 MG PO TABS: 40.0000 mg | ORAL_TABLET | Freq: Every day | ORAL | 0 refills | Status: DC

## 2020-10-09 MED ORDER — AZITHROMYCIN 250 MG PO TABS: 250.0000 mg | ORAL_TABLET | Freq: Every day | ORAL | 0 refills | Status: DC

## 2020-10-09 MED ORDER — BUDESONIDE-FORMOTEROL FUMARATE 80-4.5 MCG/ACT IN AERO: 2.0000 | INHALATION_SPRAY | Freq: Two times a day (BID) | RESPIRATORY_TRACT | 12 refills | Status: DC

## 2020-10-09 NOTE — Discharge Summary (Addendum)
Physician Discharge Summary  Jackie Garcia SNK:[id number] DOB: [birth year] DOA: 10/07/2020  PCP: Pcp, No  Admit date: 10/07/2020 Discharge date: 10/09/2020  Admitted From: home  Disposition:  homed   Recommendations for Outpatient Follow-up:  1. Needs repeat TSH in 1 month 2. Continue to encourage to quit smoking 3. TOC contacted today to help her find a PCP  Home Health:  none  Discharge Condition:  stable   CODE STATUS:  Full code   Diet recommendation:  Heart healthy Consultations:  none  Procedures/Studies: . none   Discharge Diagnoses:  Principal Problem:   COPD exacerbation / viral pneumonia Active Problems: Possible HTN   Tobacco abuse  Abnormal TSH  GERD     Brief Summary: Jackie Garcia is an 80 year old female with COPD.  She often has episodes of bronchitis or sinusitis.  Breath is accompanied by a nonproductive cough.  She has been out of her inhalers since moving here recently from [place].  She still smokes about 3 cigarettes a day.  She was previously on home O2 but not on it recently.  Chest x-ray reveals hyperinflation & consolidation in the right middle and right lower lobe. She is admitted for COPD exacerbation, viral pneumonia and hypoxia.  Hospital Course:  Principal Problem:   COPD exacerbation-possible viral pneumonia   Acute hypoxic resp failure -Ambulated with OT and pulse ox dropped to about 88% and improved to 93% after 30 seconds of rest. -Has infiltrates in her lungs on x-ray however, her procalcitonin is negative and thus she may have a viral pneumonia which is precipitating her COPD exacerbation - treated with IV steroids, nebulizer treatments and a azithromycin  - improved today- ok to dc home - new inhalers prescribed along with Prednisone and Azithromycin - she needs outpt PFTs  Active Problems:  HTN - ? If due to steroids and acute stress- needs outpt f/u    Tobacco abuse - advised to stop smoking  completely  GERD - cont PPI    Hypothyroidism? Synthroid is not on her home medication list and she may have stopped this on her own -TSH is 5.540- as this may be falsely elevated due to sick euthyroid syndrome, I recommend this be rechecked as outpt in 3 wks.     Discharge Exam: Vitals:   10/09/20 0312 10/09/20 0751  BP: (!) 156/67 (!) 151/70  Pulse: 67 63  Resp: (!) 21 15  Temp: 98.6 F (37 C) 99.4 F (37.4 C)  SpO2: 91% (!) 88%   Vitals:   10/08/20 2123 10/09/20 0300 10/09/20 0312 10/09/20 0751  BP:   (!) 156/67 (!) 151/70  Pulse: 65  67 63  Resp: 20  (!) 21 15  Temp:   98.6 F (37 C) 99.4 F (37.4 C)  TempSrc:   Oral Oral  SpO2: 96% 97% 91% (!) 88%  Weight:   77.8 kg   Height:        General: Pt is alert, awake, not in acute distress Cardiovascular: RRR, S1/S2 +, no rubs, no gallops Respiratory: CTA bilaterally, no wheezing, no rhonchi Abdominal: Soft, NT, ND, bowel sounds + Extremities: no edema, no cyanosis   Discharge Instructions  Discharge Instructions    Diet - low sodium heart healthy   Complete by: As directed    Increase activity slowly   Complete by: As directed      Allergies as of 10/09/2020      Reactions   Naproxen Swelling   Sulfa Antibiotics Rash  Codeine       Medication List    TAKE these medications   azithromycin 250 MG tablet Commonly known as: ZITHROMAX Take 1 tablet (250 mg total) by mouth daily. Start taking on: October 10, 2020   omeprazole 20 MG capsule Commonly known as: PRILOSEC Take 20 mg by mouth daily.   ondansetron 4 MG tablet Commonly known as: ZOFRAN Take 4 mg by mouth every 8 (eight) hours as needed for nausea/vomiting.   predniSONE 20 MG tablet Commonly known as: DELTASONE Take 2 tablets (40 mg total) by mouth daily with breakfast. Start taking on: October 10, 2020   sertraline 50 MG tablet Commonly known as: ZOLOFT Take 50 mg by mouth daily.   traZODone 100 MG tablet Commonly known as:  DESYREL Take 100 mg by mouth at bedtime.       Allergies  Allergen Reactions  . Naproxen Swelling  . Sulfa Antibiotics Rash  . Codeine       DG Chest 2 View  Result Date: 10/07/2020 CLINICAL DATA:  Shortness of breath EXAM: CHEST - 2 VIEW COMPARISON:  08/12/2012 FINDINGS: There is hyperinflation of the lungs compatible with COPD. Heart is normal size. Consolidation in the right middle lobe at the right lung base concerning for pneumonia. No confluent opacity on the left. No effusions or acute bony abnormality. IMPRESSION: COPD. Right basilar/middle lobe opacity concerning for pneumonia. Electronically Signed   By: Charlett NoseKevin  Dover M.D.   On: 10/07/2020 18:47      The results of significant diagnostics from this hospitalization (including imaging, microbiology, ancillary and laboratory) are listed below for reference.     Microbiology: Recent Results (from the past 240 hour(s))  Resp Panel by RT-PCR (Flu A&B, Covid) Nasopharyngeal Swab     Status: None   Collection Time: 10/07/20  9:28 PM   Specimen: Nasopharyngeal Swab; Nasopharyngeal(NP) swabs in vial transport medium  Result Value Ref Range Status   SARS Coronavirus 2 by RT PCR NEGATIVE NEGATIVE Final    Comment: (NOTE) SARS-CoV-2 target nucleic acids are NOT DETECTED.  The SARS-CoV-2 RNA is generally detectable in upper respiratory specimens during the acute phase of infection. The lowest concentration of SARS-CoV-2 viral copies this assay can detect is 138 copies/mL. A negative result does not preclude SARS-Cov-2 infection and should not be used as the sole basis for treatment or other patient management decisions. A negative result may occur with  improper specimen collection/handling, submission of specimen other than nasopharyngeal swab, presence of viral mutation(s) within the areas targeted by this assay, and inadequate number of viral copies(<138 copies/mL). A negative result must be combined with clinical  observations, patient history, and epidemiological information. The expected result is Negative.  Fact Sheet for Patients:  BloggerCourse.comhttps://www.fda.gov/media/152166/download  Fact Sheet for Healthcare Providers:  SeriousBroker.ithttps://www.fda.gov/media/152162/download  This test is no t yet approved or cleared by the [place] FDA and  has been authorized for detection and/or diagnosis of SARS-CoV-2 by FDA under an Emergency Use Authorization (EUA). This EUA will remain  in effect (meaning this test can be used) for the duration of the COVID-19 declaration under Section 564(b)(1) of the Act, 21 U.S.C.section 360bbb-3(b)(1), unless the authorization is terminated  or revoked sooner.       Influenza A by PCR NEGATIVE NEGATIVE Final   Influenza B by PCR NEGATIVE NEGATIVE Final    Comment: (NOTE) The Xpert Xpress SARS-CoV-2/FLU/RSV plus assay is intended as an aid in the diagnosis of influenza from Nasopharyngeal swab specimens and should not  be used as a sole basis for treatment. Nasal washings and aspirates are unacceptable for Xpert Xpress SARS-CoV-2/FLU/RSV testing.  Fact Sheet for Patients: BloggerCourse.com  Fact Sheet for Healthcare Providers: SeriousBroker.it  This test is not yet approved or cleared by the [place] FDA and has been authorized for detection and/or diagnosis of SARS-CoV-2 by FDA under an Emergency Use Authorization (EUA). This EUA will remain in effect (meaning this test can be used) for the duration of the COVID-19 declaration under Section 564(b)(1) of the Act, 21 U.S.C. section 360bbb-3(b)(1), unless the authorization is terminated or revoked.  Performed at [facility], [address] [place], [place] [address]   Urine Culture     Status: Abnormal   Collection Time: 10/07/20  9:45 PM   Specimen: Urine, Random  Result Value Ref Range Status   Specimen Description   Final    URINE, RANDOM Performed  at [facility], [address], [place], [place] [address]    Special Requests   Final    NONE Performed at [facility], [address] [place], [place] [address]    Culture MULTIPLE SPECIES PRESENT, SUGGEST RECOLLECTION (A)  Final   Report Status 10/09/2020 FINAL  Final     Labs: BNP (last 3 results) No results for input(s): BNP in the last 8760 hours. Basic Metabolic Panel: Recent Labs  Lab 10/07/20 1811 10/08/20 0631  NA 138 135  K 4.1 3.9  CL 105 101  CO2 23 25  GLUCOSE 101* 169*  BUN 22 14  CREATININE 0.55 0.63  CALCIUM 9.4 8.9  MG  --  2.0  PHOS  --  3.8   Liver Function Tests: Recent Labs  Lab 10/07/20 1811 10/08/20 0631  AST 27 29  ALT 23 23  ALKPHOS 70 82  BILITOT 0.6 0.5  PROT 7.0 7.6  ALBUMIN 3.1* 3.4*   No results for input(s): LIPASE, AMYLASE in the last 168 hours. No results for input(s): AMMONIA in the last 168 hours. CBC: Recent Labs  Lab 10/07/20 1811 10/08/20 0631  WBC 10.2 9.4  NEUTROABS  --  8.3*  HGB 12.5 12.7  HCT 37.8 38.3  MCV 90.9 89.1  PLT 324 370   Cardiac Enzymes: Recent Labs  Lab 10/07/20 1811  CKTOTAL 59   BNP: Invalid input(s): POCBNP CBG: No results for input(s): GLUCAP in the last 168 hours. D-Dimer No results for input(s): DDIMER in the last 72 hours. Hgb A1c No results for input(s): HGBA1C in the last 72 hours. Lipid Profile No results for input(s): CHOL, HDL, LDLCALC, TRIG, CHOLHDL, LDLDIRECT in the last 72 hours. Thyroid function studies Recent Labs    10/08/20 0631  TSH 5.540*   Anemia work up No results for input(s): VITAMINB12, FOLATE, FERRITIN, TIBC, IRON, RETICCTPCT in the last 72 hours. Urinalysis    Component Value Date/Time   COLORURINE YELLOW (A) 10/07/2020 2145   APPEARANCEUR CLEAR (A) 10/07/2020 2145   APPEARANCEUR Hazy 08/12/2012 1348   LABSPEC 1.026 10/07/2020 2145   LABSPEC 1.018 08/12/2012 1348   PHURINE 5.0 10/07/2020 2145   GLUCOSEU NEGATIVE 10/07/2020  2145   GLUCOSEU Negative 08/12/2012 1348   HGBUR SMALL (A) 10/07/2020 2145   BILIRUBINUR NEGATIVE 10/07/2020 2145   BILIRUBINUR Negative 08/12/2012 1348   KETONESUR NEGATIVE 10/07/2020 2145   PROTEINUR NEGATIVE 10/07/2020 2145   NITRITE NEGATIVE 10/07/2020 2145   LEUKOCYTESUR NEGATIVE 10/07/2020 2145   LEUKOCYTESUR Negative 08/12/2012 1348   Sepsis Labs Invalid input(s): PROCALCITONIN,  WBC,  LACTICIDVEN Microbiology Recent Results (from the past 240 hour(s))  Resp Panel by RT-PCR (Flu A&B, Covid) Nasopharyngeal Swab     Status: None   Collection Time: 10/07/20  9:28 PM   Specimen: Nasopharyngeal Swab; Nasopharyngeal(NP) swabs in vial transport medium  Result Value Ref Range Status   SARS Coronavirus 2 by RT PCR NEGATIVE NEGATIVE Final    Comment: (NOTE) SARS-CoV-2 target nucleic acids are NOT DETECTED.  The SARS-CoV-2 RNA is generally detectable in upper respiratory specimens during the acute phase of infection. The lowest concentration of SARS-CoV-2 viral copies this assay can detect is 138 copies/mL. A negative result does not preclude SARS-Cov-2 infection and should not be used as the sole basis for treatment or other patient management decisions. A negative result may occur with  improper specimen collection/handling, submission of specimen other than nasopharyngeal swab, presence of viral mutation(s) within the areas targeted by this assay, and inadequate number of viral copies(<138 copies/mL). A negative result must be combined with clinical observations, patient history, and epidemiological information. The expected result is Negative.  Fact Sheet for Patients:  BloggerCourse.com  Fact Sheet for Healthcare Providers:  SeriousBroker.it  This test is no t yet approved or cleared by the [place] FDA and  has been authorized for detection and/or diagnosis of SARS-CoV-2 by FDA under an Emergency Use Authorization  (EUA). This EUA will remain  in effect (meaning this test can be used) for the duration of the COVID-19 declaration under Section 564(b)(1) of the Act, 21 U.S.C.section 360bbb-3(b)(1), unless the authorization is terminated  or revoked sooner.       Influenza A by PCR NEGATIVE NEGATIVE Final   Influenza B by PCR NEGATIVE NEGATIVE Final    Comment: (NOTE) The Xpert Xpress SARS-CoV-2/FLU/RSV plus assay is intended as an aid in the diagnosis of influenza from Nasopharyngeal swab specimens and should not be used as a sole basis for treatment. Nasal washings and aspirates are unacceptable for Xpert Xpress SARS-CoV-2/FLU/RSV testing.  Fact Sheet for Patients: BloggerCourse.com  Fact Sheet for Healthcare Providers: SeriousBroker.it  This test is not yet approved or cleared by the [place] FDA and has been authorized for detection and/or diagnosis of SARS-CoV-2 by FDA under an Emergency Use Authorization (EUA). This EUA will remain in effect (meaning this test can be used) for the duration of the COVID-19 declaration under Section 564(b)(1) of the Act, 21 U.S.C. section 360bbb-3(b)(1), unless the authorization is terminated or revoked.  Performed at [facility], [address] [place], [place] [address]   Urine Culture     Status: Abnormal   Collection Time: 10/07/20  9:45 PM   Specimen: Urine, Random  Result Value Ref Range Status   Specimen Description   Final    URINE, RANDOM Performed at [facility], [address], [place], [place] [address]    Special Requests   Final    NONE Performed at [facility], [address] [place], [place] [address]    Culture MULTIPLE SPECIES PRESENT, SUGGEST RECOLLECTION (A)  Final   Report Status 10/09/2020 FINAL  Final     Time coordinating discharge in minutes: 65  SIGNED:   Calvert Cantor, MD  [facility] 10/09/2020, 10:23 AM

## 2020-10-09 NOTE — TOC Initial Note (Addendum)
Transition of Care [facility]) - Initial/Assessment Note    Patient Details  Name: Jackie Garcia MRN: [id number] Date of Birth: 1941/01/04  [facility] CM/SW Contact:    Eileen Stanford, LCSW Phone Number: 10/09/2020, 2:08 PM  Clinical Narrative:    CSW met with pt at bedside. Pt is refusing HH stating she has a son who will assist in her care. Pt is also refusing DME stating she walks with a cain at home. Pt states she lives with her daughter (brenda) granddaughter, her granddaughters husband, and their kids. Pt also has two sons. CSW will reach out to sons to determine if they can transport pt to appointments-- that way CSW can give pt referral to [facility] for PCP.        CSW spoke with Karn Pickler (pts son) and his wife (pt's DIL) [name] and [name] will come pick up pt around 5-6 this evening. Karn Pickler does not drive, pt's other son lives in [place]. Pt's daughter also does not drive and pt's granddaughter works all day. [name], pt's DIL, states she has a flexible job and can take pt to appointments if CSW can establish her a PCP. CSW will provide [facility] with referral. RN notified of pick up time.         Expected Discharge Plan: Home/Self Care Barriers to Discharge: No Barriers Identified   Patient Goals and CMS Choice Patient states their goals for this hospitalization and ongoing recovery are:: to go home      Expected Discharge Plan and Services Expected Discharge Plan: Home/Self Care In-house Referral: Clinical Social Work   Post Acute Care Choice: NA Living arrangements for the past 2 months: Single Family Home Expected Discharge Date: 10/09/20                 DME Agency: NA       HH Arranged: NA          Prior Living Arrangements/Services Living arrangements for the past 2 months: Single Family Home Lives with:: Relatives Patient language and need for interpreter reviewed:: Yes Do you feel safe going back to the place where you live?: Yes       Need for Family Participation in Patient Care: Yes (Comment) Care giver support system in place?: Yes (comment)   Criminal Activity/Legal Involvement Pertinent to Current Situation/Hospitalization: No - Comment as needed  Activities of Daily Living Home Assistive Devices/Equipment: Cane (specify quad or straight) ADL Screening (condition at time of admission) Patient's cognitive ability adequate to safely complete daily activities?: Yes Is the patient deaf or have difficulty hearing?: No Does the patient have difficulty seeing, even when wearing glasses/contacts?: No Does the patient have difficulty concentrating, remembering, or making decisions?: No Patient able to express need for assistance with ADLs?: Yes Does the patient have difficulty dressing or bathing?: No Independently performs ADLs?: Yes (appropriate for developmental age) Does the patient have difficulty walking or climbing stairs?: No Weakness of Legs: Both Weakness of Arms/Hands: None  Permission Sought/Granted Permission sought to share information with : Family Supports Permission granted to share information with : Yes, Verbal Permission Granted  Share Information with NAME: Karn Pickler     Permission granted to share info w Relationship: son  Permission granted to share info w Contact Information: [phone]  Emotional Assessment Appearance:: Appears stated age Attitude/Demeanor/Rapport: Engaged Affect (typically observed): Accepting,Appropriate Orientation: : Oriented to Situation,Oriented to  Time,Oriented to Place,Oriented to Self Alcohol / Substance Use: Not Applicable Psych Involvement: No (comment)  Admission diagnosis:  COPD exacerbation ([place]) [J44.1] Acute cystitis without hematuria [N30.00] Patient Active Problem List   Diagnosis Date Noted  . Hypothyroidism 10/08/2020  . COPD exacerbation ([place]) 10/07/2020  . Tobacco abuse 10/07/2020   PCP:  Pcp, No Pharmacy:   [id number]  [place]Phone: [phone]: [phone]    Social Determinants of Health (SDOH) Interventions    Readmission Risk Interventions No flowsheet data found.

## 2020-10-09 NOTE — Discharge Instructions (Signed)
To improve your health:  Wear a mask at all time when outside the home.  Have your thyroid function checked in 1 month. Take your inhalers as prescribed. Please stop smoking completely. Find a local PCP ASAP.   You were cared for by a hospitalist during your hospital stay. If you have any questions about your discharge medications or the care you received while you were in the hospital after you are discharged, you can call the unit and asked to speak with the hospitalist on call if the hospitalist that took care of you is not available. Once you are discharged, your primary care physician will handle any further medical issues.   Please note that NO REFILLS for any discharge medications will be authorized once you are discharged, as it is imperative that you return to your primary care physician (or establish a relationship with a primary care physician if you do not have one) for your aftercare needs so that they can reassess your need for medications and monitor your lab values.  Please take all your medications with you for your next visit with your Primary MD. Please ask your Primary MD to get all Hospital records sent to his/her office. Please request your Primary MD to go over all hospital test results at the follow up.   If you experience worsening of your admission symptoms, develop shortness of breath, chest pain, suicidal or homicidal thoughts or a life threatening emergency, you must seek medical attention immediately by calling 911 or calling your MD.   Bonita Quin must read the complete instructions/literature along with all the possible adverse reactions/side effects for all the medicines you take including new medications that have been prescribed to you. Take new medicines after you have completely understood and accpet all the possible adverse reactions/side effects.    Do not drive when taking pain medications or sedatives.     Do not take more than prescribed Pain, Sleep and Anxiety  Medications   If you have smoked or chewed Tobacco in the last 2 yrs please stop. Stop any regular alcohol  and or recreational drug use.   Wear Seat belts while driving.

## 2021-04-20 ENCOUNTER — Ambulatory Visit (INDEPENDENT_AMBULATORY_CARE_PROVIDER_SITE_OTHER): Payer: Medicare HMO

## 2021-04-20 ENCOUNTER — Ambulatory Visit
Admission: EM | Admit: 2021-04-20 | Discharge: 2021-04-20 | Disposition: A | Discharge status: Home / Self Care | Payer: Medicare HMO | Attending: Emergency Medicine | Admitting: Emergency Medicine

## 2021-04-20 ENCOUNTER — Other Ambulatory Visit: Payer: Self-pay

## 2021-04-20 DIAGNOSIS — Z20822 Contact with and (suspected) exposure to covid-19: Secondary | ICD-10-CM | POA: Insufficient documentation

## 2021-04-20 DIAGNOSIS — J0100 Acute maxillary sinusitis, unspecified: Secondary | ICD-10-CM

## 2021-04-20 DIAGNOSIS — R059 Cough, unspecified: Secondary | ICD-10-CM | POA: Diagnosis not present

## 2021-04-20 DIAGNOSIS — R062 Wheezing: Secondary | ICD-10-CM | POA: Diagnosis not present

## 2021-04-20 DIAGNOSIS — J441 Chronic obstructive pulmonary disease with (acute) exacerbation: Secondary | ICD-10-CM | POA: Insufficient documentation

## 2021-04-20 HISTORY — DX: Disorder of thyroid, unspecified: E07.9

## 2021-04-20 LAB — POC SARS CORONAVIRUS 2 AG: SARSCOV2ONAVIRUS 2 AG: NEGATIVE

## 2021-04-20 MED ORDER — FLUTICASONE PROPIONATE 50 MCG/ACT NA SUSP: 2.0000 | Freq: Every day | NASAL | 0 refills | Status: DC

## 2021-04-20 MED ORDER — ALBUTEROL SULFATE HFA 108 (90 BASE) MCG/ACT IN AERS: 2.0000 | INHALATION_SPRAY | RESPIRATORY_TRACT | 0 refills | Status: AC | PRN

## 2021-04-20 MED ORDER — AMOXICILLIN-POT CLAVULANATE 875-125 MG PO TABS: 1.0000 | ORAL_TABLET | Freq: Two times a day (BID) | ORAL | 0 refills | Status: DC

## 2021-04-20 MED ORDER — AEROCHAMBER PLUS MISC: 2 refills | Status: AC

## 2021-04-20 NOTE — Discharge Instructions (Addendum)
Finish the prednisone and Augmentin, even if you feel better.  2 puffs from your albuterol inhaler every 4 hours for 2 days, then every 6 hours for 2 days, then as needed.  You may back off on the albuterol if you start to feel better sooner.  Saline nasal irrigation with a NeilMed sinus rinse and distilled water as often as you want.  COVID will be back tomorrow.  Follow-up with a primary care provider of your choice, see list below.  Go immediately to the ER if she gets worse in any way, shape or form, altered mental status, increased work of breathing, or other concerns  Here is a list of primary care providers who are taking new patients:  Dr. Elizabeth Sauer [address] [place] [address] [phone]  [facility] at [facility] [address] [place], [place] [address] [phone]  [facility] Primary Care [place] [address]  [place] [address]  [phone]  [facility] [address] [place], [place] [address] [phone]  [facility] [address] [place]  [phone] [place], [place] [address]  Here are clinics/ other resources who will see you if you do not have insurance. Some have certain criteria that you must meet. Call them and find out what they are:  [facility]: [address], [place], [place] [address] Phone: [phone] Hours: First and Third Saturdays of each Month, 9 a.m. - 1 p.m.  [facility]: [address], [address] [place], [place] [address] Phone: [phone] Hours: Tuesday, 4 p.m. - 8 p.m. Thursday, 1 p.m. - 8 p.m. Wednesday, 9 a.m. - [facility] [address], [place], [place] [address] Phone: [phone] Pharmacy Phone Number: [phone] Dental Phone Number: [phone] [facility] Insurance Help: [phone]  Dental Hours: Monday - Thursday, 8 a.m. - 6 p.m.  [facility] [address], [place], [place] [address] Phone: [phone] Pharmacy Phone  Number: [phone] [facility] Insurance Help: [phone]  [facility] [address] [place] [place], [place] [address] Phone: [phone] Pharmacy Phone Number: [phone] [facility] Insurance Help: [phone]  [facility] [address] [place], [place] [address] Phone: [phone] [facility] Insurance Help: [phone]   [facility] [address] [place], [place] [address] Phone: [phone]  Go to www.goodrx.com  or www.costplusdrugs.com to look up your medications. This will give you a list of where you can find your prescriptions at the most affordable prices. Or ask the pharmacist what the cash price is, or if they have any other discount programs available to help make your medication more affordable. This can be less expensive than what you would pay with insurance.

## 2021-04-20 NOTE — ED Provider Notes (Signed)
HPI  SUBJECTIVE:  Jackie Garcia is a 80 y.o. female who presents with 1 week of URI symptoms with nasal congestion, cough, rhinorrhea, sinus pain and pressure.  She reports wheezing, shortness of breath, worsening dyspnea on exertion.  The sputum has not changed in color or amount since getting sick.  No body aches, headaches, sore throat, loss of sense of smell or taste, nausea, vomiting, diarrhea, abdominal pain, fevers.  No known COVID exposure.  She got 1 COVID-vaccine.  Daughter states that patient is unable to sleep at night secondary to the cough.  She has decreased appetite, but is drinking well.  No signs of respiratory fatigue, altered mental status.  She ran out of her albuterol and Symbicort yesterday, but has refills waiting for her at the pharmacy.  Daughter states that she does not have a mouthpiece for her nebulizer although she has a machine and medication ready for it.  No antibiotics in the past 3 months.  She took Alka-Seltzer plus within 6 hours of evaluation.  She has also been taking Mucinex, vitamins.  No alleviating factors.  Symptoms worse with exertion, lying down.  Patient has a past medical history of COPD on Symbicort  bid and albuterol 3 times daily.  She is not using her albuterol more than normal.  She also has a history of frequent pneumonia.  She smokes 1 cigarette a day, has a history of dementia.  No history of CHF, coronary disease, MI, diabetes.  PMD: None.  Daughter states that they moved down here recently.  Patient was admitted to the hospital for COPD exacerbation March 22.  All history obtained from daughter due to dementia.  Past Medical History:  Diagnosis Date   Emphysema lung (HCC)    Thyroid disease     Past Surgical History:  Procedure Laterality Date   TUBAL LIGATION      Family History  Problem Relation Age of Onset   Lung cancer Father    Diabetes Mellitus II Sister     Social History   Tobacco Use   Smoking status: Every Day     Packs/day: 0.25    Types: Cigarettes   Smokeless tobacco: Never  Substance Use Topics   Alcohol use: Never   Drug use: Never    No current facility-administered medications for this encounter.  Current Outpatient Medications:    amoxicillin-clavulanate (AUGMENTIN) 875-125 MG tablet, Take 1 tablet by mouth 2 (two) times daily. X 7 days, Disp: 14 tablet, Rfl: 0   fluticasone (FLONASE) 50 MCG/ACT nasal spray, Place 2 sprays into both nostrils daily., Disp: 16 g, Rfl: 0   levothyroxine (SYNTHROID) 88 MCG tablet, Take 88 mcg by mouth daily before breakfast. Daughter is unsure of dosage, Disp: , Rfl:    Spacer/Aero-Holding Chambers (AEROCHAMBER PLUS) inhaler, Use with inhaler, Disp: 1 each, Rfl: 2   albuterol (VENTOLIN HFA) 108 (90 Base) MCG/ACT inhaler, Inhale 2 puffs into the lungs every 4 (four) hours as needed for wheezing or shortness of breath., Disp: 18 g, Rfl: 0   budesonide-formoterol (SYMBICORT) 80-4.5 MCG/ACT inhaler, Inhale 2 puffs into the lungs in the morning and at bedtime., Disp: 1 each, Rfl: 12   omeprazole (PRILOSEC) 20 MG capsule, Take 20 mg by mouth daily., Disp: , Rfl:    sertraline (ZOLOFT) 50 MG tablet, Take 50 mg by mouth daily., Disp: , Rfl:   Allergies  Allergen Reactions   Naproxen Swelling   Sulfa Antibiotics Rash   Codeine  ROS  As noted in HPI.   Physical Exam  BP (!) 168/68 (BP Location: Right Arm)   Pulse 67   Temp 98.5 F (36.9 C) (Oral)   Resp (!) 24   Ht 5\' 4"  (1.626 m)   Wt 77.1 kg   SpO2 96%   BMI 29.18 kg/m   Constitutional: Well developed, well nourished, no acute distress. Eyes:  EOMI, conjunctiva normal bilaterally HENT: Normocephalic, atraumatic,mucus membranes moist.  Positive nasal congestion.  Positive maxillary sinus tenderness.  Tonsils surgically absent.  Normal oropharynx.  No postnasal drip. Respiratory: Normal inspiratory effort.  Slightly tachypneic.  No signs of respiratory fatigue.  Fair air movement.  Lungs clear  bilaterally. Cardiovascular: Normal rate, regular rhythm, no murmurs rubs or gallops GI: nondistended skin: No rash, skin intact Musculoskeletal: Calves symmetric, nontender, no edema Neurologic: Alert & oriented x 3, no focal neuro deficits Psychiatric: Speech and behavior appropriate   ED Course   Medications - No data to display  Orders Placed This Encounter  Procedures   SARS CORONAVIRUS 2 (TAT 6-24 HRS) Nasopharyngeal Nasopharyngeal Swab    Standing Status:   Standing    Number of Occurrences:   1    Order Specific Question:   Is this test for diagnosis or screening    Answer:   Diagnosis of ill patient    Order Specific Question:   Symptomatic for COVID-19 as defined by CDC    Answer:   Yes    Order Specific Question:   Date of Symptom Onset    Answer:   04/18/2021    Order Specific Question:   Hospitalized for COVID-19    Answer:   Yes    Order Specific Question:   Admitted to ICU for COVID-19    Answer:   No    Order Specific Question:   Previously tested for COVID-19    Answer:   Yes    Order Specific Question:   Resident in a congregate (group) care setting    Answer:   No    Order Specific Question:   Employed in healthcare setting    Answer:   No    Order Specific Question:   Pregnant    Answer:   No    Order Specific Question:   Has patient completed COVID vaccination(s) (2 doses of Pfizer/Moderna 1 dose of 04/20/2021)    Answer:   Yes    Order Specific Question:   Has patient completed COVID Booster / 3rd dose    Answer:   Unknown   DG Chest 2 View    Standing Status:   Standing    Number of Occurrences:   1    Order Specific Question:   Reason for Exam (SYMPTOM  OR DIAGNOSIS REQUIRED)    Answer:   cough wheeze sob r/o PNA, pulm edema, effusion   POC SARS Coronavirus 2 Ag    Standing Status:   Standing    Number of Occurrences:   1    Results for orders placed or performed during the hospital encounter of 04/20/21 (from the past 24 hour(s))  POC  SARS Coronavirus 2 Ag     Status: None   Collection Time: 04/20/21  2:01 PM  Result Value Ref Range   SARSCOV2ONAVIRUS 2 AG NEGATIVE NEGATIVE  SARS CORONAVIRUS 2 (TAT 6-24 HRS) Nasopharyngeal Nasopharyngeal Swab     Status: None   Collection Time: 04/20/21  3:31 PM   Specimen: Nasopharyngeal Swab  Result Value Ref  Range   SARS Coronavirus 2 NEGATIVE NEGATIVE   DG Chest 2 View  Result Date: 04/20/2021 CLINICAL DATA:  Cough and wheezing.  Symptoms for 1 week. EXAM: CHEST - 2 VIEW COMPARISON:  10/07/2020 and earlier FINDINGS: Lungs are mildly hyperinflated. There is perihilar peribronchial thickening. No focal consolidations or pleural effusions. No pulmonary edema. Remote vertebral augmentation. IMPRESSION: Bronchitic changes. No evidence for acute focal pulmonary abnormality. Electronically Signed   By: Norva Pavlov M.D.   On: 04/20/2021 15:27    ED Clinical Impression  1. COPD exacerbation (HCC)   2. Acute non-recurrent maxillary sinusitis   3. Encounter for laboratory testing for COVID-19 virus      ED Assessment/Plan  ER records reviewed.  As noted in HPI. Rapid COVID-negative.  COVID PCR sent.  However, patient will not be a candidate for antivirals due to duration of symptoms.  Patient with URI, concern for secondary pneumonia and COPD exacerbation..  Daughter states that the patient's tachypnea is not new or different.  Checking chest x-ray.  She also has maxillary sinus tenderness.  Will give daughter mouthpiece for the nebulizer machine.  Reviewed imaging independently and discussed with radiology.  Hyperinflation.  Mild bronchitic changes.  No evidence of pneumonia, effusion.  See radiology report for full details.  Patient appears stable, she is alert, answers questions appropriately, her vitals are normal, while she is tachypneic, daughter states that this is baseline for her.  Plan to send home with Augmentin which will treat a COPD exacerbation in addition to a  sinusitis, prednisone, saline nasal irrigation, Flonase, regularly scheduled albuterol for the next 4 days.  Will refill her albuterol inhaler with a spacer. Ordering assistance in finding a PMD.  will also give daughter primary care list.  Strict ER return precautions given.  COVID  negative.  Discussed labs, imaging, MDM, treatment plan, and plan for follow-up with family. Discussed sn/sx that should prompt return to the ED. family agrees with plan.   Meds ordered this encounter  Medications   albuterol (VENTOLIN HFA) 108 (90 Base) MCG/ACT inhaler    Sig: Inhale 2 puffs into the lungs every 4 (four) hours as needed for wheezing or shortness of breath.    Dispense:  18 g    Refill:  0   fluticasone (FLONASE) 50 MCG/ACT nasal spray    Sig: Place 2 sprays into both nostrils daily.    Dispense:  16 g    Refill:  0   Spacer/Aero-Holding Chambers (AEROCHAMBER PLUS) inhaler    Sig: Use with inhaler    Dispense:  1 each    Refill:  2    Please educate patient on use   amoxicillin-clavulanate (AUGMENTIN) 875-125 MG tablet    Sig: Take 1 tablet by mouth 2 (two) times daily. X 7 days    Dispense:  14 tablet    Refill:  0       *This clinic note was created using Scientist, clinical (histocompatibility and immunogenetics). Therefore, there may be occasional mistakes despite careful proofreading.  ?    Domenick Gong, MD 04/21/21 [phone]

## 2021-04-20 NOTE — ED Triage Notes (Addendum)
Pt with cough for a week and sometimes breathing fast. Using nebulizer at home. Productive of clear phlegm. No PCP here yet as she recently moved to the area. Pt states "I can't breathe good."

## 2021-04-21 LAB — SARS CORONAVIRUS 2 (TAT 6-24 HRS): SARS Coronavirus 2: NEGATIVE

## 2021-05-20 ENCOUNTER — Other Ambulatory Visit: Payer: Self-pay

## 2021-05-20 ENCOUNTER — Emergency Department
Admission: EM | Admit: 2021-05-20 | Discharge: 2021-05-20 | Disposition: A | Discharge status: Home / Self Care | Payer: Medicare HMO | Attending: Emergency Medicine | Admitting: Emergency Medicine

## 2021-05-20 DIAGNOSIS — R55 Syncope and collapse: Secondary | ICD-10-CM | POA: Insufficient documentation

## 2021-05-20 DIAGNOSIS — Z5321 Procedure and treatment not carried out due to patient leaving prior to being seen by health care provider: Secondary | ICD-10-CM | POA: Diagnosis not present

## 2021-05-20 LAB — CBC
HCT: 41.9 % (ref 36.0–46.0)
Hemoglobin: 14.8 g/dL (ref 12.0–15.0)
MCH: 30.5 pg (ref 26.0–34.0)
MCHC: 35.3 g/dL (ref 30.0–36.0)
MCV: 86.2 fL (ref 80.0–100.0)
Platelets: 273 10*3/uL (ref 150–400)
RBC: 4.86 MIL/uL (ref 3.87–5.11)
RDW: 14 % (ref 11.5–15.5)
WBC: 8.5 10*3/uL (ref 4.0–10.5)
nRBC: 0 % (ref 0.0–0.2)

## 2021-05-20 LAB — BASIC METABOLIC PANEL
Anion gap: 7 (ref 5–15)
BUN: 27 mg/dL — ABNORMAL HIGH (ref 8–23)
CO2: 24 mmol/L (ref 22–32)
Calcium: 9.4 mg/dL (ref 8.9–10.3)
Chloride: 108 mmol/L (ref 98–111)
Creatinine, Ser: 1.12 mg/dL — ABNORMAL HIGH (ref 0.44–1.00)
GFR, Estimated: 49 mL/min — ABNORMAL LOW (ref >60)
Glucose, Bld: 166 mg/dL — ABNORMAL HIGH (ref 70–99)
Potassium: 3.9 mmol/L (ref 3.5–5.1)
Sodium: 139 mmol/L (ref 135–145)

## 2021-05-20 NOTE — ED Triage Notes (Signed)
See first nurse note, pt reports she is here because she is very sick and feels like throwing up . States feels like passing out this am but denies syncopal episode. Nad noted

## 2021-05-20 NOTE — ED Triage Notes (Signed)
Arrives from home, per EMS report, patient had a syncopal episode this morning at home while sitting in a chair.    CBG:  268. VS wnl.  300 ml NS given.  PIV:  20 g LFA

## 2021-05-28 ENCOUNTER — Encounter: Payer: Self-pay | Admitting: Otolaryngology

## 2021-05-31 NOTE — Discharge Instructions (Signed)
[facility] DISCHARGE INSTRUCTIONS FOR MYRINGOTOMY AND TUBE INSERTION  [facility] PAUL JUENGEL, M.D.  Diet:   After surgery, the patient should take only liquids and foods as tolerated.  The patient may then have a regular diet after the effects of anesthesia have worn off, usually about four to six hours after surgery.  Activities:   The patient should rest until the effects of anesthesia have worn off.  After this, there are no restrictions on the normal daily activities.  Medications:   You will be given antibiotic drops to be used in the ears postoperatively.  It is recommended to use 3 drops 3 times a day for 3 days, then the drops should be saved for possible future use.  The tubes should not cause any discomfort to the patient, but if there is any question, Tylenol should be given according to the instructions for the age of the patient.  Other medications should be continued normally.  Precautions:   Should there be recurrent drainage after the tubes are placed, the drops should be used for approximately 3-4 days.  If it does not clear, you should call the ENT office.  Earplugs:   Earplugs are only needed for those who are going to be submerged under water.  When taking a bath or shower and using a cup or showerhead to rinse hair, it is not necessary to wear earplugs.  These come in a variety of fashions, all of which can be obtained at our office.  However, if one is not able to come by the office, then silicone plugs can be found at most pharmacies.  It is not advised to stick anything in the ear that is not approved as an earplug.  Silly putty is not to be used as an earplug.  Swimming is allowed in patients after ear tubes are inserted, however, they must wear earplugs if they are going to be submerged under water.  For those children who are going to be swimming a lot, it is recommended to use a fitted ear mold, which can be made by our audiologist.   If discharge is noticed from the ears, this most likely represents an ear infection.  We would recommend getting your eardrops and using them as indicated above.  If it does not clear, then you should call the ENT office.  For follow up, the patient should return to the ENT office three weeks postoperatively and then every six months as required by the doctor.  

## 2021-06-06 ENCOUNTER — Ambulatory Visit
Admission: RE | Admit: 2021-06-06 | Discharge: 2021-06-06 | Disposition: A | Discharge status: Home / Self Care | Payer: Medicare HMO | Attending: Otolaryngology | Admitting: Otolaryngology

## 2021-06-06 ENCOUNTER — Ambulatory Visit: Payer: Medicare HMO | Admitting: Anesthesiology

## 2021-06-06 ENCOUNTER — Encounter
Admission: RE | Disposition: A | Discharge status: Home / Self Care | Payer: Self-pay | Source: Home / Self Care | Attending: Otolaryngology

## 2021-06-06 ENCOUNTER — Encounter: Payer: Self-pay | Admitting: Otolaryngology

## 2021-06-06 ENCOUNTER — Other Ambulatory Visit: Payer: Self-pay

## 2021-06-06 DIAGNOSIS — E039 Hypothyroidism, unspecified: Secondary | ICD-10-CM | POA: Diagnosis not present

## 2021-06-06 DIAGNOSIS — K219 Gastro-esophageal reflux disease without esophagitis: Secondary | ICD-10-CM | POA: Insufficient documentation

## 2021-06-06 DIAGNOSIS — J449 Chronic obstructive pulmonary disease, unspecified: Secondary | ICD-10-CM | POA: Diagnosis not present

## 2021-06-06 DIAGNOSIS — H6992 Unspecified Eustachian tube disorder, left ear: Secondary | ICD-10-CM | POA: Diagnosis present

## 2021-06-06 DIAGNOSIS — Z6831 Body mass index (BMI) 31.0-31.9, adult: Secondary | ICD-10-CM | POA: Diagnosis not present

## 2021-06-06 DIAGNOSIS — F419 Anxiety disorder, unspecified: Secondary | ICD-10-CM | POA: Diagnosis not present

## 2021-06-06 DIAGNOSIS — I10 Essential (primary) hypertension: Secondary | ICD-10-CM | POA: Insufficient documentation

## 2021-06-06 DIAGNOSIS — I499 Cardiac arrhythmia, unspecified: Secondary | ICD-10-CM | POA: Diagnosis not present

## 2021-06-06 DIAGNOSIS — E669 Obesity, unspecified: Secondary | ICD-10-CM | POA: Insufficient documentation

## 2021-06-06 DIAGNOSIS — H6522 Chronic serous otitis media, left ear: Secondary | ICD-10-CM | POA: Diagnosis not present

## 2021-06-06 DIAGNOSIS — F0390 Unspecified dementia without behavioral disturbance: Secondary | ICD-10-CM | POA: Diagnosis not present

## 2021-06-06 DIAGNOSIS — Z87891 Personal history of nicotine dependence: Secondary | ICD-10-CM | POA: Diagnosis not present

## 2021-06-06 DIAGNOSIS — H9192 Unspecified hearing loss, left ear: Secondary | ICD-10-CM | POA: Diagnosis not present

## 2021-06-06 DIAGNOSIS — R0600 Dyspnea, unspecified: Secondary | ICD-10-CM | POA: Diagnosis not present

## 2021-06-06 HISTORY — DX: Hypothyroidism, unspecified: E03.9

## 2021-06-06 HISTORY — DX: Family history of other specified conditions: Z84.89

## 2021-06-06 HISTORY — PX: MYRINGOTOMY WITH TUBE PLACEMENT: SHX5663

## 2021-06-06 HISTORY — DX: Gastro-esophageal reflux disease without esophagitis: K21.9

## 2021-06-06 SURGERY — MYRINGOTOMY WITH TUBE PLACEMENT
Anesthesia: General | Site: Ear | Laterality: Left

## 2021-06-06 MED ORDER — ONDANSETRON HCL 4 MG/2ML IJ SOLN
INTRAMUSCULAR | Status: DC | PRN
  Administered 2021-06-06: 4 mg via INTRAVENOUS

## 2021-06-06 MED ORDER — PROPOFOL 10 MG/ML IV BOLUS
INTRAVENOUS | Status: DC | PRN
  Administered 2021-06-06: 20 mg via INTRAVENOUS
  Administered 2021-06-06: 60 mg via INTRAVENOUS

## 2021-06-06 MED ORDER — LACTATED RINGERS IV SOLN: INTRAVENOUS | Status: DC

## 2021-06-06 MED ORDER — LIDOCAINE HCL (CARDIAC) PF 100 MG/5ML IV SOSY
PREFILLED_SYRINGE | INTRAVENOUS | Status: DC | PRN
  Administered 2021-06-06: 50 mg via INTRAVENOUS

## 2021-06-06 MED ORDER — MIDAZOLAM HCL 5 MG/5ML IJ SOLN
INTRAMUSCULAR | Status: DC | PRN
  Administered 2021-06-06: .5 mg via INTRAVENOUS

## 2021-06-06 MED ORDER — GLYCOPYRROLATE 0.2 MG/ML IJ SOLN
INTRAMUSCULAR | Status: DC | PRN
  Administered 2021-06-06: .1 mg via INTRAVENOUS

## 2021-06-06 MED ORDER — CIPROFLOXACIN-DEXAMETHASONE 0.3-0.1 % OT SUSP
OTIC | Status: DC | PRN
  Administered 2021-06-06: 4 [drp] via OTIC

## 2021-06-06 SURGICAL SUPPLY — 10 items
BALL CTTN LRG ABS STRL LF (GAUZE/BANDAGES/DRESSINGS) ×1
BLADE MYR LANCE NRW W/HDL (BLADE) ×2 IMPLANT
CANISTER SUCT 1200ML W/VALVE (MISCELLANEOUS) ×2 IMPLANT
COTTONBALL LRG STERILE PKG (GAUZE/BANDAGES/DRESSINGS) ×2 IMPLANT
GLOVE SURG GAMMEX PI TX LF 7.5 (GLOVE) ×2 IMPLANT
STRAP BODY AND KNEE 60X3 (MISCELLANEOUS) ×2 IMPLANT
TOWEL OR 17X26 4PK STRL BLUE (TOWEL DISPOSABLE) ×2 IMPLANT
TUBE EAR T 1.27X5.3 BFLY (OTOLOGIC RELATED) ×1 IMPLANT
TUBING CONN 6MMX3.1M (TUBING) ×1
TUBING SUCTION CONN 0.25 STRL (TUBING) ×1 IMPLANT

## 2021-06-06 NOTE — Anesthesia Postprocedure Evaluation (Signed)
Anesthesia Post Note  Patient: Jackie Garcia  Procedure(s) Performed: LEFT MYRINGOTOMY WITH BUTTERFLY TUBE PLACEMENT (Left: Ear)     Patient location during evaluation: PACU Anesthesia Type: General Level of consciousness: awake and alert Pain management: pain level controlled Vital Signs Assessment: post-procedure vital signs reviewed and stable Respiratory status: spontaneous breathing, nonlabored ventilation, respiratory function stable and patient connected to nasal cannula oxygen Cardiovascular status: blood pressure returned to baseline and stable Postop Assessment: no apparent nausea or vomiting Anesthetic complications: no   No notable events documented.  Raveen Wieseler A  [name]

## 2021-06-06 NOTE — Anesthesia Procedure Notes (Signed)
Procedure Name: General with mask airway Date/Time: 06/06/2021 11:46 AM Performed by: Jimmy Picket, CRNA Pre-anesthesia Checklist: Patient identified, Patient being monitored, Emergency Drugs available, Timeout performed and Suction available Patient Re-evaluated:Patient Re-evaluated prior to induction Oxygen Delivery Method: Circle system utilized Preoxygenation: Pre-oxygenation with 100% oxygen Induction Type: Combination inhalational/ intravenous induction Ventilation: Mask ventilation without difficulty Dental Injury: Teeth and Oropharynx as per pre-operative assessment

## 2021-06-06 NOTE — H&P (Signed)
H&P has been reviewed and patient reevaluated, no changes necessary. To be downloaded later.  

## 2021-06-06 NOTE — Anesthesia Preprocedure Evaluation (Addendum)
Anesthesia Evaluation  Patient identified by MRN, date of birth, ID band Patient awake    Reviewed: Allergy & Precautions, NPO status , Patient's Chart, lab work & pertinent test results, reviewed documented beta blocker date and time   History of Anesthesia Complications Negative for: history of anesthetic complications  Airway Mallampati: III  TM Distance: >3 FB Neck ROM: Limited    Dental  (+) Dental Advisory Given, Poor Dentition, Chipped, Missing   Pulmonary COPD, Current Smoker and Patient abstained from smoking.,    breath sounds clear to auscultation       Cardiovascular Exercise Tolerance: Poor hypertension, Pt. on medications and Pt. on home beta blockers (-) angina+ DOE (Chronic, stable)  + dysrhythmias  Rhythm:Regular Rate:Normal     Neuro/Psych Anxiety Dementia    GI/Hepatic GERD  Medicated and Controlled,  Endo/Other  Hypothyroidism   Renal/GU      Musculoskeletal   Abdominal (+) + obese (BMI 31),   Peds  Hematology   Anesthesia Other Findings   Reproductive/Obstetrics                           Anesthesia Physical Anesthesia Plan  ASA: 3  Anesthesia Plan: General   Post-op Pain Management:    Induction: Intravenous  PONV Risk Score and Plan: 2 and Propofol infusion, TIVA and Treatment may vary due to age or medical condition  Airway Management Planned: Natural Airway and Nasal Cannula  Additional Equipment:   Intra-op Plan:   Post-operative Plan:   Informed Consent: I have reviewed the patients History and Physical, chart, labs and discussed the procedure including the risks, benefits and alternatives for the proposed anesthesia with the patient or authorized representative who has indicated his/her understanding and acceptance.       Plan Discussed with: CRNA and Anesthesiologist  Anesthesia Plan Comments:        Anesthesia Quick Evaluation

## 2021-06-06 NOTE — Transfer of Care (Signed)
Immediate Anesthesia Transfer of Care Note  Patient: Jackie Garcia  Procedure(s) Performed: LEFT MYRINGOTOMY WITH BUTTERFLY TUBE PLACEMENT (Left: Ear)  Patient Location: PACU  Anesthesia Type: General  Level of Consciousness: awake, alert  and patient cooperative  Airway and Oxygen Therapy: Patient Spontanous Breathing and Patient connected to supplemental oxygen  Post-op Assessment: Post-op Vital signs reviewed, Patient's Cardiovascular Status Stable, Respiratory Function Stable, Patent Airway and No signs of Nausea or vomiting  Post-op Vital Signs: Reviewed and stable  Complications: No notable events documented.

## 2021-06-06 NOTE — Op Note (Signed)
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  This was all suctioned clear.  A butterfly tube was placed in the anterior-inferior quadrant  Procedure: With the patient in a comfortable supine position, general mask anesthesia was administered.  At an appropriate level, microscope and speculum were used to examine and clean the left ear canal.  The findings were as described above.  An anterior inferior radial myringotomy incision was sharply executed.  Middle ear contents were suctioned clear.  A butterfly tube was placed without difficulty.  Ciprodex otic solution was instilled into the external canal, and insufflated into the middle ear.  A cotton ball was placed at the external meatus. Hemostasis was observed.  This side was completed.  The patient tolerated this well.  There were no operative complications  Following this  The patient was returned to anesthesia, awakened, and transferred to recovery in stable condition.  Dispo:  PACU to home  Plan: Routine drop use and water precautions.  Recheck my office three weeks with an audiogram.   Cammy Copa 11:53 AM 06/06/2021

## 2021-06-07 ENCOUNTER — Encounter: Payer: Self-pay | Admitting: Otolaryngology

## 2021-11-06 ENCOUNTER — Emergency Department: Payer: Medicare HMO

## 2021-11-06 ENCOUNTER — Encounter: Payer: Self-pay | Admitting: Emergency Medicine

## 2021-11-06 ENCOUNTER — Other Ambulatory Visit: Payer: Self-pay

## 2021-11-06 ENCOUNTER — Inpatient Hospital Stay
Admission: EM | Admit: 2021-11-06 | Discharge: 2021-11-19 | DRG: 190 | Disposition: A | Discharge status: Home Health | Payer: Medicare HMO | Attending: Obstetrics and Gynecology | Admitting: Obstetrics and Gynecology

## 2021-11-06 DIAGNOSIS — F0393 Unspecified dementia, unspecified severity, with mood disturbance: Secondary | ICD-10-CM | POA: Diagnosis present

## 2021-11-06 DIAGNOSIS — J439 Emphysema, unspecified: Secondary | ICD-10-CM | POA: Diagnosis not present

## 2021-11-06 DIAGNOSIS — J449 Chronic obstructive pulmonary disease, unspecified: Secondary | ICD-10-CM | POA: Diagnosis present

## 2021-11-06 DIAGNOSIS — F320 Major depressive disorder, single episode, mild: Secondary | ICD-10-CM

## 2021-11-06 DIAGNOSIS — I10 Essential (primary) hypertension: Secondary | ICD-10-CM | POA: Diagnosis present

## 2021-11-06 DIAGNOSIS — Z79899 Other long term (current) drug therapy: Secondary | ICD-10-CM | POA: Diagnosis not present

## 2021-11-06 DIAGNOSIS — F05 Delirium due to known physiological condition: Secondary | ICD-10-CM | POA: Diagnosis present

## 2021-11-06 DIAGNOSIS — E039 Hypothyroidism, unspecified: Secondary | ICD-10-CM | POA: Diagnosis present

## 2021-11-06 DIAGNOSIS — K219 Gastro-esophageal reflux disease without esophagitis: Secondary | ICD-10-CM | POA: Diagnosis present

## 2021-11-06 DIAGNOSIS — F17210 Nicotine dependence, cigarettes, uncomplicated: Secondary | ICD-10-CM | POA: Diagnosis present

## 2021-11-06 DIAGNOSIS — G9340 Encephalopathy, unspecified: Secondary | ICD-10-CM | POA: Diagnosis present

## 2021-11-06 DIAGNOSIS — J441 Chronic obstructive pulmonary disease with (acute) exacerbation: Secondary | ICD-10-CM | POA: Diagnosis present

## 2021-11-06 DIAGNOSIS — F0394 Unspecified dementia, unspecified severity, with anxiety: Secondary | ICD-10-CM | POA: Diagnosis present

## 2021-11-06 DIAGNOSIS — Z882 Allergy status to sulfonamides status: Secondary | ICD-10-CM

## 2021-11-06 DIAGNOSIS — Z79890 Hormone replacement therapy (postmenopausal): Secondary | ICD-10-CM | POA: Diagnosis not present

## 2021-11-06 DIAGNOSIS — Z20822 Contact with and (suspected) exposure to covid-19: Secondary | ICD-10-CM | POA: Diagnosis present

## 2021-11-06 DIAGNOSIS — J189 Pneumonia, unspecified organism: Secondary | ICD-10-CM | POA: Diagnosis present

## 2021-11-06 DIAGNOSIS — Z888 Allergy status to other drugs, medicaments and biological substances status: Secondary | ICD-10-CM | POA: Diagnosis not present

## 2021-11-06 DIAGNOSIS — Z9851 Tubal ligation status: Secondary | ICD-10-CM | POA: Diagnosis not present

## 2021-11-06 DIAGNOSIS — E785 Hyperlipidemia, unspecified: Secondary | ICD-10-CM | POA: Diagnosis present

## 2021-11-06 DIAGNOSIS — Z7951 Long term (current) use of inhaled steroids: Secondary | ICD-10-CM

## 2021-11-06 DIAGNOSIS — Z885 Allergy status to narcotic agent status: Secondary | ICD-10-CM | POA: Diagnosis not present

## 2021-11-06 DIAGNOSIS — Z720 Tobacco use: Secondary | ICD-10-CM | POA: Diagnosis present

## 2021-11-06 DIAGNOSIS — F0392 Unspecified dementia, unspecified severity, with psychotic disturbance: Secondary | ICD-10-CM | POA: Diagnosis present

## 2021-11-06 DIAGNOSIS — R45851 Suicidal ideations: Secondary | ICD-10-CM | POA: Diagnosis not present

## 2021-11-06 DIAGNOSIS — J849 Interstitial pulmonary disease, unspecified: Secondary | ICD-10-CM

## 2021-11-06 DIAGNOSIS — F32A Depression, unspecified: Secondary | ICD-10-CM

## 2021-11-06 LAB — COMPREHENSIVE METABOLIC PANEL
ALT: 22 U/L (ref 0–44)
AST: 30 U/L (ref 15–41)
Albumin: 3.9 g/dL (ref 3.5–5.0)
Alkaline Phosphatase: 72 U/L (ref 38–126)
Anion gap: 12 (ref 5–15)
BUN: 21 mg/dL (ref 8–23)
CO2: 25 mmol/L (ref 22–32)
Calcium: 9.6 mg/dL (ref 8.9–10.3)
Chloride: 106 mmol/L (ref 98–111)
Creatinine, Ser: 0.78 mg/dL (ref 0.44–1.00)
GFR, Estimated: >60 mL/min (ref >60)
Glucose, Bld: 107 mg/dL — ABNORMAL HIGH (ref 70–99)
Potassium: 3.5 mmol/L (ref 3.5–5.1)
Sodium: 143 mmol/L (ref 135–145)
Total Bilirubin: 0.4 mg/dL (ref 0.3–1.2)
Total Protein: 7.7 g/dL (ref 6.5–8.1)

## 2021-11-06 LAB — CBC
HCT: 50 % — ABNORMAL HIGH (ref 36.0–46.0)
Hemoglobin: 16.1 g/dL — ABNORMAL HIGH (ref 12.0–15.0)
MCH: 28.1 pg (ref 26.0–34.0)
MCHC: 32.2 g/dL (ref 30.0–36.0)
MCV: 87.4 fL (ref 80.0–100.0)
Platelets: 291 10*3/uL (ref 150–400)
RBC: 5.72 MIL/uL — ABNORMAL HIGH (ref 3.87–5.11)
RDW: 14.7 % (ref 11.5–15.5)
WBC: 11 10*3/uL — ABNORMAL HIGH (ref 4.0–10.5)
nRBC: 0 % (ref 0.0–0.2)

## 2021-11-06 LAB — RESP PANEL BY RT-PCR (FLU A&B, COVID) ARPGX2
Influenza A by PCR: NEGATIVE
Influenza B by PCR: NEGATIVE
SARS Coronavirus 2 by RT PCR: NEGATIVE

## 2021-11-06 LAB — ACETAMINOPHEN LEVEL: Acetaminophen (Tylenol), Serum: <10 ug/mL — ABNORMAL LOW (ref 10–30)

## 2021-11-06 LAB — BRAIN NATRIURETIC PEPTIDE: B Natriuretic Peptide: 61.9 pg/mL (ref 0.0–100.0)

## 2021-11-06 LAB — SALICYLATE LEVEL: Salicylate Lvl: <7 mg/dL — ABNORMAL LOW (ref 7.0–30.0)

## 2021-11-06 LAB — ETHANOL: Alcohol, Ethyl (B): <10 mg/dL (ref <10)

## 2021-11-06 MED ORDER — SODIUM CHLORIDE 0.9 % IV SOLN
2.0000 g | Freq: Once | INTRAVENOUS | Status: DC
  Filled 2021-11-06: qty 20

## 2021-11-06 MED ORDER — ALBUTEROL SULFATE HFA 108 (90 BASE) MCG/ACT IN AERS: 2.0000 | INHALATION_SPRAY | RESPIRATORY_TRACT | Status: DC | PRN

## 2021-11-06 MED ORDER — POLYETHYLENE GLYCOL 3350 17 G PO PACK
17.0000 g | PACK | Freq: Every day | ORAL | Status: DC
  Administered 2021-11-07 – 2021-11-18 (×7): 17 g via ORAL
  Filled 2021-11-06 (×12): qty 1

## 2021-11-06 MED ORDER — SODIUM CHLORIDE 0.9 % IV SOLN
2.0000 g | INTRAVENOUS | Status: AC
  Administered 2021-11-07 – 2021-11-09 (×3): 2 g via INTRAVENOUS
  Filled 2021-11-06 (×3): qty 20

## 2021-11-06 MED ORDER — ROSUVASTATIN CALCIUM 10 MG PO TABS
5.0000 mg | ORAL_TABLET | Freq: Every day | ORAL | Status: DC
  Administered 2021-11-07 – 2021-11-19 (×13): 5 mg via ORAL
  Filled 2021-11-06 (×13): qty 1

## 2021-11-06 MED ORDER — SERTRALINE HCL 50 MG PO TABS
50.0000 mg | ORAL_TABLET | Freq: Every day | ORAL | Status: DC
  Administered 2021-11-06: 50 mg via ORAL
  Filled 2021-11-06: qty 1

## 2021-11-06 MED ORDER — LEVOTHYROXINE SODIUM 50 MCG PO TABS: 50.0000 ug | ORAL_TABLET | Freq: Every day | ORAL | Status: DC

## 2021-11-06 MED ORDER — POTASSIUM CHLORIDE IN NACL 20-0.9 MEQ/L-% IV SOLN
INTRAVENOUS | Status: DC
  Filled 2021-11-06 (×2): qty 1000

## 2021-11-06 MED ORDER — ENOXAPARIN SODIUM 60 MG/0.6ML IJ SOSY: 0.5000 mg/kg | PREFILLED_SYRINGE | INTRAMUSCULAR | Status: DC

## 2021-11-06 MED ORDER — LEVOTHYROXINE SODIUM 50 MCG PO TABS
50.0000 ug | ORAL_TABLET | Freq: Every day | ORAL | Status: DC
  Administered 2021-11-07 – 2021-11-19 (×13): 50 ug via ORAL
  Filled 2021-11-06 (×13): qty 1

## 2021-11-06 MED ORDER — PANTOPRAZOLE SODIUM 40 MG PO TBEC
40.0000 mg | DELAYED_RELEASE_TABLET | Freq: Every day | ORAL | Status: DC
  Administered 2021-11-06 – 2021-11-19 (×14): 40 mg via ORAL
  Filled 2021-11-06 (×14): qty 1

## 2021-11-06 MED ORDER — TRAZODONE HCL 50 MG PO TABS: 50.0000 mg | ORAL_TABLET | Freq: Every evening | ORAL | Status: DC | PRN

## 2021-11-06 MED ORDER — PREDNISONE 20 MG PO TABS
40.0000 mg | ORAL_TABLET | Freq: Every day | ORAL | Status: DC
  Administered 2021-11-07 – 2021-11-10 (×4): 40 mg via ORAL
  Filled 2021-11-06 (×4): qty 2

## 2021-11-06 MED ORDER — LORAZEPAM 2 MG/ML IJ SOLN: 2.0000 mg | Freq: Four times a day (QID) | INTRAMUSCULAR | Status: DC | PRN

## 2021-11-06 MED ORDER — IPRATROPIUM-ALBUTEROL 0.5-2.5 (3) MG/3ML IN SOLN
3.0000 mL | Freq: Once | RESPIRATORY_TRACT | Status: AC
  Administered 2021-11-06: 3 mL via RESPIRATORY_TRACT
  Filled 2021-11-06: qty 3

## 2021-11-06 MED ORDER — ACETAMINOPHEN 325 MG PO TABS
650.0000 mg | ORAL_TABLET | Freq: Four times a day (QID) | ORAL | Status: DC | PRN
  Administered 2021-11-15: 650 mg via ORAL
  Filled 2021-11-06 (×2): qty 2

## 2021-11-06 MED ORDER — TRAZODONE HCL 100 MG PO TABS
100.0000 mg | ORAL_TABLET | Freq: Every evening | ORAL | Status: DC | PRN
  Administered 2021-11-07 – 2021-11-17 (×8): 100 mg via ORAL
  Filled 2021-11-06 (×9): qty 1

## 2021-11-06 MED ORDER — LORAZEPAM 2 MG/ML IJ SOLN
1.0000 mg | Freq: Four times a day (QID) | INTRAMUSCULAR | Status: DC | PRN
  Administered 2021-11-07: 2 mg via INTRAMUSCULAR
  Filled 2021-11-06: qty 1

## 2021-11-06 MED ORDER — ONDANSETRON HCL 4 MG/2ML IJ SOLN: 4.0000 mg | Freq: Four times a day (QID) | INTRAMUSCULAR | Status: DC | PRN

## 2021-11-06 MED ORDER — ACETAMINOPHEN 650 MG RE SUPP: 650.0000 mg | Freq: Four times a day (QID) | RECTAL | Status: DC | PRN

## 2021-11-06 MED ORDER — HALOPERIDOL LACTATE 5 MG/ML IJ SOLN
2.0000 mg | Freq: Four times a day (QID) | INTRAMUSCULAR | Status: DC | PRN
  Administered 2021-11-06: 2 mg via INTRAMUSCULAR
  Filled 2021-11-06: qty 1

## 2021-11-06 MED ORDER — IPRATROPIUM-ALBUTEROL 0.5-2.5 (3) MG/3ML IN SOLN: 3.0000 mL | RESPIRATORY_TRACT | Status: DC | PRN

## 2021-11-06 MED ORDER — ONDANSETRON HCL 4 MG PO TABS: 4.0000 mg | ORAL_TABLET | Freq: Four times a day (QID) | ORAL | Status: DC | PRN

## 2021-11-06 MED ORDER — SODIUM CHLORIDE 0.9 % IV SOLN
500.0000 mg | Freq: Once | INTRAVENOUS | Status: DC
  Filled 2021-11-06: qty 5

## 2021-11-06 MED ORDER — SODIUM CHLORIDE 0.9 % IV SOLN
500.0000 mg | INTRAVENOUS | Status: DC
  Administered 2021-11-07 – 2021-11-09 (×3): 500 mg via INTRAVENOUS
  Filled 2021-11-06 (×2): qty 5

## 2021-11-06 MED ORDER — MAGNESIUM HYDROXIDE 400 MG/5ML PO SUSP: 30.0000 mL | Freq: Every day | ORAL | Status: DC | PRN

## 2021-11-06 MED ORDER — IPRATROPIUM-ALBUTEROL 0.5-2.5 (3) MG/3ML IN SOLN
3.0000 mL | Freq: Four times a day (QID) | RESPIRATORY_TRACT | Status: DC
  Administered 2021-11-07 – 2021-11-08 (×2): 3 mL via RESPIRATORY_TRACT
  Filled 2021-11-06 (×3): qty 3

## 2021-11-06 MED ORDER — SERTRALINE HCL 50 MG PO TABS
100.0000 mg | ORAL_TABLET | Freq: Every morning | ORAL | Status: DC
  Administered 2021-11-07 – 2021-11-19 (×13): 100 mg via ORAL
  Filled 2021-11-06 (×13): qty 2

## 2021-11-06 MED ORDER — PREDNISONE 20 MG PO TABS
40.0000 mg | ORAL_TABLET | Freq: Once | ORAL | Status: AC
  Administered 2021-11-06: 40 mg via ORAL
  Filled 2021-11-06: qty 2

## 2021-11-06 MED ORDER — MOMETASONE FURO-FORMOTEROL FUM 100-5 MCG/ACT IN AERO
2.0000 | INHALATION_SPRAY | Freq: Two times a day (BID) | RESPIRATORY_TRACT | Status: DC
  Administered 2021-11-06 – 2021-11-19 (×21): 2 via RESPIRATORY_TRACT
  Filled 2021-11-06 (×3): qty 8.8

## 2021-11-06 MED ORDER — LORAZEPAM 2 MG/ML IJ SOLN
0.5000 mg | INTRAMUSCULAR | Status: DC | PRN
  Administered 2021-11-08 – 2021-11-09 (×2): 0.5 mg via INTRAVENOUS
  Filled 2021-11-06 (×2): qty 1

## 2021-11-06 MED ORDER — SENNA 8.6 MG PO TABS
2.0000 | ORAL_TABLET | Freq: Every day | ORAL | Status: DC
  Administered 2021-11-07 – 2021-11-18 (×11): 17.2 mg via ORAL
  Filled 2021-11-06 (×11): qty 2

## 2021-11-06 MED ORDER — ADULT MULTIVITAMIN W/MINERALS CH
ORAL_TABLET | Freq: Every day | ORAL | Status: DC
  Administered 2021-11-07 – 2021-11-18 (×12): 1 via ORAL
  Filled 2021-11-06 (×13): qty 1

## 2021-11-06 MED ORDER — AMLODIPINE BESYLATE 10 MG PO TABS
10.0000 mg | ORAL_TABLET | Freq: Every day | ORAL | Status: DC
  Administered 2021-11-07 – 2021-11-19 (×13): 10 mg via ORAL
  Filled 2021-11-06 (×6): qty 1
  Filled 2021-11-06: qty 2
  Filled 2021-11-06 (×6): qty 1

## 2021-11-06 NOTE — Progress Notes (Signed)
PHARMACIST - PHYSICIAN COMMUNICATION ? ?CONCERNING:  Enoxaparin (Lovenox) for DVT Prophylaxis  ? ? ?RECOMMENDATION: ?Patient was prescribed enoxaprin 40mg  q24 hours for VTE prophylaxis.  ? ?Filed Weights  ? 11/06/21 1452  ?Weight: 88 kg (194 lb 0.1 oz)  ? ? ?Body mass index is 31.31 kg/m?. ? ?Estimated Creatinine Clearance: 61.6 mL/min (by C-G formula based on SCr of 0.78 mg/dL). ? ? ?Based on [place] patient is candidate for enoxaparin 0.5mg /kg TBW SQ every 24 hours based on BMI being >30. ? ?DESCRIPTION: ?Pharmacy has adjusted enoxaparin dose per [facility] policy. ? ?Patient is now receiving enoxaparin 0.5 mg/kg every 24 hours  ? ?Renda Rolls, PharmD, MBA ?11/06/2021 ?11:24 PM ? ?

## 2021-11-06 NOTE — BH Assessment (Signed)
Comprehensive Clinical Assessment (CCA) Note ? ?11/06/2021 ?Jackie HolmesMarcia K Garcia ?[id number] ?Recommendations for Services/Supports/Treatments: Consulted with Jackie BrilliantJackie T., NP, who determined pt. meets inpatient psychiatric criteria when medically cleared. Notified Dr. Erma HeritageIsaacs and [place], RN of disposition recommendation.  ? ?Jackie HallsMarcia K. Orson Garcia is an 81 year old, English speaking, white female with a PMH hx of depression. Pt also has a dementia dx. Pt presented to [facility] ED due to voicing a plan to slit her throat with a knife. On assessment, pt. admits to making suicidal statements to her daughter. Pt explained that her daughter treated her like crap by couldn't the heat on in her room to a point that she couldn't breathe. Pt denied being depressed in the last 2 weeks, explaining that this was the first day she'd had thoughts of SI. Pt explained that she decided not to act on the urge to slit her neck. Pt had poor insight and was sarcastic throughout the interview. Pt was cooperative, but irritable. Pt had clear speech and was oriented x4. Pt had an irritable mood and a tense affect. Pt denied current SI/HI/AV/H. ? ?Chief Complaint:  ?Chief Complaint  ?Patient presents with  ? Psychiatric Evaluation  ? ?Visit Diagnosis: Dementia  ? ? ?CCA Screening, Triage and Referral (STR) ? ?Patient Reported Information ?How did you hear about us? Other (Comment) Mudlogger(Law enforcement) ? ?Referral name: No data recorded ?Referral phone number: No data recorded ? ?Whom do you see for routine medical problems? No data recorded ?Practice/Facility Name: No data recorded ?Practice/Facility Phone Number: No data recorded ?Name of Contact: No data recorded ?Contact Number: No data recorded ?Contact Fax Number: No data recorded ?Prescriber Name: No data recorded ?Prescriber Address (if known): No data recorded ? ?What Is the Reason for Your Visit/Call Today? [place] the [place] [employer] [place] for psychiatric evaluation. Pt was IVC'ed by her  daughter who states the patient has a h/o dementia, COPD, and depression.  Pt has been refusing to go to MD appts and then she made a statement to her daughter saying "Im looking for a knife to slip my throat because I'm bored". ? ?How Long Has This Been Causing You Problems? <Week ? ?What Do You Feel Would Help You the Most Today? Stress Management ? ? ?Have You Recently Been in Any Inpatient Treatment (Hospital/Detox/Crisis Center/28-Day Program)? No data recorded ?Name/Location of Program/Hospital:No data recorded ?How Long Were You There? No data recorded ?When Were You Discharged? No data recorded ? ?Have You [facility] Received Services From [facility] Before? No data recorded ?[facility]? No data recorded ? ?Have You Recently Had Any Thoughts About Hurting Yourself? Yes ? ?Are You Planning to Commit Suicide/Harm Yourself At This time? No ? ? ?Have you Recently Had Thoughts About Hurting Someone Jackie Ohslse? No ? ?Explanation: No data recorded ? ?Have You Used Any Alcohol or Drugs in the Past 24 Hours? No ? ?How Long Ago Did You Use Drugs or Alcohol? No data recorded ?What Did You Use and How Much? No data recorded ? ?Do You Currently Have a Therapist/Psychiatrist? No ? ?Name of Therapist/Psychiatrist: No data recorded ? ?Have You Been Recently Discharged From Any Office Practice or Programs? No ? ?Explanation of Discharge From Practice/Program: No data recorded ? ?  ?CCA Screening Triage Referral Assessment ?Type of Contact: Face-to-Face ? ?Is this Initial or Reassessment? No data recorded ?Date Telepsych consult ordered in [facility]:  No data recorded ?Time Telepsych consult ordered in [facility]:  No data  recorded ? ?Patient Reported Information Reviewed? No data recorded ?Patient Left Without Being Seen? No data recorded ?Reason for Not Completing Assessment: No data recorded ? ?Collateral Involvement: None provided ? ? ?Does Patient Have a Automotive engineer Guardian? No data recorded ?Name and Contact of  Legal Guardian: No data recorded ?If Minor and Not Living with Parent(s), Who has Custody? n/a ? ?Is CPS involved or ever been involved? Never ? ?Is APS involved or ever been involved? Never ? ? ?Patient Determined To Be At Risk for Harm To Self or Others Based on Review of Patient Reported Information or Presenting Complaint? No ? ?Method: No data recorded ?Availability of Means: No data recorded ?Intent: No data recorded ?Notification Required: No data recorded ?Additional Information for Danger to Others Potential: No data recorded ?Additional Comments for Danger to Others Potential: No data recorded ?Are There Guns or Other Weapons in Your Home? No data recorded ?Types of Guns/Weapons: No data recorded ?Are These Weapons Safely Secured?                            No data recorded ?Who Could Verify You Are Able To Have These Secured: No data recorded ?Do You Have any Outstanding Charges, Pending Court Dates, Parole/Probation? No data recorded ?Contacted To Inform of Risk of Harm To Self or Others: No data recorded ? ?Location of Assessment: [facility] ED ? ? ?Does Patient Present under Involuntary Commitment? Yes ? ?IVC Papers Initial File Date: 11/06/21 ? ? ?[place] of Residence: [place] ? ? ?Patient Currently Receiving the Following Services: Not Receiving Services ? ? ?Determination of Need: Emergent (2 hours) ? ? ?Options For Referral: ED Visit ? ? ? ? ?CCA Biopsychosocial ?Intake/Chief Complaint:  No data recorded ?Current Symptoms/Problems: No data recorded ? ?Patient Reported Schizophrenia/Schizoaffective Diagnosis in Past: No ? ? ?Strengths: Pt has stable housing and a supportive family. ? ?Preferences: No data recorded ?Abilities: No data recorded ? ?Type of Services Patient Feels are Needed: No data recorded ? ?Initial Clinical Notes/Concerns: No data recorded ? ?Mental Health Symptoms ?Depression:   ?Hopelessness; Sleep (too much or little); Irritability ?  ?Duration of Depressive symptoms:  ?Less than two  weeks ?  ?Mania:   ?None ?  ?Anxiety:    ?Tension; Irritability ?  ?Psychosis:   ?None ?  ?Duration of Psychotic symptoms: No data recorded  ?Trauma:   ?N/A ?  ?Obsessions:   ?None ?  ?Compulsions:   ?None ?  ?Inattention:   ?None ?  ?Hyperactivity/Impulsivity:   ?None ?  ?Oppositional/Defiant Behaviors:   ?Easily annoyed ?  ?Emotional Irregularity:   ?Recurrent suicidal behaviors/gestures/threats ?  ?Other Mood/Personality Symptoms:  No data recorded  ? ?Mental Status Exam ?Appearance and self-care  ?Stature:   ?Average ?  ?Weight:   ?Average weight ?  ?Clothing:   ?Casual ?  ?Grooming:   ?Normal ?  ?Cosmetic use:   ?None ?  ?Posture/gait:   ?Normal ?  ?Motor activity:   ?Not Remarkable ?  ?Sensorium  ?Attention:   ?Normal ?  ?Concentration:   ?Normal ?  ?Orientation:   ?Object; Situation; Place; Person ?  ?Recall/memory:   ?Normal ?  ?Affect and Mood  ?Affect:   ?Negative ?  ?Mood:   ?Irritable ?  ?Relating  ?Eye contact:   ?Normal ?  ?Facial expression:   ?Responsive ?  ?Attitude toward examiner:   ?Sarcastic; Irritable ?  ?Thought and Language  ?Speech flow:  ?Clear  and Coherent ?  ?Thought content:   ?Appropriate to Mood and Circumstances ?  ?Preoccupation:   ?None ?  ?Hallucinations:   ?None ?  ?Organization:  No data recorded  ?Executive Functions  ?Fund of Knowledge:   ?Average ?  ?Intelligence:   ?Average ?  ?Abstraction:   ?Normal ?  ?Judgement:   ?Impaired ?  ?Reality Testing:   ?Adequate ?  ?Insight:   ?Shallow ?  ?Decision Making:   ?Impulsive ?  ?Social Functioning  ?Social Maturity:   ?Impulsive ?  ?Social Judgement:   ?Heedless ?  ?Stress  ?Stressors:   ?Family conflict ?  ?Coping Ability:   ?Overwhelmed ?  ?Skill Deficits:   ?Interpersonal; Self-control ?  ?Supports:   ?Family; Support needed ?  ? ? ?Religion: ?Religion/Spirituality ?Are You A Religious Person?:  (UTA) ?How Might This Affect Treatment?: N/A ? ?Leisure/Recreation: ?Leisure / Recreation ?Do You Have Hobbies?:  No ? ?Exercise/Diet: ?Exercise/Diet ?Do You Exercise?: No ?Have You Gained or Lost A Significant Amount of Weight in the Past Six Months?: No ?Do You Follow a Special Diet?: No ?Do You Have Any Trouble Sleeping?: Yes ?Explanati

## 2021-11-06 NOTE — ED Provider Notes (Signed)
? ?[facility] ?Provider Note ? ? ? Event Date/Time  ? First MD Initiated Contact with Patient 11/06/21 1520   ?  (approximate) ? ? ?History  ? ?Psychiatric Evaluation ? ? ?HPI ? ?Jackie Garcia is a 81 y.o. female with history of COPD, anxiety, dementia, hypertension, here with suicidal ideation.  Patient reportedly has been refusing to go to doctor's visits and taking her medication.  She is been increasingly confused.  She told her daughter today that she wanted to slit her own throat.  Patient has become increasingly paranoid.  She is refusing to eat.  Daughter took IVC out today due to this behavior.  On my interview, patient is somewhat evasive.  She does admit that she wanted to kill herself.  She states she has a cough but always has a cough.  Denies any other complaints. ?  ? ? ?Physical Exam  ? ?Triage Vital Signs: ?ED Triage Vitals  ?Enc Vitals Group  ?   BP 11/06/21 1451 120/62  ?   Pulse Rate 11/06/21 1451 86  ?   Resp 11/06/21 1451 18  ?   Temp 11/06/21 1451 97.9 ?F (36.6 ?C)  ?   Temp Source 11/06/21 1451 Oral  ?   SpO2 11/06/21 1451 94 %  ?   Weight 11/06/21 1452 194 lb 0.1 oz (88 kg)  ?   Height 11/06/21 1452 5\' 6"  (1.676 m)  ?   Head Circumference --   ?   Peak Flow --   ?   Pain Score 11/06/21 1452 0  ?   Pain Loc --   ?   Pain Edu? --   ?   Excl. in GC? --   ? ? ?Most recent vital signs: ?Vitals:  ? 11/06/21 1920 11/07/21 0014  ?BP: (!) 136/53 (!) 151/81  ?Pulse: 80 70  ?Resp: 18 17  ?Temp:  98 ?F (36.7 ?C)  ?SpO2: 98% 95%  ? ? ? ?General: Awake, no distress.  ?CV:  Good peripheral perfusion. RRR, no murmurs or rubs. ?Resp:  Normal effort. Slight wheezing bilaterally. Normal WOB. ?Abd:  No distention.  ?Other:  No LE swelling. ?  Endorses depressed mood with SI. ? ? ?ED Results / Procedures / Treatments  ? ?Labs ?(all labs ordered are listed, but only abnormal results are displayed) ?Labs Reviewed  ?COMPREHENSIVE METABOLIC PANEL - Abnormal; Notable for the following  components:  ?    Result Value  ? Glucose, Bld 107 (*)   ? All other components within normal limits  ?SALICYLATE LEVEL - Abnormal; Notable for the following components:  ? Salicylate Lvl <7.0 (*)   ? All other components within normal limits  ?ACETAMINOPHEN LEVEL - Abnormal; Notable for the following components:  ? Acetaminophen (Tylenol), Serum <10 (*)   ? All other components within normal limits  ?CBC - Abnormal; Notable for the following components:  ? WBC 11.0 (*)   ? RBC 5.72 (*)   ? Hemoglobin 16.1 (*)   ? HCT 50.0 (*)   ? All other components within normal limits  ?BLOOD GAS, VENOUS - Abnormal; Notable for the following components:  ? Bicarbonate 30.4 (*)   ? Acid-Base Excess 4.5 (*)   ? All other components within normal limits  ?RESP PANEL BY RT-PCR (FLU A&B, COVID) ARPGX2  ?CULTURE, BLOOD (ROUTINE X 2)  ?CULTURE, BLOOD (ROUTINE X 2)  ?ETHANOL  ?BRAIN NATRIURETIC PEPTIDE  ?BASIC METABOLIC PANEL  ?CBC  ?HIV ANTIBODY (ROUTINE TESTING W  REFLEX)  ?URINE DRUG SCREEN, QUALITATIVE ([facility] ONLY)  ?URINALYSIS, ROUTINE W REFLEX MICROSCOPIC  ? ? ? ?EKG ?Sinus rhythm, ventricular rate 71.  PR 177, QRS 90, QTc 440.  No acute ST elevations or depressions.  LVH.  Sinus pause. ? ? ?RADIOLOGY ?Chest x-ray: Increased interstitial pattern, concern for dissection pneumonia versus edema ? ? ?I also independently reviewed and agree wit radiologist interpretations. ? ? ?PROCEDURES: ? ?Critical Care performed: No ? ? ?.1-3 Lead EKG Interpretation ?Performed by: Shaune PollackIsaacs, Royal Vandevoort, MD ?Authorized by: Shaune PollackIsaacs, Lorik Guo, MD  ? ?  Interpretation: normal   ?  ECG rate:  80-90 ?  ECG rate assessment: normal   ?  Ectopy: none   ?  Conduction: normal   ?Comments:  ?   Indication: Weakness ? ? ?MEDICATIONS ORDERED IN ED: ?Medications  ?albuterol (VENTOLIN HFA) 108 (90 Base) MCG/ACT inhaler 2 puff (has no administration in time range)  ?mometasone-formoterol (DULERA) 100-5 MCG/ACT inhaler 2 puff (2 puffs Inhalation Given 11/06/21 2121)   ?pantoprazole (PROTONIX) EC tablet 40 mg (40 mg Oral Given 11/06/21 1900)  ?haloperidol lactate (HALDOL) injection 2 mg (2 mg Intramuscular Given 11/06/21 2140)  ?LORazepam (ATIVAN) injection 0.5 mg (has no administration in time range)  ?amLODipine (NORVASC) tablet 10 mg (has no administration in time range)  ?rosuvastatin (CRESTOR) tablet 5 mg (has no administration in time range)  ?sertraline (ZOLOFT) tablet 100 mg (has no administration in time range)  ?traZODone (DESYREL) tablet 100 mg (has no administration in time range)  ?levothyroxine (SYNTHROID) tablet 50 mcg (has no administration in time range)  ?polyethylene glycol (MIRALAX / GLYCOLAX) packet 17 g (has no administration in time range)  ?senna (SENOKOT) tablet 17.2 mg (has no administration in time range)  ?multivitamin with minerals tablet (has no administration in time range)  ?enoxaparin (LOVENOX) injection 45 mg (has no administration in time range)  ?cefTRIAXone (ROCEPHIN) 2 g in sodium chloride 0.9 % 100 mL IVPB (has no administration in time range)  ?azithromycin (ZITHROMAX) 500 mg in sodium chloride 0.9 % 250 mL IVPB (has no administration in time range)  ?0.9 % NaCl with KCl 20 mEq/ L  infusion (has no administration in time range)  ?acetaminophen (TYLENOL) tablet 650 mg (has no administration in time range)  ?  Or  ?acetaminophen (TYLENOL) suppository 650 mg (has no administration in time range)  ?magnesium hydroxide (MILK OF MAGNESIA) suspension 30 mL (has no administration in time range)  ?ondansetron (ZOFRAN) tablet 4 mg (has no administration in time range)  ?  Or  ?ondansetron (ZOFRAN) injection 4 mg (has no administration in time range)  ?ipratropium-albuterol (DUONEB) 0.5-2.5 (3) MG/3ML nebulizer solution 3 mL (has no administration in time range)  ?predniSONE (DELTASONE) tablet 40 mg (has no administration in time range)  ?ipratropium-albuterol (DUONEB) 0.5-2.5 (3) MG/3ML nebulizer solution 3 mL (has no administration in time range)   ?LORazepam (ATIVAN) injection 1-2 mg (2 mg Intramuscular Given 11/07/21 0057)  ?ipratropium-albuterol (DUONEB) 0.5-2.5 (3) MG/3ML nebulizer solution 3 mL (3 mLs Nebulization Given 11/06/21 1933)  ?ipratropium-albuterol (DUONEB) 0.5-2.5 (3) MG/3ML nebulizer solution 3 mL (3 mLs Nebulization Given 11/06/21 1933)  ?predniSONE (DELTASONE) tablet 40 mg (40 mg Oral Given 11/06/21 2119)  ? ? ? ?IMPRESSION / MDM / ASSESSMENT AND PLAN / ED COURSE  ?I reviewed the triage vital signs and the nursing notes. ?             ?               ? ? ?  The patient is on the cardiac monitor to evaluate for evidence of arrhythmia and/or significant heart rate changes. ? ? ?Ddx:  ? ? ? ?MDM:  ?81 yo F with PMHx dementia, COPD here with suicidal ideation, under IVC. Pt also c/o cough, increased sputum production. Suspect primary issue is depression w/ SI, with likely need for geripsych placement based on her history. However, pt also c/o cough with wheezing, sputum production. Mild leukocytosis noted. CXR reviewed and shows possible interstitial PNA. BNP normal, so suspect PNA more likely. BMP with normal renal function. VBG without retention. Given her age, h/o COPD, will tx for possible atypical PNA and admit, while also consulting Psych for eventual placement.  ? ? ?MEDICATIONS GIVEN IN ED: ?Medications  ?albuterol (VENTOLIN HFA) 108 (90 Base) MCG/ACT inhaler 2 puff (has no administration in time range)  ?mometasone-formoterol (DULERA) 100-5 MCG/ACT inhaler 2 puff (2 puffs Inhalation Given 11/06/21 2121)  ?pantoprazole (PROTONIX) EC tablet 40 mg (40 mg Oral Given 11/06/21 1900)  ?haloperidol lactate (HALDOL) injection 2 mg (2 mg Intramuscular Given 11/06/21 2140)  ?LORazepam (ATIVAN) injection 0.5 mg (has no administration in time range)  ?amLODipine (NORVASC) tablet 10 mg (has no administration in time range)  ?rosuvastatin (CRESTOR) tablet 5 mg (has no administration in time range)  ?sertraline (ZOLOFT) tablet 100 mg (has no administration in  time range)  ?traZODone (DESYREL) tablet 100 mg (has no administration in time range)  ?levothyroxine (SYNTHROID) tablet 50 mcg (has no administration in time range)  ?polyethylene glycol (MIRALAX / GLYCOLAX) packet 17

## 2021-11-06 NOTE — ED Triage Notes (Signed)
Pt comes into the ED via [place] [employer] under IVC for psychiatric evaluation. Pt was IVC'ed by her daughter who states the patient has a h/o dementia, COPD, and depression.  Pt has been refusing to go to MD appts and then she made a statement to her daughter saying "Im looking for a knife to slip my throat because I'm bored".  Pt does admit she said these things to her daughter today.  PT calm and cooperative at this time and in NAD.  ?

## 2021-11-06 NOTE — ED Notes (Signed)
IVC PENDING  CONSULT ?

## 2021-11-06 NOTE — ED Notes (Signed)
Ambulated pt.  O2 sats ranged from 92-95 while walking ?

## 2021-11-06 NOTE — ED Notes (Signed)
This RN attempted to start ABX with previous IV that primary RN started. Pt ripped her IV out. Psych currently at [facility]. Will attempt another IV after psych leaves ?

## 2021-11-06 NOTE — ED Notes (Signed)
Pt given meal tray and sprite. 

## 2021-11-06 NOTE — ED Notes (Addendum)
Personal Belongings: ? ?Multicolored shirt/blouse ?Black pants ?White socks ?Black shoes ?Brown cane ?Gray rings- 1 yellow stone, 1 green stone ?White bra ?

## 2021-11-06 NOTE — ED Notes (Signed)
Pt is refusing all antibiotics and IV at this time. Will Notify Provider ?

## 2021-11-06 NOTE — ED Notes (Signed)
AAOx3.  Skin warm and dry. NAD.  Patient states she has thoughts of harming self.  Reason's for wanting to harm self are unclear.  Patient states she lives with her daughter and that her daughter has been "hard on me lately".  Patietn is calm and cooperative.  Resting in bed at this time. ?

## 2021-11-06 NOTE — H&P (Addendum)
?  ?  ?[place] ? ? ?PATIENT NAME: Jackie Garcia   ? ?MR#:  [id number] ? ?DATE OF BIRTH:  26-Aug-1940 ? ?DATE OF ADMISSION:  11/06/2021 ? ?PRIMARY CARE PHYSICIAN: Jackie Bouche, DO  ? ?Patient is coming from: Home ? ?REQUESTING/REFERRING PHYSICIAN: Shaune Pollack, MD ? ?CHIEF COMPLAINT:  ? ?Chief Complaint  ?Patient presents with  ?? Psychiatric Evaluation  ? ? ?HISTORY OF PRESENT ILLNESS:  ?Jackie Garcia is a 81 y.o. female with medical history significant for emphysema, GERD, dementia, anxiety and hypothyroidism, who presented to the emergency room with acute onset of suicidal ideation.  Daughter.  The patient was IVC.  While she was here she was more confused.  She admitted to cough productive of clear sputum as well as wheezing and dyspnea.  No fever or chills.  No nausea or vomiting or abdominal pain.  No chest pain or palpitations.  No headache or dizziness or blurred vision. ? ?ED Course: Upon presentation to the emergency room, vital signs were within normal.  Labs revealed venous blood gas with HCO3 of 27.7 and pH 7.4.  CBC showed leukocytosis of 11 with hemoconcentration.  Tylenol level was less than 10 and salicylate less than 7 and alcohol level was less than 10. ?EKG as reviewed by me : EKG showed sinus rhythm with a rate of 71 with supraventricular bigeminy and sinus pause with LVH. ?Imaging: Chest x-ray showed increased interstitial markings in both lungs more on the right suggesting asymmetric interstitial edema or bilateral interstitial pneumonia with no focal pulmonary consolidation.  There is blunting of both bilateral CP angles suggesting small bilateral pleural effusions. ? ?The patient was given DuoNebs twice and 40 mg of p.o. prednisone as well as IV Rocephin and Zithromax.  She will be admitted to a medical telemetry bed ? ?PAST MEDICAL HISTORY:  ? ?Past Medical History:  ?Diagnosis Date  ?? Emphysema lung (HCC)   ?? Family history of adverse reaction to anesthesia   ? Daughter -  PONV  ?? GERD (gastroesophageal reflux disease)   ?? Hypothyroidism   ?? Thyroid disease   ? ? ?PAST SURGICAL HISTORY:  ? ?Past Surgical History:  ?Procedure Laterality Date  ?? MYRINGOTOMY WITH TUBE PLACEMENT Left 06/06/2021  ? Procedure: LEFT MYRINGOTOMY WITH BUTTERFLY TUBE PLACEMENT;  Surgeon: Jackie Murders, MD;  Location: [facility];  Service: ENT;  Laterality: Left;  ?? TUBAL LIGATION    ? ? ?SOCIAL HISTORY:  ? ?Social History  ? ?Tobacco Use  ?? Smoking status: Every Day  ?  Packs/day: 0.10  ?  Years: 40.00  ?  Pack years: 4.00  ?  Types: Cigarettes  ?? Smokeless tobacco: Never  ?? Tobacco comments:  ?  Started smoking in her "40s".  Was 1 PPD.  Now down to 1-2 cigs/day.  ?Substance Use Topics  ?? Alcohol use: Never  ? ? ?FAMILY HISTORY:  ? ?Family History  ?Problem Relation Age of Onset  ?? Lung cancer Father   ?? Diabetes Mellitus II Sister   ? ? ?DRUG ALLERGIES:  ? ?Allergies  ?Allergen Reactions  ?? Naproxen Swelling  ?? Sulfa Antibiotics Rash  ?? Codeine Itching  ? ? ?REVIEW OF SYSTEMS:  ? ?ROS ?As per history of present illness. All pertinent systems were reviewed above. Constitutional, HEENT, cardiovascular, respiratory, GI, GU, musculoskeletal, neuro, psychiatric, endocrine, integumentary and hematologic systems were reviewed and are otherwise negative/unremarkable except for positive findings mentioned above in the HPI. ? ? ?MEDICATIONS AT HOME:  ? ?  Prior to Admission medications   ?Medication Sig Start Date End Date Taking? Authorizing Provider  ?albuterol (VENTOLIN HFA) 108 (90 Base) MCG/ACT inhaler Inhale 2 puffs into the lungs every 4 (four) hours as needed for wheezing or shortness of breath. 04/20/21   Jackie Gong, MD  ?budesonide-formoterol [facility]) 80-4.5 MCG/ACT inhaler Inhale 2 puffs into the lungs in the morning and at bedtime. 10/09/20   Jackie Cantor, MD  ?levothyroxine (SYNTHROID) 88 MCG tablet Take 88 mcg by mouth daily before breakfast. Daughter is unsure of dosage     [provider]  ?Multiple Vitamin (MULTIVITAMIN PO) Take by mouth daily.    [provider]  ?omeprazole (PRILOSEC) 20 MG capsule Take 20 mg by mouth daily. 05/08/20   [provider]  ?sertraline (ZOLOFT) 50 MG tablet Take 50 mg by mouth daily. 05/08/20   [provider]  ?Spacer/Aero-Holding Chambers (AEROCHAMBER PLUS) inhaler Use with inhaler 04/20/21   Jackie Gong, MD  ? ?  ? ?VITAL SIGNS:  ?Blood pressure 105/74, pulse 64, temperature 98 ?F (36.7 ?C), temperature source Oral, resp. rate 17, height 5\' 6"  (1.676 m), weight 88 kg, SpO2 90 %. ? ?PHYSICAL EXAMINATION:  ?Physical Exam ? ?GENERAL:  81 y.o.-year-old Caucasian female patient lying in the bed with no acute distress.  ?EYES: Pupils equal, round, reactive to light and accommodation. No scleral icterus. Extraocular muscles intact.  ?HEENT: Head atraumatic, normocephalic. Oropharynx and nasopharynx clear.  ?NECK:  Supple, no jugular venous distention. No thyroid enlargement, no tenderness.  ?LUNGS: Diminished bibasilar breath sounds with bibasal crackles.   ?CARDIOVASCULAR: Regular rate and rhythm, S1, S2 normal. No murmurs, rubs, or gallops.  ?ABDOMEN: Soft, nondistended, nontender. Bowel sounds present. No organomegaly or mass.  ?EXTREMITIES: No pedal edema, cyanosis, or clubbing.  ?NEUROLOGIC: Cranial nerves II through XII are intact. Muscle strength 5/5 in all extremities. Sensation intact. Gait not checked.  ?PSYCHIATRIC: The patient is alert and oriented x 3.  Normal affect and good eye contact. ?SKIN: No obvious rash, lesion, or ulcer.  ? ?LABORATORY PANEL:  ? ?CBC ?Recent Labs  ?Lab 11/06/21 ?1455  ?WBC 11.0*  ?HGB 16.1*  ?HCT 50.0*  ?PLT 291  ? ?------------------------------------------------------------------------------------------------------------------ ? ?Chemistries  ?Recent Labs  ?Lab 11/06/21 ?1455  ?NA 143  ?K 3.5  ?CL 106  ?CO2 25  ?GLUCOSE 107*  ?BUN 21  ?CREATININE 0.78  ?CALCIUM 9.6  ?AST 30   ?ALT 22  ?ALKPHOS 72  ?BILITOT 0.4  ? ?------------------------------------------------------------------------------------------------------------------ ? ?Cardiac Enzymes ?No results for input(s): TROPONINI in the last 168 hours. ?------------------------------------------------------------------------------------------------------------------ ? ?RADIOLOGY:  ?CT HEAD WO CONTRAST (11/08/21) ? ?Result Date: 11/06/2021 ?CLINICAL DATA:  Mental status change, unknown cause EXAM: CT HEAD WITHOUT CONTRAST TECHNIQUE: Contiguous axial images were obtained from the base of the skull through the vertex without intravenous contrast. RADIATION DOSE REDUCTION: This exam was performed according to the departmental dose-optimization program which includes automated exposure control, adjustment of the mA and/or kV according to patient size and/or use of iterative reconstruction technique. COMPARISON:  Head CT 08/24/2011 FINDINGS: Brain: No evidence of acute intracranial hemorrhage or extra-axial collection.No evidence of mass lesion/concerning mass effect.The ventricles are normal in size.Scattered subcortical and periventricular white matter hypodensities, nonspecific but likely sequela of chronic small vessel ischemic disease.Progressive mild cerebral atrophy. Vascular: Vascular calcifications.  No hyperdense vessel. Skull: Normal. Negative for fracture or focal lesion. Sinuses/Orbits: No acute finding. Other: None. IMPRESSION: No acute intracranial abnormality. Mild sequela of chronic small vessel ischemic disease. Cerebral atrophy progressed since the  prior exam in February 2013. Electronically Signed   By: Caprice RenshawJacob  Kahn M.D.   On: 11/06/2021 15:52  ? ?DG Chest Portable 1 View ? ?Result Date: 11/06/2021 ?CLINICAL DATA:  Shortness of breath EXAM: PORTABLE CHEST 1 VIEW COMPARISON:  04/20/2021 FINDINGS: Transverse diameter of heart is increased. Thoracic aorta is tortuous. There is poor inspiration. Increased interstitial markings are  seen in both lungs, more so on the right side. There is no focal pulmonary consolidation. Lateral CP angles are indistinct. There is no pneumothorax. IMPRESSION: Increased interstitial markings are seen in

## 2021-11-07 DIAGNOSIS — J189 Pneumonia, unspecified organism: Secondary | ICD-10-CM | POA: Diagnosis not present

## 2021-11-07 DIAGNOSIS — F32A Depression, unspecified: Secondary | ICD-10-CM

## 2021-11-07 DIAGNOSIS — I10 Essential (primary) hypertension: Secondary | ICD-10-CM

## 2021-11-07 DIAGNOSIS — E785 Hyperlipidemia, unspecified: Secondary | ICD-10-CM

## 2021-11-07 DIAGNOSIS — J441 Chronic obstructive pulmonary disease with (acute) exacerbation: Secondary | ICD-10-CM | POA: Diagnosis not present

## 2021-11-07 LAB — CBC
HCT: 44.6 % (ref 36.0–46.0)
Hemoglobin: 14.6 g/dL (ref 12.0–15.0)
MCH: 28.5 pg (ref 26.0–34.0)
MCHC: 32.7 g/dL (ref 30.0–36.0)
MCV: 87.1 fL (ref 80.0–100.0)
Platelets: 230 10*3/uL (ref 150–400)
RBC: 5.12 MIL/uL — ABNORMAL HIGH (ref 3.87–5.11)
RDW: 14.8 % (ref 11.5–15.5)
WBC: 8.4 10*3/uL (ref 4.0–10.5)
nRBC: 0 % (ref 0.0–0.2)

## 2021-11-07 LAB — URINE DRUG SCREEN, QUALITATIVE (ARMC ONLY)
Amphetamines, Ur Screen: NOT DETECTED
Barbiturates, Ur Screen: NOT DETECTED
Benzodiazepine, Ur Scrn: POSITIVE — AB
Cannabinoid 50 Ng, Ur ~~LOC~~: NOT DETECTED
Cocaine Metabolite,Ur ~~LOC~~: NOT DETECTED
MDMA (Ecstasy)Ur Screen: NOT DETECTED
Methadone Scn, Ur: NOT DETECTED
Opiate, Ur Screen: NOT DETECTED
Phencyclidine (PCP) Ur S: NOT DETECTED
Tricyclic, Ur Screen: NOT DETECTED

## 2021-11-07 LAB — BASIC METABOLIC PANEL
Anion gap: 7 (ref 5–15)
BUN: 19 mg/dL (ref 8–23)
CO2: 24 mmol/L (ref 22–32)
Calcium: 8.5 mg/dL — ABNORMAL LOW (ref 8.9–10.3)
Chloride: 109 mmol/L (ref 98–111)
Creatinine, Ser: 0.73 mg/dL (ref 0.44–1.00)
GFR, Estimated: >60 mL/min (ref >60)
Glucose, Bld: 147 mg/dL — ABNORMAL HIGH (ref 70–99)
Potassium: 4.4 mmol/L (ref 3.5–5.1)
Sodium: 140 mmol/L (ref 135–145)

## 2021-11-07 LAB — URINALYSIS, ROUTINE W REFLEX MICROSCOPIC
Bilirubin Urine: NEGATIVE
Glucose, UA: NEGATIVE mg/dL
Hgb urine dipstick: NEGATIVE
Ketones, ur: 5 mg/dL — AB
Leukocytes,Ua: NEGATIVE
Nitrite: NEGATIVE
Protein, ur: NEGATIVE mg/dL
Specific Gravity, Urine: 1.017 (ref 1.005–1.030)
pH: 5 (ref 5.0–8.0)

## 2021-11-07 LAB — STREP PNEUMONIAE URINARY ANTIGEN: Strep Pneumo Urinary Antigen: NEGATIVE

## 2021-11-07 MED ORDER — ENOXAPARIN SODIUM 40 MG/0.4ML IJ SOSY
40.0000 mg | PREFILLED_SYRINGE | Freq: Every day | INTRAMUSCULAR | Status: DC
Start: 2021-11-07 — End: 2021-11-19
  Administered 2021-11-08 – 2021-11-19 (×11): 40 mg via SUBCUTANEOUS
  Filled 2021-11-07 (×12): qty 0.4

## 2021-11-07 NOTE — Assessment & Plan Note (Addendum)
Completed steroids, condition resolved ?

## 2021-11-07 NOTE — Assessment & Plan Note (Addendum)
Continue statin. 

## 2021-11-07 NOTE — Progress Notes (Signed)
?PROGRESS NOTE ? ? ? ?Jackie HolmesMarcia K Garcia  ZOX:[id number] DOB: 08/26/1940 DOA: 11/06/2021 ?PCP: Araceli Boucheumley, [place] N, DO  ? ? ?Chief Complaint  ?Patient presents with  ? Psychiatric Evaluation  ? ? ?Brief Narrative:  ?81 year old woman with PMH of emphysema, GERD< dementia, anxiety, hypothyroidism presented with SI with daughter.  IVC placed.  Psychiatry consulted and agree need for inpatient BH.  Also with cough, confusion, wheezing, dyspnea and admitted for CAP and COPD exacerbation.  ? ?Assessment & Plan: ?  ?Community acquired pneumonia ?- Based on CXR, cough, production of sputum (clear) ?- Started on rocephin and azithromycin ?- Oxygen as needed ?- check urinary strep antigen ?- Attempt sputum culture ?- BC are NGTD ?- NS with potassium started at 100cc/hr - K initially 3.5, now 4.4, will discontinue ?- Tylenol if fever ? ?COPD exacerbation (HCC) ?Tobacco abuse ?- + wheezing on exam ?- Prednisone for 3-5 days ?- Duonebs ?- Continue singulair ?- Oxygen as needed ?   ?Hypothyroidism ?- Continue synthroid ?   ?Essential hypertension ?- Continue norvasc ?   ?Dyslipidemia ?- Continue statin ?   ?Depression ?- IVC, pending behavioral health placement ?- IM haldol, IV/IM ativan for agitation PRN ?- Continue sertraline ?- PRN trazodone for sleep ?- Anticipate if does well today and this evening, can be cleared to go to behavioral health with oral therapy ? ? ?DVT prophylaxis: Lovenox  ?Code Status: Full ? ?Disposition:  ? ?Status is: Inpatient ?Remains inpatient appropriate because: IVC for safety, acute pneumonia and COPD exacerbation ?  ?Consultants:  ?Psychiatry ? ?Antimicrobials:  ?Azithromycin 4/19 -->  ?Rocephin 4/19 -->   ? ? ?Subjective: ?Patient has flat affect, but reports doing well.  Notes coughing and some difficulty sleeping.  ? ?Objective: ?Vitals:  ? 11/07/21 0700 11/07/21 0755 11/07/21 0935 11/07/21 1549  ?BP: 106/67 103/63  (!) 124/59  ?Pulse: 80 64  91  ?Resp: 20 16  18   ?Temp:  98.3 ?F (36.8 ?C)  97.9 ?F  (36.6 ?C)  ?TempSrc:  Oral  Oral  ?SpO2: 97% 96% 95% 97%  ?Weight:      ?Height:      ? ? ?Intake/Output Summary (Last 24 hours) at 11/07/2021 1622 ?Last data filed at 11/07/2021 0319 ?Gross per 24 hour  ?Intake 344.69 ml  ?Output --  ?Net 344.69 ml  ? ?Filed Weights  ? 11/06/21 1452  ?Weight: 88 kg  ? ? ?Examination: ? ?General exam: Appears calm and comfortable, resting in bed ?Respiratory system: Crackles at bases, expiratory wheezing mid lung fields ?Cardiovascular system: RR, NR, no murmur ?Gastrointestinal system: +BS, NT, ND ?Central nervous system: Alert and oriented. No focal neurological deficits. ?Skin: No rashes, lesions or ulcers on exposed skin ?Psychiatry: Flat affect, judgement is poor currently ? ? ? ?Data Reviewed: ? ?CBC: ?Recent Labs  ?Lab 11/06/21 ?1455 11/07/21 ?[id number]  ?WBC 11.0* 8.4  ?HGB 16.1* 14.6  ?HCT 50.0* 44.6  ?MCV 87.4 87.1  ?PLT 291 230  ? ? ?Basic Metabolic Panel: ?Recent Labs  ?Lab 11/06/21 ?1455 11/07/21 ?[id number]  ?NA 143 140  ?K 3.5 4.4  ?CL 106 109  ?CO2 25 24  ?GLUCOSE 107* 147*  ?BUN 21 19  ?CREATININE 0.78 0.73  ?CALCIUM 9.6 8.5*  ? ? ?GFR: ?Estimated Creatinine Clearance: 61.6 mL/min (by C-G formula based on SCr of 0.73 mg/dL). ? ?Liver Function Tests: ?Recent Labs  ?Lab 11/06/21 ?1455  ?AST 30  ?ALT 22  ?ALKPHOS 72  ?BILITOT 0.4  ?PROT 7.7  ?ALBUMIN 3.9  ? ? ?  CBG: ?No results for input(s): GLUCAP in the last 168 hours. ? ? ?Recent Results (from the past 240 hour(s))  ?Blood culture (routine x 2)     Status: None (Preliminary result)  ? Collection Time: 11/06/21  8:51 PM  ? Specimen: BLOOD  ?Result Value Ref Range Status  ? Specimen Description BLOOD LEFT ASSIST CONTROL  Final  ? Special Requests   Final  ?  BOTTLES DRAWN AEROBIC AND ANAEROBIC Blood Culture adequate volume  ? Culture   Final  ?  NO GROWTH < 12 HOURS ?Performed at [facility], [address] [place], [place] [address] ?  ? Report Status PENDING  Incomplete  ?Resp Panel by RT-PCR (Flu A&B, Covid)  Nasopharyngeal Swab     Status: None  ? Collection Time: 11/06/21  8:51 PM  ? Specimen: Nasopharyngeal Swab; Nasopharyngeal(NP) swabs in vial transport medium  ?Result Value Ref Range Status  ? SARS Coronavirus 2 by RT PCR NEGATIVE NEGATIVE Final  ?  Comment: (NOTE) ?SARS-CoV-2 target nucleic acids are NOT DETECTED. ? ?The SARS-CoV-2 RNA is generally detectable in upper respiratory ?specimens during the acute phase of infection. The lowest ?concentration of SARS-CoV-2 viral copies this assay can detect is ?138 copies/mL. A negative result does not preclude SARS-Cov-2 ?infection and should not be used as the sole basis for treatment or ?other patient management decisions. A negative result may occur with  ?improper specimen collection/handling, submission of specimen other ?than nasopharyngeal swab, presence of viral mutation(s) within the ?areas targeted by this assay, and inadequate number of viral ?copies(<138 copies/mL). A negative result must be combined with ?clinical observations, patient history, and epidemiological ?information. The expected result is Negative. ? ?Fact Sheet for Patients:  ?BloggerCourse.com ? ?Fact Sheet for Healthcare Providers:  ?SeriousBroker.it ? ?This test is no t yet approved or cleared by the [place] FDA and  ?has been authorized for detection and/or diagnosis of SARS-CoV-2 by ?FDA under an Emergency Use Authorization (EUA). This EUA will remain  ?in effect (meaning this test can be used) for the duration of the ?COVID-19 declaration under Section 564(b)(1) of the Act, 21 ?U.S.C.section 360bbb-3(b)(1), unless the authorization is terminated  ?or revoked sooner.  ? ? ?  ? Influenza A by PCR NEGATIVE NEGATIVE Final  ? Influenza B by PCR NEGATIVE NEGATIVE Final  ?  Comment: (NOTE) ?The Xpert Xpress SARS-CoV-2/FLU/RSV plus assay is intended as an aid ?in the diagnosis of influenza from Nasopharyngeal swab specimens and ?should not be  used as a sole basis for treatment. Nasal washings and ?aspirates are unacceptable for Xpert Xpress SARS-CoV-2/FLU/RSV ?testing. ? ?Fact Sheet for Patients: ?BloggerCourse.com ? ?Fact Sheet for Healthcare Providers: ?SeriousBroker.it ? ?This test is not yet approved or cleared by the [place] FDA and ?has been authorized for detection and/or diagnosis of SARS-CoV-2 by ?FDA under an Emergency Use Authorization (EUA). This EUA will remain ?in effect (meaning this test can be used) for the duration of the ?COVID-19 declaration under Section 564(b)(1) of the Act, 21 U.S.C. ?section 360bbb-3(b)(1), unless the authorization is terminated or ?revoked. ? ?Performed at [facility], [address] [place], ?[place] [address] ?  ?Blood culture (routine x 2)     Status: None (Preliminary result)  ? Collection Time: 11/06/21  9:12 PM  ? Specimen: BLOOD  ?Result Value Ref Range Status  ? Specimen Description BLOOD LEFT FOREARM  Final  ? Special Requests   Final  ?  BOTTLES DRAWN AEROBIC AND ANAEROBIC Blood Culture results  may not be optimal due to an inadequate volume of blood received in culture bottles  ? Culture   Final  ?  NO GROWTH < 12 HOURS ?Performed at [facility], [address] [place], [place] [address] ?  ? Report Status PENDING  Incomplete  ?  ? ? ? ? ? ?Radiology Studies: ?CT HEAD WO CONTRAST ( ) ? ?Result Date: 11/06/2021 ?CLINICAL DATA:  Mental status change, unknown cause EXAM: CT HEAD WITHOUT CONTRAST TECHNIQUE: Contiguous axial images were obtained from the base of the skull through the vertex without intravenous contrast. RADIATION DOSE REDUCTION: This exam was performed according to the departmental dose-optimization program which includes automated exposure control, adjustment of the mA and/or kV according to patient size and/or use of iterative reconstruction technique. COMPARISON:  Head CT 08/24/2011 FINDINGS: Brain: No  evidence of acute intracranial hemorrhage or extra-axial collection.No evidence of mass lesion/concerning mass effect.The ventricles are normal in size.Scattered subcortical and periventricular white matter hypoden

## 2021-11-07 NOTE — ED Notes (Signed)
IVC medical admit 

## 2021-11-07 NOTE — Plan of Care (Signed)
?  Problem: Education: ?Goal: Knowledge of warning signs, risks, and behaviors that relate to suicide ideation and self-harm behaviors will improve ?Outcome: Not Progressing ?  ?Problem: Health Behavior/Discharge (Transition) Planning: ?Goal: Ability to manage health-related needs will improve ?Outcome: Not Progressing ?  ?Problem: Clinical Measurements: ?Goal: Remain free from any harm during hospitalization ?Outcome: Not Progressing ?  ?Problem: Nutrition: ?Goal: Adequate fluids and nutrition will be maintained ?Outcome: Not Progressing ?  ?Problem: Coping: ?Goal: Ability to disclose and discuss thoughts of suicide and self-harm will improve ?Outcome: Not Progressing ?  ?Problem: Medication Management: ?Goal: Adhere to prescribed medication regimen ?Outcome: Not Progressing ?  ?Problem: Sleep Hygiene: ?Goal: Ability to obtain adequate restful sleep will improve ?Outcome: Not Progressing ?  ?Problem: Self Esteem: ?Goal: Ability to verbalize positive feeling about self will improve ?Outcome: Not Progressing ?  ?

## 2021-11-07 NOTE — Evaluation (Signed)
Physical Therapy Evaluation ?Patient Details ?Name: Jackie Garcia ?MRN: [id number] ?DOB: [birth year] ?Today's Date: 11/07/2021 ? ?History of Present Illness ? Jackie Garcia is a 81 y.o. female with medical history significant for emphysema, GERD, dementia, anxiety and hypothyroidism, who presented to the emergency room with acute onset of suicidal ideation. The patient was IVC.  While she was here she was more confused.  She admitted to cough productive of clear sputum as well as wheezing and dyspnea.  No fever or chills.  No nausea or vomiting or abdominal pain.  No chest pain or palpitations.  No headache or dizziness or blurred vision.  ?Clinical Impression ? Patient received sleeping in bed. She wakes easily to my touch and voice. Patient agreeable to PT assessment. She is mod independent with bed mobility, transfers with supervision and ambulated in room with RW and supervision. Patient appears to be near baseline level of function. She reports she does not walk much at home. Normally ambulates with cane, so may try that next visit. She will continue to benefit from skilled PT while here to ensure safety at discharge.     ?   ? ?Recommendations for follow up therapy are one component of a multi-disciplinary discharge planning process, led by the attending physician.  Recommendations may be updated based on patient status, additional functional criteria and insurance authorization. ? ?Follow Up Recommendations Home health PT ? ?  ?Assistance Recommended at Discharge Intermittent Supervision/Assistance  ?Patient can return home with the following ? A little help with bathing/dressing/bathroom;Help with stairs or ramp for entrance;Assistance with cooking/housework;Assist for transportation ? ?  ?Equipment Recommendations Rolling walker (2 wheels)  ?Recommendations for Other Services ?    ?  ?Functional Status Assessment Patient has not had a recent decline in their functional status  ? ?  ?Precautions /  Restrictions Precautions ?Precautions: Fall ?Restrictions ?Weight Bearing Restrictions: No  ? ?  ? ?Mobility ? Bed Mobility ?Overal bed mobility: Modified Independent ?  ?  ?  ?  ?  ?  ?  ?  ? ?Transfers ?Overall transfer level: Modified independent ?Equipment used: Rolling walker (2 wheels) ?  ?  ?  ?  ?  ?  ?  ?  ?  ? ?Ambulation/Gait ?Ambulation/Gait assistance: Supervision ?Gait Distance (Feet): 25 Feet ?Assistive device: Rolling walker (2 wheels) ?Gait Pattern/deviations: Step-through pattern, Decreased step length - right, Decreased step length - left ?Gait velocity: decr ?  ?  ?General Gait Details: patient is generally safe with mobility over short distances with RW and supervision. No lob ? ?Stairs ?  ?  ?  ?  ?  ? ?Wheelchair Mobility ?  ? ?Modified Rankin (Stroke Patients Only) ?  ? ?  ? ?Balance Overall balance assessment: Modified Independent ?  ?  ?  ?  ?  ?  ?  ?  ?  ?  ?  ?  ?  ?  ?  ?  ?  ?  ?   ? ? ? ?Pertinent Vitals/Pain Pain Assessment ?Pain Assessment: No/denies pain  ? ? ?Home Living Family/patient expects to be discharged to:: Private residence ?Living Arrangements: Children ?Available Help at Discharge: Family;Available 24 hours/day ?Type of Home: Apartment ?Home Access: Level entry ?  ?  ?  ?Home Layout: One level ?Home Equipment: Kasandra Knudsen - single point ?   ?  ?Prior Function Prior Level of Function : Independent/Modified Independent;Needs assist ?  ?  ?  ?  ?  ?  ?Mobility Comments:  patient is limited household ambulator at baseline with [facility] per her report. ?ADLs Comments: Requires assist for bathing if getting into/out of tub, otherwise does sink baths. ?  ? ? ?Hand Dominance  ?   ? ?  ?Extremity/Trunk Assessment  ? Upper Extremity Assessment ?Upper Extremity Assessment: Defer to OT evaluation ?  ? ?Lower Extremity Assessment ?Lower Extremity Assessment: Generalized weakness ?  ? ?Cervical / Trunk Assessment ?Cervical / Trunk Assessment: Normal  ?Communication  ? Communication: HOH   ?Cognition Arousal/Alertness: Awake/alert ?Behavior During Therapy: [facility] for tasks assessed/performed ?Overall Cognitive Status: Within Functional Limits for tasks assessed ?  ?  ?  ?  ?  ?  ?  ?  ?  ?  ?  ?  ?  ?  ?  ?  ?  ?  ?  ? ?  ?General Comments   ? ?  ?Exercises    ? ?Assessment/Plan  ?  ?PT Assessment Patient needs continued PT services  ?PT Problem List Decreased strength;Decreased mobility;Decreased activity tolerance;Cardiopulmonary status limiting activity;Decreased knowledge of use of DME ? ?   ?  ?PT Treatment Interventions DME instruction;Therapeutic exercise;Gait training;Functional mobility training;Therapeutic activities;Patient/family education   ? ?PT Goals (Current goals can be found in the Care Plan section)  ?Acute Rehab PT Goals ?Patient Stated Goal: to go to [place] ?PT Goal Formulation: With patient ?Time For Goal Achievement: 11/21/21 ?Potential to Achieve Goals: Fair ? ?  ?Frequency Min 2X/week ?  ? ? ?Co-evaluation   ?  ?  ?  ?  ? ? ?  ?AM-PAC PT "6 Clicks" Mobility  ?Outcome Measure Help needed turning from your back to your side while in a flat bed without using bedrails?: None ?Help needed moving from lying on your back to sitting on the side of a flat bed without using bedrails?: None ?Help needed moving to and from a bed to a chair (including a wheelchair)?: A Little ?Help needed standing up from a chair using your arms (e.g., wheelchair or bedside chair)?: A Little ?Help needed to walk in hospital room?: A Little ?Help needed climbing 3-5 steps with a railing? : A Little ?6 Click Score: 20 ? ?  ?End of Session Equipment Utilized During Treatment: Oxygen ?Activity Tolerance: Patient tolerated treatment well ?Patient left: in bed ?Nurse Communication: Mobility status ?PT Visit Diagnosis: Muscle weakness (generalized) (M62.81) ?  ? ?Time: [id number] ?PT Time Calculation (min) (ACUTE ONLY): 15 min ? ? ?Charges:   PT Evaluation ?$PT Eval Moderate Complexity: 1 Mod ?  ?  ?    ? ? ?[employer], PT, [employer] ?11/07/21,9:48 AM ? ?

## 2021-11-07 NOTE — Evaluation (Signed)
Occupational Therapy Evaluation ?Patient Details ?Name: Jackie Garcia ?MRN: [id number] ?DOB: [birth year] ?Today's Date: 11/07/2021 ? ? ?History of Present Illness ADDALIE Garcia is a 81 y.o. female with medical history significant for emphysema, GERD, dementia, anxiety and hypothyroidism, who presented to the emergency room with acute onset of suicidal ideation. The patient was IVC.  While she was here she was more confused.  She admitted to cough productive of clear sputum as well as wheezing and dyspnea.  No fever or chills.  No nausea or vomiting or abdominal pain.  No chest pain or palpitations.  No headache or dizziness or blurred vision.  ? ?Clinical Impression ?  ?Patient presenting with decreased Ind in self care, balance, functional mobility/transfers, endurance, and safety awareness. Patient reports living with daughter who is available 24/7 to assist her as needed. Pt reports engaging in only short distance ambulation at home. Daughter assists her with ADLs .Patient currently functioning this session pt ambulates and performs self care tasks in room without use of RW and supervision - min guard. Pt reports this is close to her baseline.  Patient will benefit from acute OT to increase overall independence in the areas of ADLs, functional mobility, and safety awareness in order to safely discharge home with family.  ?   ? ?Recommendations for follow up therapy are one component of a multi-disciplinary discharge planning process, led by the attending physician.  Recommendations may be updated based on patient status, additional functional criteria and insurance authorization.  ? ?Follow Up Recommendations ? Home health OT  ?  ?Assistance Recommended at Discharge Intermittent Supervision/Assistance  ?Patient can return home with the following A little help with walking and/or transfers;A little help with bathing/dressing/bathroom;Help with stairs or ramp for entrance;Assist for transportation;Direct  supervision/assist for financial management;Direct supervision/assist for medications management ? ?  ?Functional Status Assessment ? Patient has had a recent decline in their functional status and demonstrates the ability to make significant improvements in function in a reasonable and predictable amount of time.  ?Equipment Recommendations ? None recommended by OT  ?  ?   ?Precautions / Restrictions Precautions ?Precautions: Fall ?Restrictions ?Weight Bearing Restrictions: No  ? ?  ? ?Mobility Bed Mobility ?Overal bed mobility: Modified Independent ?  ?  ?  ?  ?  ?  ?General bed mobility comments: from ED stretcher ?  ? ?Transfers ?Overall transfer level: Needs assistance ?Equipment used: None ?Transfers: Sit to/from Stand, Bed to chair/wheelchair/BSC ?Sit to Stand: Supervision ?  ?  ?Step pivot transfers: Min guard ?  ?  ?  ?  ? ?  ?Balance Overall balance assessment: Mild deficits observed, not formally tested ?  ?  ?  ?  ?  ?  ?  ?  ?  ?  ?  ?  ?  ?  ?  ?  ?  ?  ?   ? ?ADL either performed or assessed with clinical judgement  ? ?ADL Overall ADL's : Needs assistance/impaired ?  ?  ?Grooming: Wash/dry hands;Wash/dry face;Sitting;Set up;Supervision/safety ?  ?  ?  ?  ?  ?  ?  ?  ?  ?  ?  ?  ?  ?  ?  ?Functional mobility during ADLs: Min guard;Supervision/safety ?   ? ? ? ?Vision Patient Visual Report: No change from baseline ?   ?   ?   ?   ? ?Pertinent Vitals/Pain Pain Assessment ?Pain Assessment: No/denies pain  ? ? ? ?Extremity/Trunk Assessment Upper Extremity  Assessment ?Upper Extremity Assessment: Defer to OT evaluation ?  ?Lower Extremity Assessment ?Lower Extremity Assessment: Generalized weakness ?  ?Cervical / Trunk Assessment ?Cervical / Trunk Assessment: Normal ?  ?Communication Communication ?Communication: HOH ?  ?Cognition Arousal/Alertness: Awake/alert ?Behavior During Therapy: [facility] for tasks assessed/performed ?Overall Cognitive Status: Within Functional Limits for tasks assessed ?  ?  ?  ?  ?  ?  ?   ?  ?  ?  ?  ?  ?  ?  ?  ?  ?General Comments: no family present to confirm ?  ?  ?   ?   ?   ? ? ?Home Living Family/patient expects to be discharged to:: Private residence ?Living Arrangements: Children ?Available Help at Discharge: Family;Available 24 hours/day ?Type of Home: Apartment ?Home Access: Level entry ?  ?  ?Home Layout: One level ?  ?  ?Bathroom Shower/Tub: Sponge bathes at baseline;Tub/shower unit ?  ?Bathroom Toilet: Standard ?  ?  ?Home Equipment: Cane - single point;BSC/3in1 ?  ?  ?  ? ?  ?Prior Functioning/Environment Prior Level of Function : Independent/Modified Independent;Needs assist ?  ?  ?  ?  ?  ?  ?Mobility Comments: patient is limited household ambulator at baseline with SPC per her report. ?ADLs Comments: Requires assist for bathing if getting into/out of tub, otherwise does sink baths. ?  ? ?  ?  ?OT Problem List: Decreased strength;Decreased activity tolerance;Impaired balance (sitting and/or standing);Decreased safety awareness ?  ?   ?OT Treatment/Interventions: Self-care/ADL training;Therapeutic exercise;Patient/family education;Balance training;Neuromuscular education;Energy conservation;Therapeutic activities;DME and/or AE instruction;Cognitive remediation/compensation;Manual therapy;Modalities  ?  ?OT Goals(Current goals can be found in the care plan section) Acute Rehab OT Goals ?Patient Stated Goal: to rest ?OT Goal Formulation: With patient ?Time For Goal Achievement: 11/21/21 ?Potential to Achieve Goals: Fair  ?OT Frequency: Min 2X/week ?  ? ?   ?AM-PAC OT "6 Clicks" Daily Activity     ?Outcome Measure Help from another person eating meals?: None ?Help from another person taking care of personal grooming?: None ?Help from another person toileting, which includes using toliet, bedpan, or urinal?: A Little ?Help from another person bathing (including washing, rinsing, drying)?: A Little ?Help from another person to put on and taking off regular upper body clothing?: None ?Help  from another person to put on and taking off regular lower body clothing?: A Little ?6 Click Score: 21 ?  ?End of Session Nurse Communication: Mobility status ? ?Activity Tolerance: Patient tolerated treatment well ?Patient left: in bed;with call bell/phone within reach;with bed alarm set ? ?OT Visit Diagnosis: Unsteadiness on feet (R26.81);Muscle weakness (generalized) (M62.81)  ?              ?Time: 9563-8756 ?OT Time Calculation (min): 18 min ?Charges:  OT General Charges ?$OT Visit: 1 Visit ?OT Evaluation ?$OT Eval Low Complexity: 1 Low ?OT Treatments ?$Self Care/Home Management : 8-22 mins ? ?Jackquline Denmark, MS, OTR/L , CBIS ?ascom [phone]  ?11/07/21, 1:18 PM  ?

## 2021-11-07 NOTE — Assessment & Plan Note (Addendum)
Completed antibiotics 

## 2021-11-07 NOTE — Assessment & Plan Note (Addendum)
Patient was evaluated by psychiatry, not able to take patient to [facility] psych due to history of dementia.   ?Patient currently is medically stable to be discharged, physical therapy evaluate the patient, does not need nursing home placement.  Granddaughter indicated that she will take patient home. ?Patient medical condition stable, ready for discharge.  Granddaughters home is not ready.  She was discharged, granddaughter filed appeal.  Appeal was also denied, he indicated that she will file an reconsideration for the denial.   ? ? ?

## 2021-11-07 NOTE — Assessment & Plan Note (Addendum)
Norvasc.  Blood pressure controlled. ?

## 2021-11-07 NOTE — ED Notes (Signed)
INVOLUNTARY awaiting a medical admit but needs sitter for IVC in Place ?

## 2021-11-07 NOTE — Consult Note (Signed)
[facility] Face-to-Face Psychiatry Consult  ? ?Reason for Consult: Psychiatric Evaluation  ?Referring Physician: Dr. Erma Heritage ?Patient Identification: Jackie Garcia ?MRN:  [id number] ?Principal Diagnosis: COPD exacerbation (HCC) ?Diagnosis:  Principal Problem: ?  COPD exacerbation (HCC) ?Active Problems: ?  Tobacco abuse ? ? ?Total Time spent with patient: 45 minutes ? ?Subjective:  My daughter treats me like crap."  ?IVETTE CASTRONOVA is a 81 y.o. female patient presented to [facility] ED via law enforcement and under IVC. Per triage nurse note, Pt comes into the ED via [place] [employer] under IVC for psychiatric evaluation. Pt was IVC'ed by her daughter who states the patient has a h/o dementia, COPD, and depression.  Pt has been refusing to go to MD appts and then she made a statement to her daughter saying "Im looking for a knife to slip my throat because I'm bored".  Pt does admit she said these things to her daughter today. ?This provider saw the patient face-to-face; the chart was reviewed, and consulted with Dr.Isaacs on 11/06/2021 due to the patient's care. It was discussed with the EDP that the patient does meet the criteria to be admitted to the inpatient unit. On evaluation, the patient is alert and oriented x 3, irritated but cooperative, and mood-congruent with affect. The patient does not appear to be responding to internal or external stimuli. The patient presented with some delusional thinking. The patient denies auditory or visual hallucinations. The patient denies any suicidal, homicidal, or self-harm ideations. The patient is not presenting with any psychotic or paranoid behaviors. During an encounter with the patient, she could answer questions appropriately. ? ?HPI: Per Dr. Erma Heritage, Jackie Garcia is a 81 y.o. female with history of COPD, anxiety, dementia, hypertension, here with suicidal ideation.  Patient reportedly has been refusing to go to doctor's visits and taking her medication.  She is been  increasingly confused.  She told her daughter today that she wanted to slit her own throat.  Patient has become increasingly paranoid.  She is refusing to eat.  Daughter took IVC out today due to this behavior.  On my interview, patient is somewhat evasive.  She does admit that she wanted to kill herself.  She states she has a cough but always has a cough.  Denies any other complaints. ? ?Past Psychiatric History:  ? ?Risk to Self:   ?Risk to Others:   ?Prior Inpatient Therapy:   ?Prior Outpatient Therapy:   ? ?Past Medical History:  ?Past Medical History:  ?Diagnosis Date  ? Emphysema lung (HCC)   ? Family history of adverse reaction to anesthesia   ? Daughter - PONV  ? GERD (gastroesophageal reflux disease)   ? Hypothyroidism   ? Thyroid disease   ?  ?Past Surgical History:  ?Procedure Laterality Date  ? MYRINGOTOMY WITH TUBE PLACEMENT Left 06/06/2021  ? Procedure: LEFT MYRINGOTOMY WITH BUTTERFLY TUBE PLACEMENT;  Surgeon: Vernie Murders, MD;  Location: [facility];  Service: ENT;  Laterality: Left;  ? TUBAL LIGATION    ? ?Family History:  ?Family History  ?Problem Relation Age of Onset  ? Lung cancer Father   ? Diabetes Mellitus II Sister   ? ?Family Psychiatric  History:  ?Social History:  ?Social History  ? ?Substance and Sexual Activity  ?Alcohol Use Never  ?   ?Social History  ? ?Substance and Sexual Activity  ?Drug Use Never  ?  ?Social History  ? ?Socioeconomic History  ? Marital status: Divorced  ?  Spouse name: Not  on file  ? Number of children: Not on file  ? Years of education: Not on file  ? Highest education level: Not on file  ?Occupational History  ? Not on file  ?Tobacco Use  ? Smoking status: Every Day  ?  Packs/day: 0.10  ?  Years: 40.00  ?  Pack years: 4.00  ?  Types: Cigarettes  ? Smokeless tobacco: Never  ? Tobacco comments:  ?  Started smoking in her "40s".  Was 1 PPD.  Now down to 1-2 cigs/day.  ?Substance and Sexual Activity  ? Alcohol use: Never  ? Drug use: Never  ? Sexual activity:  Not on file  ?Other Topics Concern  ? Not on file  ?Social History Narrative  ? Not on file  ? ?Social Determinants of Health  ? ?Financial Resource Strain: Not on file  ?Food Insecurity: Not on file  ?Transportation Needs: Not on file  ?Physical Activity: Not on file  ?Stress: Not on file  ?Social Connections: Not on file  ? ?Additional Social History: ?  ? ?Allergies:   ?Allergies  ?Allergen Reactions  ? Naproxen Swelling  ? Sulfa Antibiotics Rash  ? Codeine Itching  ? ? ?Labs:  ?Results for orders placed or performed during the hospital encounter of 11/06/21 (from the past 48 hour(s))  ?Comprehensive metabolic panel     Status: Abnormal  ? Collection Time: 11/06/21  2:55 PM  ?Result Value Ref Range  ? Sodium 143 135 - 145 mmol/L  ? Potassium 3.5 3.5 - 5.1 mmol/L  ? Chloride 106 98 - 111 mmol/L  ? CO2 25 22 - 32 mmol/L  ? Glucose, Bld 107 (H) 70 - 99 mg/dL  ?  Comment: Glucose reference range applies only to samples taken after fasting for at least 8 hours.  ? BUN 21 8 - 23 mg/dL  ? Creatinine, Ser 0.78 0.44 - 1.00 mg/dL  ? Calcium 9.6 8.9 - 10.3 mg/dL  ? Total Protein 7.7 6.5 - 8.1 g/dL  ? Albumin 3.9 3.5 - 5.0 g/dL  ? AST 30 15 - 41 U/L  ? ALT 22 0 - 44 U/L  ? Alkaline Phosphatase 72 38 - 126 U/L  ? Total Bilirubin 0.4 0.3 - 1.2 mg/dL  ? GFR, Estimated >60 >60 mL/min  ?  Comment: (NOTE) ?Calculated using the CKD-EPI Creatinine Equation (2021) ?  ? Anion gap 12 5 - 15  ?  Comment: Performed at [facility], [address] [place], [place] [address]  ?Ethanol     Status: None  ? Collection Time: 11/06/21  2:55 PM  ?Result Value Ref Range  ? Alcohol, Ethyl (B) <10 <10 mg/dL  ?  Comment: (NOTE) ?Lowest detectable limit for serum alcohol is 10 mg/dL. ? ?For medical purposes only. ?Performed at [facility], [address] [place], ?[place] [address] ?  ?Salicylate level     Status: Abnormal  ? Collection Time: 11/06/21  2:55 PM  ?Result Value Ref Range  ? Salicylate Lvl <7.0 (L) 7.0 - 30.0 mg/dL   ?  Comment: Performed at [facility], [address] [place], [place] [address]  ?Acetaminophen level     Status: Abnormal  ? Collection Time: 11/06/21  2:55 PM  ?Result Value Ref Range  ? Acetaminophen (Tylenol), Serum <10 (L) 10 - 30 ug/mL  ?  Comment: (NOTE) ?Therapeutic concentrations vary significantly. A range of 10-30 ug/mL  ?may be an effective concentration for many patients. However, some  ?are best  treated at concentrations outside of this range. ?Acetaminophen concentrations >150 ug/mL at 4 hours after ingestion  ?and >50 ug/mL at 12 hours after ingestion are often associated with  ?toxic reactions. ? ?Performed at [facility], [address] [place], ?[place] [address] ?  ?cbc     Status: Abnormal  ? Collection Time: 11/06/21  2:55 PM  ?Result Value Ref Range  ? WBC 11.0 (H) 4.0 - 10.5 K/uL  ? RBC 5.72 (H) 3.87 - 5.11 MIL/uL  ? Hemoglobin 16.1 (H) 12.0 - 15.0 g/dL  ? HCT 50.0 (H) 36.0 - 46.0 %  ? MCV 87.4 80.0 - 100.0 fL  ? MCH 28.1 26.0 - 34.0 pg  ? MCHC 32.2 30.0 - 36.0 g/dL  ? RDW 14.7 11.5 - 15.5 %  ? Platelets 291 150 - 400 K/uL  ? nRBC 0.0 0.0 - 0.2 %  ?  Comment: Performed at [facility], [address], [place], [place] [address]  ?Brain natriuretic peptide     Status: None  ? Collection Time: 11/06/21  2:55 PM  ?Result Value Ref Range  ? B Natriuretic Peptide 61.9 0.0 - 100.0 pg/mL  ?  Comment: Performed at [facility], [address], [place], [place] [address]  ?Blood gas, venous     Status: Abnormal (Preliminary result)  ? Collection Time: 11/06/21  3:29 PM  ?Result Value Ref Range  ? pH, Ven 7.4 7.25 - 7.43  ? pCO2, Ven 49 44 - 60 mmHg  ? pO2, Ven PENDING 32 - 45 mmHg  ? Bicarbonate 30.4 (H) 20.0 - 28.0 mmol/L  ? Acid-Base Excess 4.5 (H) 0.0 - 2.0 mmol/L  ? O2 Saturation 27.7 %  ? Patient temperature 37.0   ? Collection site VEIN   ?  Comment: Performed at [facility], [address] [place], [place] [address]  ?Resp Panel by RT-PCR  (Flu A&B, Covid) Nasopharyngeal Swab     Status: None  ? Collection Time: 11/06/21  8:51 PM  ? Specimen: Nasopharyngeal Swab; Nasopharyngeal(NP) swabs in vial transport medium  ?Result Value Ref Range  ? S

## 2021-11-07 NOTE — ED Notes (Signed)
Pt placed on 2L of oxygen at this time.

## 2021-11-07 NOTE — Assessment & Plan Note (Addendum)
Synthroid 

## 2021-11-07 NOTE — ED Notes (Signed)
IVC/Pending Medical Admit  

## 2021-11-08 DIAGNOSIS — J189 Pneumonia, unspecified organism: Secondary | ICD-10-CM | POA: Diagnosis not present

## 2021-11-08 LAB — CBC
HCT: 39.5 % (ref 36.0–46.0)
Hemoglobin: 13 g/dL (ref 12.0–15.0)
MCH: 28.4 pg (ref 26.0–34.0)
MCHC: 32.9 g/dL (ref 30.0–36.0)
MCV: 86.4 fL (ref 80.0–100.0)
Platelets: 228 10*3/uL (ref 150–400)
RBC: 4.57 MIL/uL (ref 3.87–5.11)
RDW: 14.8 % (ref 11.5–15.5)
WBC: 10.7 10*3/uL — ABNORMAL HIGH (ref 4.0–10.5)
nRBC: 0 % (ref 0.0–0.2)

## 2021-11-08 LAB — EXPECTORATED SPUTUM ASSESSMENT W GRAM STAIN, RFLX TO RESP C

## 2021-11-08 LAB — BASIC METABOLIC PANEL
Anion gap: 7 (ref 5–15)
BUN: 16 mg/dL (ref 8–23)
CO2: 26 mmol/L (ref 22–32)
Calcium: 8.6 mg/dL — ABNORMAL LOW (ref 8.9–10.3)
Chloride: 109 mmol/L (ref 98–111)
Creatinine, Ser: 0.62 mg/dL (ref 0.44–1.00)
GFR, Estimated: >60 mL/min (ref >60)
Glucose, Bld: 92 mg/dL (ref 70–99)
Potassium: 3.2 mmol/L — ABNORMAL LOW (ref 3.5–5.1)
Sodium: 142 mmol/L (ref 135–145)

## 2021-11-08 LAB — HIV ANTIBODY (ROUTINE TESTING W REFLEX): HIV Screen 4th Generation wRfx: NONREACTIVE

## 2021-11-08 MED ORDER — POTASSIUM CHLORIDE CRYS ER 20 MEQ PO TBCR
40.0000 meq | EXTENDED_RELEASE_TABLET | Freq: Once | ORAL | Status: AC
  Administered 2021-11-08: 40 meq via ORAL
  Filled 2021-11-08: qty 2

## 2021-11-08 MED ORDER — ENSURE ENLIVE PO LIQD
237.0000 mL | Freq: Three times a day (TID) | ORAL | Status: DC
  Administered 2021-11-08 – 2021-11-17 (×17): 237 mL via ORAL

## 2021-11-08 NOTE — Plan of Care (Signed)
  Problem: Education: Goal: Knowledge of General Education information will improve Description: Including pain rating scale, medication(s)/side effects and non-pharmacologic comfort measures Outcome: Progressing   Problem: Coping: Goal: Level of anxiety will decrease Outcome: Progressing   Problem: Pain Managment: Goal: General experience of comfort will improve Outcome: Progressing   

## 2021-11-08 NOTE — Progress Notes (Signed)
Initial Nutrition Assessment ? ?DOCUMENTATION CODES:  ? ?Obesity unspecified ? ?INTERVENTION:  ? ?Ensure Enlive po TID, each supplement provides 350 kcal and 20 grams of protein. ? ?MVI po daily  ? ?Dysphagia 3 diet  ? ?NUTRITION DIAGNOSIS:  ? ?Inadequate oral intake related to acute illness as evidenced by per patient/family report. ? ?GOAL:  ? ?Patient will meet greater than or equal to 90% of their needs ? ?MONITOR:  ? ?PO intake, Supplement acceptance, Labs, Weight trends, Skin, I & O's ? ?REASON FOR ASSESSMENT:  ? ?Consult ?Assessment of nutrition requirement/status ? ?ASSESSMENT:  ? ?81 y/o female with h/o GERD, dementia, anxiety, depression, hypothyroidism, HLD, COPD and HTN who is admitted IVC for SI and also noted to have CAP. ? ?Met with pt in room today. Sitter at bedside. Pt reports decreased oral intake for 1 month pta; pt reports that she has also lost 10lbs over the past month. Pt reports eating 100% of her breakfast this morning but reports that she did not eat lunch as she has poor dentition and was unable to chew her food. Pt reports poor oral intake at baseline; pt reports that she usually just eats two meals a day at home. Pt reports that she does drink strawberry Ensure at home. RD will add supplements and MVI to help pt meet her estimated needs. RD will also change pt to a mechanical soft diet. Per chart, pt is down 10lbs(5%) since January; this would be significant if it occurred over the past month.  ? ?Medications reviewed and include: lovenox, synthroid, MVI, protonix, miralax, prednisone, senokot, azithromycin, ceftriaxone  ? ?Labs reviewed: K 3.2(L) ?Wbc- 10.7(H) ? ?NUTRITION - FOCUSED PHYSICAL EXAM: ? ?Flowsheet Row Most Recent Value  ?Orbital Region No depletion  ?Upper Arm Region No depletion  ?Thoracic and Lumbar Region No depletion  ?Buccal Region No depletion  ?Temple Region No depletion  ?Clavicle Bone Region No depletion  ?Clavicle and Acromion Bone Region No depletion  ?Scapular  Bone Region No depletion  ?Dorsal Hand No depletion  ?Patellar Region No depletion  ?Anterior Thigh Region No depletion  ?Posterior Calf Region No depletion  ?Edema (RD Assessment) None  ?Hair Reviewed  ?Eyes Reviewed  ?Mouth Reviewed  ?Skin Reviewed  ?Nails Reviewed  ? ?Diet Order:   ?Diet Order   ? ?       ?  DIET DYS 3 Room service appropriate? Yes; Fluid consistency: Thin  Diet effective now       ?  ? ?  ?  ? ?  ? ?EDUCATION NEEDS:  ? ?Education needs have been addressed ? ?Skin:  Skin Assessment: Reviewed RN Assessment ? ?Last BM:  PTA ? ?Height:  ? ?Ht Readings from Last 1 Encounters:  ?11/07/21 5' 2"  (1.575 m)  ? ? ?Weight:  ? ?Wt Readings from Last 1 Encounters:  ?11/07/21 83.1 kg  ? ? ?Ideal Body Weight:  50 kg ? ?BMI:  Body mass index is 33.51 kg/m?. ? ?Estimated Nutritional Needs:  ? ?Kcal:  1700-1900kcal/day ? ?Protein:  85-95g/day ? ?Fluid:  1.3-1.5L/day ? ?Koleen Distance MS, RD, LDN ?Please refer to [facility]ION for RD and/or RD on-call/weekend/after hours pager ? ?

## 2021-11-08 NOTE — Progress Notes (Signed)
?PROGRESS NOTE ? ?Jackie Garcia [id number] DOB: 01-Mar-1941 DOA: 11/06/2021 ?PCP: Lorna Few, DO ? ? LOS: 2 days  ? ?Brief Narrative / Interim history: ?81 year old woman with PMH of emphysema, GERD< dementia, anxiety, hypothyroidism presented with SI with daughter.  IVC placed.  Psychiatry consulted and agree need for inpatient BH.  Also with cough, confusion, wheezing, dyspnea and admitted for CAP and COPD exacerbation.  ? ?Subjective / 24h Interval events: ?Confused. Alert, no complaints. Appears comfortable. No chest pain, no dyspnea. ? ?Assesement and Plan: ?Principal Problem: ?  Community acquired pneumonia ?Active Problems: ?  COPD exacerbation ([place]) ?  Hypothyroidism ?  Depression ?  Tobacco abuse ?  Essential hypertension ?  Dyslipidemia ? ? ?Assessment and Plan: ?Principal problem ?Community acquired pneumonia - Based on CXR, cough, production of sputum (clear). Started on rocephin and azithromycin, continue. On room air this morning, cultures negative so far.  ?  ?Active problems ?COPD exacerbation ([place]) ?Tobacco abuse - + wheezing on exam, continue steroids and nebs ?   ?Hypothyroidism - Continue synthroid ?   ?Essential hypertension - Continue norvasc ?   ?Dyslipidemia - Continue statin ?   ?Depression - IVC, pending behavioral health placement, however it seems like she will need placement since family can no longer care for her per daughter's discussion with psychiatry team.  ? ?Scheduled Meds: ? amLODipine  10 mg Oral Daily  ? enoxaparin (LOVENOX) injection  40 mg Subcutaneous Daily  ? levothyroxine  50 mcg Oral Q0600  ? mometasone-formoterol  2 puff Inhalation BID  ? multivitamin with minerals   Oral Daily  ? pantoprazole  40 mg Oral Daily  ? polyethylene glycol  17 g Oral Q breakfast  ? predniSONE  40 mg Oral Q breakfast  ? rosuvastatin  5 mg Oral Daily  ? senna  2 tablet Oral QHS  ? sertraline  100 mg Oral q morning  ? ?Continuous Infusions: ? azithromycin 500 mg (11/08/21 0204)  ?  cefTRIAXone (ROCEPHIN)  IV 2 g (11/08/21 0112)  ? ?PRN Meds:.acetaminophen **OR** acetaminophen, albuterol, haloperidol lactate, ipratropium-albuterol, LORazepam, LORazepam, magnesium hydroxide, ondansetron **OR** ondansetron (ZOFRAN) IV, traZODone ? ?Diet Orders (From admission, onward)  ? ?  Start     Ordered  ? 11/06/21 2314  Diet Heart Room service appropriate? Yes; Fluid consistency: Thin  Diet effective now       ?Question Answer Comment  ?Room service appropriate? Yes   ?Fluid consistency: Thin   ?  ? 11/06/21 2319  ? ?  ?  ? ?  ? ? ?DVT prophylaxis: enoxaparin (LOVENOX) injection 40 mg Start: 11/07/21 1200 ? ? ?Lab Results  ?Component Value Date  ? PLT 228 11/08/2021  ? ? ?  Code Status: Full Code ? ?Family Communication: no family at bedside ? ?Status is: Inpatient ? ?Remains inpatient appropriate because: antibiotics, psych issues ? ? ?Level of care: Med-Surg ? ?Consultants:  ?Psychiatry  ? ?Procedures:  ?none ? ?Microbiology  ?none ? ?Antimicrobials: ?Ceftriaxone / Azithromycin 4/20 >>  ? ? ?Objective: ?Vitals:  ? 11/07/21 2054 11/08/21 0452 11/08/21 0744 11/08/21 1500  ?BP:  135/63  (!) 128/58  ?Pulse: 73 63  73  ?Resp: 20 18  18   ?Temp:  98 ?F (36.7 ?C)  99.2 ?F (37.3 ?C)  ?TempSrc:  Oral  Oral  ?SpO2: 99% 93% 93% 93%  ?Weight:      ?Height:      ? ? ?Intake/Output Summary (Last 24 hours) at 11/08/2021 1527 ?Last  data filed at 11/08/2021 0900 ?Gross per 24 hour  ?Intake 1677.49 ml  ?Output 400 ml  ?Net 1277.49 ml  ? ?Wt Readings from Last 3 Encounters:  ?11/07/21 83.1 kg  ?06/06/21 88 kg  ?05/20/21 90.7 kg  ? ? ?Examination: ? ?Constitutional: NAD ?Eyes: no scleral icterus ?ENMT: Mucous membranes are moist.  ?Neck: normal, supple ?Respiratory: very faint end expiratory wheezing, overall distant breath sounds ?Cardiovascular: Regular rate and rhythm, no murmurs / rubs / gallops.  ?Abdomen: non distended, no tenderness. Bowel sounds positive.  ?Musculoskeletal: no clubbing / cyanosis.  ?Skin: no  rashes ?Neurologic: non focal  ? ?Data Reviewed: I have independently reviewed following labs and imaging studies ? ?CBC ?Recent Labs  ?Lab 11/06/21 ?1455 11/07/21 ?[id number] 11/08/21 ?0540  ?WBC 11.0* 8.4 10.7*  ?HGB 16.1* 14.6 13.0  ?HCT 50.0* 44.6 39.5  ?PLT 291 230 228  ?MCV 87.4 87.1 86.4  ?MCH 28.1 28.5 28.4  ?MCHC 32.2 32.7 32.9  ?RDW 14.7 14.8 14.8  ? ? ?Recent Labs  ?Lab 11/06/21 ?1455 11/07/21 ?[id number] 11/08/21 ?0540  ?NA 143 140 142  ?K 3.5 4.4 3.2*  ?CL 106 109 109  ?CO2 25 24 26   ?GLUCOSE 107* 147* 92  ?BUN 21 19 16   ?CREATININE 0.78 0.73 0.62  ?CALCIUM 9.6 8.5* 8.6*  ?AST 30  --   --   ?ALT 22  --   --   ?ALKPHOS 72  --   --   ?BILITOT 0.4  --   --   ?ALBUMIN 3.9  --   --   ?BNP 61.9  --   --   ? ? ?------------------------------------------------------------------------------------------------------------------ ?No results for input(s): CHOL, HDL, LDLCALC, TRIG, CHOLHDL, LDLDIRECT in the last 72 hours. ? ?No results found for: HGBA1C ?------------------------------------------------------------------------------------------------------------------ ?No results for input(s): TSH, T4TOTAL, T3FREE, THYROIDAB in the last 72 hours. ? ?Invalid input(s): FREET3 ? ?Cardiac Enzymes ?No results for input(s): CKMB, TROPONINI, MYOGLOBIN in the last 168 hours. ? ?Invalid input(s): CK ?------------------------------------------------------------------------------------------------------------------ ?   ?Component Value Date/Time  ? BNP 61.9 11/06/2021 1455  ? ? ?CBG: ?No results for input(s): GLUCAP in the last 168 hours. ? ?Recent Results (from the past 240 hour(s))  ?Blood culture (routine x 2)     Status: None (Preliminary result)  ? Collection Time: 11/06/21  8:51 PM  ? Specimen: BLOOD  ?Result Value Ref Range Status  ? Specimen Description BLOOD LEFT ASSIST CONTROL  Final  ? Special Requests   Final  ?  BOTTLES DRAWN AEROBIC AND ANAEROBIC Blood Culture adequate volume  ? Culture   Final  ?  NO GROWTH 2 DAYS ?Performed  at [facility], [address] [place], [place] [address] ?  ? Report Status PENDING  Incomplete  ?Resp Panel by RT-PCR (Flu A&B, Covid) Nasopharyngeal Swab     Status: None  ? Collection Time: 11/06/21  8:51 PM  ? Specimen: Nasopharyngeal Swab; Nasopharyngeal(NP) swabs in vial transport medium  ?Result Value Ref Range Status  ? SARS Coronavirus 2 by RT PCR NEGATIVE NEGATIVE Final  ?  Comment: (NOTE) ?SARS-CoV-2 target nucleic acids are NOT DETECTED. ? ?The SARS-CoV-2 RNA is generally detectable in upper respiratory ?specimens during the acute phase of infection. The lowest ?concentration of SARS-CoV-2 viral copies this assay can detect is ?138 copies/mL. A negative result does not preclude SARS-Cov-2 ?infection and should not be used as the sole basis for treatment or ?other patient management decisions. A negative result may occur with  ?improper specimen collection/handling, submission of  specimen other ?than nasopharyngeal swab, presence of viral mutation(s) within the ?areas targeted by this assay, and inadequate number of viral ?copies(<138 copies/mL). A negative result must be combined with ?clinical observations, patient history, and epidemiological ?information. The expected result is Negative. ? ?Fact Sheet for Patients:  ?EntrepreneurPulse.com.au ? ?Fact Sheet for Healthcare Providers:  ?IncredibleEmployment.be ? ?This test is no t yet approved or cleared by the [place] FDA and  ?has been authorized for detection and/or diagnosis of SARS-CoV-2 by ?FDA under an Emergency Use Authorization (EUA). This EUA will remain  ?in effect (meaning this test can be used) for the duration of the ?COVID-19 declaration under Section 564(b)(1) of the Act, 21 ?U.S.C.section 360bbb-3(b)(1), unless the authorization is terminated  ?or revoked sooner.  ? ? ?  ? Influenza A by PCR NEGATIVE NEGATIVE Final  ? Influenza B by PCR NEGATIVE NEGATIVE Final  ?  Comment: (NOTE) ?The  Xpert Xpress SARS-CoV-2/FLU/RSV plus assay is intended as an aid ?in the diagnosis of influenza from Nasopharyngeal swab specimens and ?should not be used as a sole basis for treatment. Nasal washings and ?aspirate

## 2021-11-08 NOTE — Progress Notes (Signed)
Occupational Therapy Treatment ?Patient Details ?Name: Jackie Garcia ?MRN: [id number] ?DOB: 1940/08/29 ?Today's Date: 11/08/2021 ? ? ?History of present illness ADILENE AREOLA is a 81 y.o. female with medical history significant for emphysema, GERD, dementia, anxiety and hypothyroidism, who presented to the emergency room with acute onset of suicidal ideation. The patient was IVC.  While she was here she was more confused.  She admitted to cough productive of clear sputum as well as wheezing and dyspnea.  No fever or chills.  No nausea or vomiting or abdominal pain.  No chest pain or palpitations.  No headache or dizziness or blurred vision. ?  ?OT comments ? Pt seen for brief OT tx session. Pt completed grooming tasks standing at the sink with supervision after using [facility] for mobility and using free hand to hold onto the end of the bed for stability. She sat for brief moment before returning to bed. SpO2 91% on room air at end of session. Pt reports that this has to be false and that her O2 is 80%. Double checked. Still 91%. <2min spent with pt doing therapy, no charge billed.   ? ?Recommendations for follow up therapy are one component of a multi-disciplinary discharge planning process, led by the attending physician.  Recommendations may be updated based on patient status, additional functional criteria and insurance authorization. ?   ?Follow Up Recommendations ? Home health OT  ?  ?Assistance Recommended at Discharge Intermittent Supervision/Assistance  ?Patient can return home with the following ? A little help with walking and/or transfers;A little help with bathing/dressing/bathroom;Help with stairs or ramp for entrance;Assist for transportation;Direct supervision/assist for financial management;Direct supervision/assist for medications management ?  ?Equipment Recommendations ? None recommended by OT  ?  ?Recommendations for Other Services   ? ?  ?Precautions / Restrictions Precautions ?Precautions:  Fall ?Restrictions ?Weight Bearing Restrictions: No  ? ? ?  ? ?Mobility Bed Mobility ?Overal bed mobility: Modified Independent ?  ?  ?  ?  ?  ?  ?  ?  ? ?Transfers ?Overall transfer level: Needs assistance ?Equipment used: Straight cane ?Transfers: Sit to/from Stand ?Sit to Stand: Supervision ?  ?  ?  ?  ?  ?  ?  ?  ?Balance Overall balance assessment: Mild deficits observed, not formally tested ?  ?  ?  ?  ?  ?  ?  ?  ?  ?  ?  ?  ?  ?  ?  ?  ?  ?  ?   ? ?ADL either performed or assessed with clinical judgement  ? ?ADL Overall ADL's : Needs assistance/impaired ?  ?  ?Grooming: Standing;Supervision/safety;Wash/dry hands;Wash/dry face;Oral care ?Grooming Details (indicate cue type and reason): minor set up to open toothbrush packaging ?  ?  ?  ?  ?  ?  ?  ?  ?  ?  ?  ?  ?  ?  ?  ?  ?  ? ?Extremity/Trunk Assessment   ?  ?  ?  ?  ?  ? ?Vision   ?  ?  ?Perception   ?  ?Praxis   ?  ? ?Cognition Arousal/Alertness: Awake/alert ?Behavior During Therapy: [facility] for tasks assessed/performed ?Overall Cognitive Status: Within Functional Limits for tasks assessed ?  ?  ?  ?  ?  ?  ?  ?  ?  ?  ?  ?  ?  ?  ?  ?  ?  ?  ?  ?   ?  Exercises   ? ?  ?Shoulder Instructions   ? ? ?  ?General Comments SpO2 91% on room air at end of session. Pt reports that this has to be false and that her O2 is 80%. Double checked. Still 91%.  ? ? ?Pertinent Vitals/ Pain       Pain Assessment ?Pain Assessment: No/denies pain ? ?Home Living   ?  ?  ?  ?  ?  ?  ?  ?  ?  ?  ?  ?  ?  ?  ?  ?  ?  ?  ? ?  ?Prior Functioning/Environment    ?  ?  ?  ?   ? ?Frequency ? Min 2X/week  ? ? ? ? ?  ?Progress Toward Goals ? ?OT Goals(current goals can now be found in the care plan section) ? Progress towards OT goals: Progressing toward goals ? ?Acute Rehab OT Goals ?Patient Stated Goal: to rest ?OT Goal Formulation: With patient ?Time For Goal Achievement: 11/21/21 ?Potential to Achieve Goals: Fair  ?Plan Discharge plan remains appropriate;Frequency remains appropriate    ? ?Co-evaluation ? ? ?   ?  ?  ?  ?  ? ?  ?AM-PAC OT "6 Clicks" Daily Activity     ?Outcome Measure ? ? Help from another person eating meals?: None ?Help from another person taking care of personal grooming?: A Little ?Help from another person toileting, which includes using toliet, bedpan, or urinal?: A Little ?Help from another person bathing (including washing, rinsing, drying)?: A Little ?Help from another person to put on and taking off regular upper body clothing?: None ?Help from another person to put on and taking off regular lower body clothing?: A Little ?6 Click Score: 20 ? ?  ?End of Session   ? ?OT Visit Diagnosis: Unsteadiness on feet (R26.81);Muscle weakness (generalized) (M62.81) ?  ?Activity Tolerance Patient tolerated treatment well ?  ?Patient Left in bed;with call bell/phone within reach;with nursing/sitter in room ?  ?Nurse Communication   ?  ? ?   ? ?Time: 7824-2353 (OT left pt room to grab ice for pt, only therapy therefore no charge entered.) ?OT Time Calculation (min): 9 min ? ?Charges: OT General Charges ?$OT Visit: 1 Visit ? ?Arman Filter., MPH, MS, OTR/L ?ascom [phone] ?11/08/21, 4:12 PM ? ?

## 2021-11-08 NOTE — TOC Progression Note (Signed)
Transition of Care (TOC) - Progression Note  ? ? ?Patient Details  ?Name: Jackie Garcia ?MRN: [id number] ?Date of Birth: 1941/01/04 ? ?Transition of Care (TOC) CM/SW Contact  ?Truddie Hidden, RN ?Phone Number: ?11/08/2021, 3:43 PM ? ?Clinical Narrative:    ?Patient declined for geropsych. Spoke with daughter, [name]. [name] stated patient could not return home. Patient receives $1800 a month via pension and social security. Family previously tried placement but stated form (FL2) was not completed. [name] will follow up with TOC with information for potential placement information.  ? ? ?  ?  ? ?Expected Discharge Plan and Services ?  ?  ?  ?  ?  ?                ?  ?  ?  ?  ?  ?  ?  ?  ?  ?  ? ? ?Social Determinants of Health (SDOH) Interventions ?  ? ?Readmission Risk Interventions ?   ? View : No data to display.  ?  ?  ?  ? ? ?

## 2021-11-08 NOTE — Progress Notes (Signed)
Physical Therapy Treatment ?Patient Details ?Name: Jackie Garcia ?MRN: [id number] ?DOB: January 22, 1941 ?Today's Date: 11/08/2021 ? ? ?History of Present Illness Jackie Garcia is a 81 y.o. female with medical history significant for emphysema, GERD, dementia, anxiety and hypothyroidism, who presented to the emergency room with acute onset of suicidal ideation. The patient was IVC.  While she was here she was more confused.  She admitted to cough productive of clear sputum as well as wheezing and dyspnea.  No fever or chills.  No nausea or vomiting or abdominal pain.  No chest pain or palpitations.  No headache or dizziness or blurred vision. ? ?  ?PT Comments  ? ? Pt received sleeping in bed. Pt agitated stating "you people never give me a break" but initiates sitting EoB indicating agreeable to participate minimally.  Pt frequently stating she can not walk far as she will "pass out", with clarification from PT on asking pt an appropriate distance to prevent that, pt unable to specify becoming more agitated. Focus of session on attempting ambulation with SPC as at eval pt ambulated with RW and pt uses SPC at baseline. Pt able to stand with supervision and ambulate with minguard within room with correct SPC sequencing and no unsteadiness appreciated. Pt returning to supine in bed quickly non-verbally indicating she is likely done participating in PT.  Noted increased WOB but SPO2 at 90-91% on RA. Education provided on PLB to assist in seated recovery of SOB pt verbalizing understanding. Pt remains appropriate for [facility] PT as it appears pt is at baseline function ambulating short distances safely with SPC. Will benefit from [facility] PT at d/c due to current poor endurance. ?  ?Recommendations for follow up therapy are one component of a multi-disciplinary discharge planning process, led by the attending physician.  Recommendations may be updated based on patient status, additional functional criteria and insurance  authorization. ? ?Follow Up Recommendations ? Home health PT ?  ?  ?Assistance Recommended at Discharge Intermittent Supervision/Assistance  ?Patient can return home with the following A little help with bathing/dressing/bathroom;Help with stairs or ramp for entrance;Assistance with cooking/housework;Assist for transportation ?  ?Equipment Recommendations ? Rolling walker (2 wheels)  ?  ?Recommendations for Other Services   ? ? ?  ?Precautions / Restrictions Precautions ?Precautions: Fall ?Restrictions ?Weight Bearing Restrictions: No  ?  ? ?Mobility ? Bed Mobility ?Overal bed mobility: Modified Independent ?  ?  ?  ?  ?  ?  ?  ?Patient Response: Cooperative ? ?Transfers ?Overall transfer level: Needs assistance ?Equipment used: None ?Transfers: Sit to/from Stand ?Sit to Stand: Supervision ?  ?  ?  ?  ?  ?  ?  ? ?Ambulation/Gait ?Ambulation/Gait assistance: Min guard ?Gait Distance (Feet): 20 Feet ?Assistive device: Straight cane ?Gait Pattern/deviations: Step-through pattern, Decreased step length - right, Decreased step length - left ?  ?  ?  ?General Gait Details: Good understanding and sequencing of SPC. No LOB noted throughout ? ? ?Stairs ?  ?  ?  ?  ?  ? ? ?Wheelchair Mobility ?  ? ?Modified Rankin (Stroke Patients Only) ?  ? ? ?  ?Balance Overall balance assessment: Mild deficits observed, not formally tested ?  ?  ?  ?  ?  ?  ?  ?  ?  ?  ?  ?  ?  ?  ?  ?  ?  ?  ?  ? ?  ?Cognition Arousal/Alertness: Awake/alert ?Behavior During Therapy: [facility] for tasks assessed/performed ?  Overall Cognitive Status: Within Functional Limits for tasks assessed ?  ?  ?  ?  ?  ?  ?  ?  ?  ?  ?  ?  ?  ?  ?  ?  ?General Comments: disgruntled working with PT ?  ?  ? ?  ?Exercises Other Exercises ?Other Exercises: Education on PLB ? ?  ?General Comments General comments (skin integrity, edema, etc.): SPO2 90-91% on RA post mobility ?  ?  ? ?Pertinent Vitals/Pain Pain Assessment ?Pain Assessment: No/denies pain  ? ? ?Home Living   ?  ?   ?  ?  ?  ?  ?  ?  ?  ?   ?  ?Prior Function    ?  ?  ?   ? ?PT Goals (current goals can now be found in the care plan section) Acute Rehab PT Goals ?Patient Stated Goal: to go to [place] ?PT Goal Formulation: With patient ?Time For Goal Achievement: 11/21/21 ?Potential to Achieve Goals: Fair ?Progress towards PT goals: Progressing toward goals ? ?  ?Frequency ? ? ? Min 2X/week ? ? ? ?  ?PT Plan Current plan remains appropriate  ? ? ?Co-evaluation   ?  ?  ?  ?  ? ?  ?AM-PAC PT "6 Clicks" Mobility   ?Outcome Measure ? Help needed turning from your back to your side while in a flat bed without using bedrails?: None ?Help needed moving from lying on your back to sitting on the side of a flat bed without using bedrails?: None ?Help needed moving to and from a bed to a chair (including a wheelchair)?: A Little ?Help needed standing up from a chair using your arms (e.g., wheelchair or bedside chair)?: A Little ?Help needed to walk in hospital room?: A Little ?Help needed climbing 3-5 steps with a railing? : A Little ?6 Click Score: 20 ? ?  ?End of Session Equipment Utilized During Treatment: Gait belt ?Activity Tolerance: Patient tolerated treatment well ?Patient left: in bed ?Nurse Communication: Mobility status ?PT Visit Diagnosis: Muscle weakness (generalized) (M62.81) ?  ? ? ?Time: 1660-6301 ?PT Time Calculation (min) (ACUTE ONLY): 6 min ? ?Charges:             ?          ? ?Delphia Grates. Fairly IV, PT, DPT ?Physical Therapist-   ?[facility]  ?11/08/2021, 3:38 PM ? ?

## 2021-11-08 NOTE — Care Management Important Message (Signed)
Important Message ? ?Patient Details  ?Name: Jackie Garcia ?MRN: [id number] ?Date of Birth: Apr 26, 1941 ? ? ?Medicare Important Message Given:  N/A - LOS <3 / Initial given by admissions ? ? ? ? ?[name] ?11/08/2021, 2:25 PM ?

## 2021-11-09 DIAGNOSIS — I10 Essential (primary) hypertension: Secondary | ICD-10-CM

## 2021-11-09 LAB — MAGNESIUM: Magnesium: 2 mg/dL (ref 1.7–2.4)

## 2021-11-09 LAB — BASIC METABOLIC PANEL
Anion gap: 6 (ref 5–15)
BUN: 15 mg/dL (ref 8–23)
CO2: 27 mmol/L (ref 22–32)
Calcium: 8.5 mg/dL — ABNORMAL LOW (ref 8.9–10.3)
Chloride: 108 mmol/L (ref 98–111)
Creatinine, Ser: 0.61 mg/dL (ref 0.44–1.00)
GFR, Estimated: >60 mL/min (ref >60)
Glucose, Bld: 89 mg/dL (ref 70–99)
Potassium: 3.5 mmol/L (ref 3.5–5.1)
Sodium: 141 mmol/L (ref 135–145)

## 2021-11-09 MED ORDER — CEFUROXIME AXETIL 500 MG PO TABS
500.0000 mg | ORAL_TABLET | Freq: Two times a day (BID) | ORAL | Status: AC
  Administered 2021-11-10 – 2021-11-11 (×4): 500 mg via ORAL
  Filled 2021-11-09 (×4): qty 1

## 2021-11-09 MED ORDER — POTASSIUM CHLORIDE 20 MEQ PO PACK
40.0000 meq | PACK | Freq: Once | ORAL | Status: AC
  Administered 2021-11-09: 40 meq via ORAL
  Filled 2021-11-09: qty 2

## 2021-11-09 MED ORDER — AZITHROMYCIN 250 MG PO TABS
500.0000 mg | ORAL_TABLET | Freq: Every day | ORAL | Status: AC
  Administered 2021-11-10 – 2021-11-11 (×2): 500 mg via ORAL
  Filled 2021-11-09 (×2): qty 2

## 2021-11-09 MED ORDER — OXYCODONE-ACETAMINOPHEN 5-325 MG PO TABS
1.0000 | ORAL_TABLET | Freq: Four times a day (QID) | ORAL | Status: DC | PRN
  Administered 2021-11-09 – 2021-11-10 (×3): 1 via ORAL
  Filled 2021-11-09 (×3): qty 1

## 2021-11-09 NOTE — Assessment & Plan Note (Signed)
Advised to quit.  

## 2021-11-09 NOTE — Plan of Care (Signed)
  Problem: Activity: Goal: Ability to tolerate increased activity will improve Outcome: Progressing   Problem: Clinical Measurements: Goal: Ability to maintain a body temperature in the normal range will improve Outcome: Progressing   Problem: Respiratory: Goal: Ability to maintain adequate ventilation will improve Outcome: Progressing   

## 2021-11-09 NOTE — Progress Notes (Signed)
PHARMACIST - PHYSICIAN COMMUNICATION ? ?CONCERNING: Antibiotic IV to Oral Route Change Policy ? ?RECOMMENDATION: ?This patient is receiving azithromycin by the intravenous route.  Based on criteria approved by the Pharmacy and Therapeutics Committee, the antibiotic(s) is/are being converted to the equivalent oral dose form(s). ? ? ?DESCRIPTION: ?These criteria include: ?Patient being treated for a respiratory tract infection, urinary tract infection, cellulitis or clostridium difficile associated diarrhea if on metronidazole ?The patient is not neutropenic and does not exhibit a GI malabsorption state ?The patient is eating (either orally or via tube) and/or has been taking other orally administered medications for a least 24 hours ?The patient is improving clinically and has a Tmax < 100.5 ? ?If you have questions about this conversion, please contact the Pharmacy Department  ? ?Nitzia Perren B Aniyah Nobis  ?11/09/21  ?  ?

## 2021-11-09 NOTE — Progress Notes (Signed)
?  Progress Note ? ? ?Patient: Jackie Garcia ZJQ:[id number] DOB: Feb 02, 1941 DOA: 11/06/2021     3 ?DOS: the patient was seen and examined on 11/09/2021 ?  ?Brief hospital course: ?81 year old woman with PMH of emphysema, GERD< dementia, anxiety, hypothyroidism presented with SI with daughter.  IVC placed.  Psychiatry consulted and agree need for inpatient BH.  Also with cough, confusion, wheezing, dyspnea and admitted for CAP and COPD exacerbation.  ? ?Assessment and Plan: ?* Community acquired pneumonia ?Blood cultures so far negative, will continue finishing up 5 days of IV antibiotics. ? ?COPD exacerbation (HCC) ?Condition much improved, continue steroid taper. ? ?Depression ?Patient is evaluated by psychiatry, and has a behavioral abnormality, initially accepted to [facility] psych unit, then denied it. ?Family is not able to take patient home, currently looking for placement in group home called above and beyond. ? ?Hypothyroidism ?Synthroid. ? ?Dyslipidemia ?Continue statin. ? ?Essential hypertension ?Norvasc. ? ?Tobacco abuse ?Advised to quit. ? ? ? ? ?  ? ?Subjective:  ?Patient currently does not have any complaints.  She states that she has been short of breath which is baseline. ? ?Physical Exam: ?Vitals:  ? 11/08/21 1500 11/08/21 2028 11/09/21 0317 11/09/21 0808  ?BP: (!) 128/58 (!) 120/53 (!) 129/92 (!) 110/96  ?Pulse: 73 76 68 65  ?Resp: 18 18 18 18   ?Temp: 99.2 ?F (37.3 ?C) 98.6 ?F (37 ?C) 98.1 ?F (36.7 ?C) 98.3 ?F (36.8 ?C)  ?TempSrc: Oral Oral Oral Oral  ?SpO2: 93% 94% 96% 93%  ?Weight:      ?Height:      ? ?General exam: Appears calm and comfortable  ?Respiratory system: Clear to auscultation. Respiratory effort normal. ?Cardiovascular system: S1 & S2 heard, RRR. No JVD, murmurs, rubs, gallops or clicks. No pedal edema. ?Gastrointestinal system: Abdomen is nondistended, soft and nontender. No organomegaly or masses felt. Normal bowel sounds heard. ?Central nervous system: Alert and oriented x2. No focal  neurological deficits. ?Extremities: Symmetric 5 x 5 power. ?Skin: No rashes, lesions or ulcers ?Psychiatry: Judgement and insight appear normal. Mood & affect appropriate.  ? ?Data Reviewed: ? ?Chest x-ray and lab results reviewed ? ?Family Communication: Not able to reach daughter ? ?Disposition: ?Status is: Inpatient ?Remains inpatient appropriate because: unsafe discharge ? Planned Discharge Destination:  group home ? ? ? ?Time spent: 32 minutes ? ?Author: ? , MD ?11/09/2021 1:23 PM ? ?For on call review [email or web address].  ?

## 2021-11-09 NOTE — TOC Progression Note (Addendum)
Transition of Care (TOC) - Progression Note  ? ? ?Patient Details  ?Name: Jackie Garcia ?MRN: [id number] ?Date of Birth: 06/11/1941 ? ?Transition of Care (TOC) CM/SW Contact  ?Gildardo Griffes, LCSW ?Phone Number: ?11/09/2021, 7:43 AM ? ?Clinical Narrative:    ? ?Update: [name] with [facility] will come today to assess patient's appropriateness for family care home. RN and MD updated. ? ? ? ?Patient's daughter [name] called back and reports she was working on placement for patient at [facility] called [facility].  ? ?CSW called [name] at [facility] at [phone]. ? ?[name] confirms they were reviewing patient's case to see if they can admit her, states she will need to follow up with her supervisor on an update. [name] will call CSW back once she consults with supervisor.  ? ? ? ?  ?  ? ?Expected Discharge Plan and Services ?  ?  ?  ?  ?  ?                ?  ?  ?  ?  ?  ?  ?  ?  ?  ?  ? ? ?Social Determinants of Health (SDOH) Interventions ?  ? ?Readmission Risk Interventions ?   ? View : No data to display.  ?  ?  ?  ? ? ?

## 2021-11-09 NOTE — Hospital Course (Addendum)
81 year old woman with PMH of emphysema, GERD< dementia, anxiety, hypothyroidism presented with SI with daughter.  IVC placed.  Psychiatry consulted and agree need for inpatient BH.  Also with cough, confusion, wheezing, dyspnea and admitted for CAP and COPD exacerbation.  ?Condition has improved, currently pending placement.  ?Physical therapy/Occupational Therapy recommended home with home care.   ? ?

## 2021-11-10 DIAGNOSIS — F3289 Other specified depressive episodes: Secondary | ICD-10-CM

## 2021-11-10 MED ORDER — PREDNISONE 20 MG PO TABS
20.0000 mg | ORAL_TABLET | Freq: Every day | ORAL | Status: AC
  Administered 2021-11-11 – 2021-11-12 (×2): 20 mg via ORAL
  Filled 2021-11-10 (×2): qty 1

## 2021-11-10 MED ORDER — PREDNISONE 20 MG PO TABS: 20.0000 mg | ORAL_TABLET | Freq: Every day | ORAL | Status: DC

## 2021-11-10 NOTE — Plan of Care (Signed)

## 2021-11-10 NOTE — Progress Notes (Signed)
?  Progress Note ? ? ?Patient: Jackie Garcia KCM:[id number] DOB: Mar 15, 1941 DOA: 11/06/2021     4 ?DOS: the patient was seen and examined on 11/10/2021 ?  ?Brief hospital course: ?81 year old woman with PMH of emphysema, GERD< dementia, anxiety, hypothyroidism presented with SI with daughter.  IVC placed.  Psychiatry consulted and agree need for inpatient BH.  Also with cough, confusion, wheezing, dyspnea and admitted for CAP and COPD exacerbation.  ? ?Assessment and Plan: ?* Community acquired pneumonia ?Condition improved, antibiotic changed to oral, for total course of 5 days.  Currently pending for placement ? ?COPD exacerbation (HCC) ?Patient no longer has any bronchospasm, will reduce prednisone to 20 mg daily can quickly wean that off ? ?Depression ?Patient is evaluated by psychiatry, and has a behavioral abnormality, initially accepted to [facility] psych unit, then denied it. ?Family is not able to take patient home, currently looking for placement in group home called [facility]. ? ?Hypothyroidism ?Synthroid. ? ?Dyslipidemia ?Continue statin. ? ?Essential hypertension ?Norvasc. ? ?Tobacco abuse ?Advised to quit. ? ? ? ? ?  ? ?Subjective:  ?Patient doing well, currently has no complaints. ? ?Physical Exam: ?Vitals:  ? 11/09/21 1615 11/09/21 1951 11/10/21 0505 11/10/21 0569  ?BP: (!) 115/48 (!) 121/53 105/65 122/64  ?Pulse: 73 62 77 66  ?Resp: 18 18 18    ?Temp: 98.9 ?F (37.2 ?C) 98.5 ?F (36.9 ?C) 98.1 ?F (36.7 ?C)   ?TempSrc: Oral Oral Oral   ?SpO2: 93% 95% 94%   ?Weight:      ?Height:      ? ?General exam: Appears calm and comfortable  ?Respiratory system: Clear to auscultation. Respiratory effort normal. ?Cardiovascular system: S1 & S2 heard, RRR. No JVD, murmurs, rubs, gallops or clicks. No pedal edema. ?Gastrointestinal system: Abdomen is nondistended, soft and nontender. No organomegaly or masses felt. Normal bowel sounds heard. ?Central nervous system: Alert and oriented. No focal neurological  deficits. ?Extremities: Symmetric 5 x 5 power. ?Skin: No rashes, lesions or ulcers ?Psychiatry: Judgement and insight appear normal. Mood & affect appropriate.  ? ?Data Reviewed: ? ?There are no new results to review at this time. ? ?Family Communication:  ? ?Disposition: ?Status is: Inpatient ?Remains inpatient appropriate because: Unsafe discharge ? Planned Discharge Destination:  Group home ? ? ? ?Time spent: 25 minutes ? ?Author: ? , MD ?11/10/2021 12:48 PM ? ?For on call review [email or web address].  ?

## 2021-11-11 DIAGNOSIS — F320 Major depressive disorder, single episode, mild: Secondary | ICD-10-CM

## 2021-11-11 LAB — CULTURE, BLOOD (ROUTINE X 2)
Culture: NO GROWTH
Culture: NO GROWTH
Special Requests: ADEQUATE

## 2021-11-11 LAB — GLUCOSE, CAPILLARY: Glucose-Capillary: 102 mg/dL — ABNORMAL HIGH (ref 70–99)

## 2021-11-11 MED ORDER — TUBERCULIN PPD 5 UNIT/0.1ML ID SOLN
5.0000 [IU] | Freq: Once | INTRADERMAL | Status: AC
  Administered 2021-11-11: 5 [IU] via INTRADERMAL
  Filled 2021-11-11: qty 0.1

## 2021-11-11 NOTE — Progress Notes (Signed)
Called to pt room to speak with family - Granddaughter [name] at bedside - [name], pts daughter, is Stacey's mother.  [name] stated that she did not want pt to be placed at facility - requested to speak with Case Management.  [name] stated that she is the medical POA and will provide papers from attorney.  [name] said that [place] spent all of pts money and she will not have a down payment for facility.  Case management notified.  Stacey's phone number is [phone]. ?

## 2021-11-11 NOTE — TOC Progression Note (Signed)
Transition of Care (TOC) - Progression Note  ? ? ?Patient Details  ?Name: Jackie Garcia ?MRN: [id number] ?Date of Birth: Oct 06, 1940 ? ?Transition of Care (TOC) CM/SW Contact  ?Chapman Fitch, RN ?Phone Number: ?11/11/2021, 3:12 PM ? ?Clinical Narrative:    ? ? ?Spoke with [name] at [facility].  ?She confirms they can accept patient pending daughter completing paper work and payment.  ? ?VM left for daughter x2 ? ?[name] request PPD to be placed.  ?Note about covid vaccines being given, and updated fl2 at time of discharge ? ?MD to order ppd  ?  ?  ? ?Expected Discharge Plan and Services ?  ?  ?  ?  ?  ?                ?  ?  ?  ?  ?  ?  ?  ?  ?  ?  ? ? ?Social Determinants of Health (SDOH) Interventions ?  ? ?Readmission Risk Interventions ?   ? View : No data to display.  ?  ?  ?  ? ? ?
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
## 2021-11-11 NOTE — TOC Progression Note (Signed)
Transition of Care (TOC) - Progression Note  ? ? ?Patient Details  ?Name: Jackie Garcia ?MRN: [id number] ?Date of Birth: [birth year] ? ?Transition of Care (TOC) CM/SW Contact  ?Chapman Fitch, RN ?Phone Number: ?11/11/2021, 3:14 PM ? ?Clinical Narrative:    ? ?PPD placed at 1430 ? ?  ?  ? ?Expected Discharge Plan and Services ?  ?  ?  ?  ?  ?                ?  ?  ?  ?  ?  ?  ?  ?  ?  ?  ? ? ?Social Determinants of Health (SDOH) Interventions ?  ? ?Readmission Risk Interventions ?   ? View : No data to display.  ?  ?  ?  ? ? ?

## 2021-11-11 NOTE — Progress Notes (Signed)
?  Progress Note ? ? ?Patient: Jackie Garcia [id number] DOB: 04-11-1941 DOA: 11/06/2021     5 ?DOS: the patient was seen and examined on 11/11/2021 ?  ?Brief hospital course: ?81 year old woman with PMH of emphysema, GERD< dementia, anxiety, hypothyroidism presented with SI with daughter.  IVC placed.  Psychiatry consulted and agree need for inpatient BH.  Also with cough, confusion, wheezing, dyspnea and admitted for CAP and COPD exacerbation.  ? ?Assessment and Plan: ?* Community acquired pneumonia ?Condition improved, antibiotic changed to oral, for total course of 5 days.  ?I reviewed patient chest x-ray on 4/19, there is some asymmetric infiltrates indicating possibility of pneumonia.  No evidence of tuberculosis. ?We will place a PPD per request from facility. ? ?COPD exacerbation ([place]) ?Condition much improved, will change prednisone to 10 mg tomorrow. ? ?Depression ?Continue current treatment, patient is pending for placement into the facility "[facility]". ? ?Hypothyroidism ?Synthroid. ? ?Dyslipidemia ?Continue statin. ? ?Essential hypertension ?Norvasc. ? ?Tobacco abuse ?Advised to quit. ? ? ? ? ?  ? ?Subjective:  ?Patient doing well today, baseline confusion.  No short of breath. ? ?Physical Exam: ?Vitals:  ? 11/10/21 2022 11/11/21 0410 11/11/21 0750 11/11/21 1200  ?BP: (!) 129/59 (!) 142/66 132/71 136/60  ?Pulse: 65 (!) 58  70  ?Resp: 17 18 18 18   ?Temp: 98.5 ?F (36.9 ?C) 98 ?F (36.7 ?C) 98 ?F (36.7 ?C) 98 ?F (36.7 ?C)  ?TempSrc:   Oral Oral  ?SpO2: 93% 100% 98% 97%  ?Weight:      ?Height:      ? ?General exam: Appears calm and comfortable  ?Respiratory system: Clear to auscultation. Respiratory effort normal. ?Cardiovascular system: S1 & S2 heard, RRR. No JVD, murmurs, rubs, gallops or clicks. No pedal edema. ?Gastrointestinal system: Abdomen is nondistended, soft and nontender. No organomegaly or masses felt. Normal bowel sounds heard. ?Central nervous system: Alert and oriented x2. No focal  neurological deficits. ?Extremities: Symmetric 5 x 5 power. ?Skin: No rashes, lesions or ulcers ? ? ?Data Reviewed: ? ?There are no new results to review at this time. ? ?Family Communication: daughter updated ? ?Disposition: ?Status is: Inpatient ?Remains inpatient appropriate because: unsafe discharge ? Planned Discharge Destination:  group home ? ? ? ?Time spent: 25 minutes ? ?Author: ?Sharen Hones, MD ?11/11/2021 1:26 PM ? ?For on call review [email or web address].  ?

## 2021-11-11 NOTE — Progress Notes (Signed)
Occupational Therapy Treatment ?Patient Details ?Name: Jackie Garcia ?MRN: [id number] ?DOB: Feb 03, 1941 ?Today's Date: 11/11/2021 ? ? ?History of present illness Jackie Garcia is a 81 y.o. female with medical history significant for emphysema, GERD, dementia, anxiety and hypothyroidism, who presented to the emergency room with acute onset of suicidal ideation. The patient was IVC.  While she was here she was more confused.  She admitted to cough productive of clear sputum as well as wheezing and dyspnea.  No fever or chills.  No nausea or vomiting or abdominal pain.  No chest pain or palpitations.  No headache or dizziness or blurred vision. ?  ?OT comments ? Pt seen for brief OT tx. Pt received in bed, agreeable to OT. Pt completed bed mobility with mod indep and declined gait belt use by OT for mobility in room. Pt used SPC in one hand and used other UE to reach out for the closet/footboard of the bed/countertop. She switched hands coming back to the bed after grooming. Pt given 1 cue to help her open her toothbrush and with increased time/effort opens the toothpaste despite complaints that she can't do it. SBA to complete grooming tasks at the sink with UE support. MIN A to assist with pulling hair up into a pony tail. Pt endorsing feeling SOB but doesn't rate. VSS throughout on RA. Pt progressing, continues to benefit from skilled OT services to maximize safety/indep.   ? ?Recommendations for follow up therapy are one component of a multi-disciplinary discharge planning process, led by the attending physician.  Recommendations may be updated based on patient status, additional functional criteria and insurance authorization. ?   ?Follow Up Recommendations ? Home health OT  ?  ?Assistance Recommended at Discharge Intermittent Supervision/Assistance  ?Patient can return home with the following ? A little help with walking and/or transfers;A little help with bathing/dressing/bathroom;Help with stairs or ramp  for entrance;Assist for transportation;Direct supervision/assist for financial management;Direct supervision/assist for medications management ?  ?Equipment Recommendations ? None recommended by OT  ?  ?Recommendations for Other Services   ? ?  ?Precautions / Restrictions Precautions ?Precautions: Fall ?Restrictions ?Weight Bearing Restrictions: No  ? ? ?  ? ?Mobility Bed Mobility ?Overal bed mobility: Modified Independent ?  ?  ?  ?  ?  ?  ?  ?  ? ?Transfers ?Overall transfer level: Needs assistance ?Equipment used: Straight cane ?Transfers: Sit to/from Stand ?Sit to Stand: Supervision ?  ?  ?  ?  ?  ?General transfer comment: pt adamantly declines gait belt or assist to stand, requiring increased time/effort to complete ?  ?  ?Balance Overall balance assessment: Mild deficits observed, not formally tested ?  ?  ?  ?  ?  ?  ?  ?  ?  ?  ?  ?  ?  ?  ?  ?  ?  ?  ?   ? ?ADL either performed or assessed with clinical judgement  ? ?ADL Overall ADL's : Needs assistance/impaired ?  ?  ?Grooming: Standing;Supervision/safety;Wash/dry hands;Wash/dry face;Oral care ?Grooming Details (indicate cue type and reason): Pt given 1 cue to help her open her toothbrush and with increased time/effort opens the toothpaste despite complaints that she can't do it. SBA to complete grooming tasks at the sink with UE support. MIN A to assist with pulling hair up into a pony tail. ?  ?  ?  ?  ?  ?  ?  ?  ?  ?  ?  ?  ?  ?  ?  ?  ?  ? ?  Extremity/Trunk Assessment   ?  ?  ?  ?  ?  ? ?Vision   ?  ?  ?Perception   ?  ?Praxis   ?  ? ?Cognition Arousal/Alertness: Awake/alert ?Behavior During Therapy: [facility] for tasks assessed/performed ?Overall Cognitive Status: Within Functional Limits for tasks assessed ?  ?  ?  ?  ?  ?  ?  ?  ?  ?  ?  ?  ?  ?  ?  ?  ?  ?  ?  ?   ?Exercises   ? ?  ?Shoulder Instructions   ? ? ?  ?General Comments SPo2 at 92% post session.  ? ? ?Pertinent Vitals/ Pain       Pain Assessment ?Pain Assessment: No/denies pain ? ?Home Living    ?  ?  ?  ?  ?  ?  ?  ?  ?  ?  ?  ?  ?  ?  ?  ?  ?  ?  ? ?  ?Prior Functioning/Environment    ?  ?  ?  ?   ? ?Frequency ? Min 2X/week  ? ? ? ? ?  ?Progress Toward Goals ? ?OT Goals(current goals can now be found in the care plan section) ? Progress towards OT goals: Progressing toward goals ? ?Acute Rehab OT Goals ?Patient Stated Goal: to rest ?OT Goal Formulation: With patient ?Time For Goal Achievement: 11/21/21 ?Potential to Achieve Goals: Fair  ?Plan Discharge plan remains appropriate;Frequency remains appropriate   ? ?Co-evaluation ? ? ?   ?  ?  ?  ?  ? ?  ?AM-PAC OT "6 Clicks" Daily Activity     ?Outcome Measure ? ? Help from another person eating meals?: None ?Help from another person taking care of personal grooming?: A Little ?Help from another person toileting, which includes using toliet, bedpan, or urinal?: A Little ?Help from another person bathing (including washing, rinsing, drying)?: A Little ?Help from another person to put on and taking off regular upper body clothing?: None ?Help from another person to put on and taking off regular lower body clothing?: A Little ?6 Click Score: 20 ? ?  ?End of Session   ? ?OT Visit Diagnosis: Unsteadiness on feet (R26.81);Muscle weakness (generalized) (M62.81) ?  ?Activity Tolerance Patient tolerated treatment well ?  ?Patient Left in bed;with call bell/phone within reach;with nursing/sitter in room ?  ?Nurse Communication   ?  ? ?   ? ?Time: 8563-1497 ?OT Time Calculation (min): 9 min ? ?Charges: OT General Charges ?$OT Visit: 1 Visit ?OT Treatments ?$Self Care/Home Management : 8-22 mins ? ?Arman Filter., MPH, MS, OTR/L ?ascom [phone] ?11/11/21, 2:22 PM ? ?

## 2021-11-11 NOTE — Progress Notes (Signed)
Physical Therapy Treatment ?Patient Details ?Name: Jackie Garcia ?MRN: [id number] ?DOB: 11-Nov-1940 ?Today's Date: 11/11/2021 ? ? ?History of Present Illness Jackie Garcia is a 81 y.o. female with medical history significant for emphysema, GERD, dementia, anxiety and hypothyroidism, who presented to the emergency room with acute onset of suicidal ideation. The patient was IVC.  While she was here she was more confused.  She admitted to cough productive of clear sputum as well as wheezing and dyspnea.  No fever or chills.  No nausea or vomiting or abdominal pain.  No chest pain or palpitations.  No headache or dizziness or blurred vision. ? ?  ?PT Comments  ? ? Pt received side lying in bed, Agreeable to PT session with encouragement, Pt remains slightly disgruntled to participate. Remains mod-I for bed mobility and standing with supervision to [facility]. Pt performing 2 x 12' bouts of gait with SPC in room with seated rest b/t bouts. Pt progressing in gait tolerance with seated rest b/t bouts with education on benefits of PLB to assist in recovery from SOB. SPO2 at 90-92% on RA throughout OOB activity. Pt performing safe and correct use of SPC with step through gait pattern returning to supine in bed with all needs in reach and sitter in room. D/c recs remain appropriate, will continue to benefit from PT in home setting to improve strength/endurance with walking tasks. ?   ?Recommendations for follow up therapy are one component of a multi-disciplinary discharge planning process, led by the attending physician.  Recommendations may be updated based on patient status, additional functional criteria and insurance authorization. ? ?Follow Up Recommendations ? Home health PT ?  ?  ?Assistance Recommended at Discharge Intermittent Supervision/Assistance  ?Patient can return home with the following A little help with bathing/dressing/bathroom;Help with stairs or ramp for entrance;Assistance with cooking/housework;Assist  for transportation ?  ?Equipment Recommendations ? Rolling walker (2 wheels)  ?  ?Recommendations for Other Services   ? ? ?  ?Precautions / Restrictions Precautions ?Precautions: Fall ?Restrictions ?Weight Bearing Restrictions: No  ?  ? ?Mobility ? Bed Mobility ?Overal bed mobility: Modified Independent ?  ?  ?  ?  ?  ?  ?  ?Patient Response: Cooperative ? ?Transfers ?Overall transfer level: Needs assistance ?Equipment used: Straight cane ?Transfers: Sit to/from Stand ?Sit to Stand: Supervision ?  ?  ?  ?  ?  ?  ?  ? ?Ambulation/Gait ?Ambulation/Gait assistance: Min guard ?Gait Distance (Feet): 25 Feet ?Assistive device: Straight cane ?Gait Pattern/deviations: Step-through pattern, Decreased step length - right, Decreased step length - left ?  ?  ?  ?General Gait Details: Correct sequencing and use of SPC. Intermittent need for LUE on counter top for added balance. ? ? ?Stairs ?  ?  ?  ?  ?  ? ? ?Wheelchair Mobility ?  ? ?Modified Rankin (Stroke Patients Only) ?  ? ? ?  ?Balance Overall balance assessment: Mild deficits observed, not formally tested ?  ?  ?  ?  ?  ?  ?  ?  ?  ?  ?  ?  ?  ?  ?  ?  ?  ?  ?  ? ?  ?Cognition Arousal/Alertness: Awake/alert ?Behavior During Therapy: [facility] for tasks assessed/performed ?Overall Cognitive Status: Within Functional Limits for tasks assessed ?  ?  ?  ?  ?  ?  ?  ?  ?  ?  ?  ?  ?  ?  ?  ?  ?  General Comments: Willing to participate but still not pleased to perform OOB activity. ?  ?  ? ?  ?Exercises Other Exercises ?Other Exercises: PLB technique to assist in improving SOB ? ?  ?General Comments General comments (skin integrity, edema, etc.): SPo2 at 92% post session. ?  ?  ? ?Pertinent Vitals/Pain Pain Assessment ?Pain Assessment: No/denies pain  ? ? ?Home Living   ?  ?  ?  ?  ?  ?  ?  ?  ?  ?   ?  ?Prior Function    ?  ?  ?   ? ?PT Goals (current goals can now be found in the care plan section) Acute Rehab PT Goals ?Patient Stated Goal: to go to [place] ?PT Goal Formulation:  With patient ?Time For Goal Achievement: 11/21/21 ?Potential to Achieve Goals: Fair ?Progress towards PT goals: Progressing toward goals ? ?  ?Frequency ? ? ? Min 2X/week ? ? ? ?  ?PT Plan Current plan remains appropriate  ? ? ?Co-evaluation   ?  ?  ?  ?  ? ?  ?AM-PAC PT "6 Clicks" Mobility   ?Outcome Measure ? Help needed turning from your back to your side while in a flat bed without using bedrails?: None ?Help needed moving from lying on your back to sitting on the side of a flat bed without using bedrails?: None ?Help needed moving to and from a bed to a chair (including a wheelchair)?: A Little ?Help needed standing up from a chair using your arms (e.g., wheelchair or bedside chair)?: A Little ?Help needed to walk in hospital room?: A Little ?Help needed climbing 3-5 steps with a railing? : A Little ?6 Click Score: 20 ? ?  ?End of Session Equipment Utilized During Treatment: Gait belt ?Activity Tolerance: Patient tolerated treatment well ?Patient left: in bed;with nursing/sitter in room ?Nurse Communication: Mobility status ?PT Visit Diagnosis: Muscle weakness (generalized) (M62.81) ?  ? ? ?Time: 7782-4235 ?PT Time Calculation (min) (ACUTE ONLY): 9 min ? ?Charges:  $Therapeutic Exercise: 8-22 mins          ?          ?Delphia Grates. Fairly IV, PT, DPT ?Physical Therapist- [place]  ?[facility]  ?11/11/2021, 2:06 PM ? ?

## 2021-11-12 DIAGNOSIS — F320 Major depressive disorder, single episode, mild: Secondary | ICD-10-CM

## 2021-11-12 LAB — BLOOD GAS, VENOUS
Acid-Base Excess: 4.5 mmol/L — ABNORMAL HIGH (ref 0.0–2.0)
Bicarbonate: 30.4 mmol/L — ABNORMAL HIGH (ref 20.0–28.0)
O2 Saturation: 27.7 %
Patient temperature: 37
pCO2, Ven: 49 mmHg (ref 44–60)
pH, Ven: 7.4 (ref 7.25–7.43)

## 2021-11-12 NOTE — Progress Notes (Signed)
?  Progress Note ? ? ?Patient: Jackie Garcia GGE:[id number] DOB: 07-Nov-1940 DOA: 11/06/2021     6 ?DOS: the patient was seen and examined on 11/12/2021 ?  ?Brief hospital course: ?81 year old woman with PMH of emphysema, GERD< dementia, anxiety, hypothyroidism presented with SI with daughter.  IVC placed.  Psychiatry consulted and agree need for inpatient BH.  Also with cough, confusion, wheezing, dyspnea and admitted for CAP and COPD exacerbation.  ?Condition has improved, currently pending placement. ? ?Assessment and Plan: ?* Community acquired pneumonia ?Condition improved, antibiotic completed. ?I reviewed patient chest x-ray on 4/19, there is some asymmetric infiltrates indicating possibility of pneumonia.  No evidence of tuberculosis. ?A PPD  test was placed on 4/24 per request from the facility. ? ?COPD exacerbation (HCC) ?Condition has improved, steroids will be discontinued today. ? ?Depression ?Patient was evaluated by psychiatry, not able to take patient to [facility] psych due to history of dementia.  Initially planned for transfer to a group home named "[facility]". ?Spoke with case management, now the granddaughter is willing to take patient home, but she will not able to take her until Friday. ? ?Hypothyroidism ?Synthroid. ? ?Dyslipidemia ?Continue statin. ? ?Essential hypertension ?Norvasc. ? ?Tobacco abuse ?Advised to quit. ? ? ? ? ?  ? ?Subjective:  ?Patient does not have any complaint today.  No short of breath or cough. ? ?Physical Exam: ?Vitals:  ? 11/11/21 1200 11/11/21 1538 11/11/21 1937 11/12/21 0825  ?BP: 136/60 117/60 (!) 117/55 137/69  ?Pulse: 70 68 63 65  ?Resp: 18 16 17 16   ?Temp: 98 ?F (36.7 ?C) 97.8 ?F (36.6 ?C) 98 ?F (36.7 ?C) 98.8 ?F (37.1 ?C)  ?TempSrc: Oral  Oral Oral  ?SpO2: 97% 96% 94% (!) 89%  ?Weight:      ?Height:      ? ?General exam: Appears calm and comfortable  ?Respiratory system: Decreased breathing sounds. Respiratory effort normal. ?Cardiovascular system: S1 & S2  heard, RRR. No JVD, murmurs, rubs, gallops or clicks. No pedal edema. ?Gastrointestinal system: Abdomen is nondistended, soft and nontender. No organomegaly or masses felt. Normal bowel sounds heard. ?Central nervous system: Alert and oriented. No focal neurological deficits. ?Extremities: Symmetric 5 x 5 power. ?Skin: No rashes, lesions or ulcers ?Psychiatry: Judgement and insight appear normal. Mood & affect appropriate.  ? ?Data Reviewed: ? ?No new test results ? ?Family Communication:  ? ?Disposition: ?Status is: Inpatient ?Remains inpatient appropriate because: Unsafe discharge. ? Planned Discharge Destination:  Likely granddaughters home ? ? ? ?Time spent: 27 minutes ? ?Author: ? , MD ?11/12/2021 11:15 AM ? ?For on call review [email or web address].  ?

## 2021-11-12 NOTE — Care Management Important Message (Signed)
Important Message ? ?Patient Details  ?Name: Jackie Garcia ?MRN: [id number] ?Date of Birth: [birth year] ? ? ?Medicare Important Message Given:  Yes ? ?Patient asleep upon time of visit with no family in room.  Copy of Medicare IM left on windowsill for reference.   ? ? ?[name] ?11/12/2021, 11:03 AM ?

## 2021-11-12 NOTE — TOC Progression Note (Addendum)
Transition of Care (TOC) - Progression Note  ? ? ?Patient Details  ?Name: Jackie Garcia ?MRN: [id number] ?Date of Birth: 1941-03-07 ? ?Transition of Care (TOC) CM/SW Contact  ?Chapman Fitch, RN ?Phone Number: ?11/12/2021, 2:41 PM ? ?Clinical Narrative:    ? ?Spoke with Theatre stage manager.  She states that she is Cytogeneticist, but does not have a copy of the paper work. ? ?[name] states that her grandmother has lived with her in the past.  Plan is now for patient to discharge to [facility].  She states that she works from home and will be able to provide care.  She states the room currently does not have any furniture and it would be Saturday before the room would be ready for patient to discharge to.  TOC supervisor and MD updated.  ? ?Confirmed with daughter [name] that she is in agreement of the plan.  ? ?Patient will be discharging to [address] [place].  ? ?Granddaughter and daughter agreeable to home health services.  States they do not have a preference of agency.  Referral made and accepted by [facility] with Frances [facility]urbish.  ? ?Order for hospital bed.  Will give referral to [facility] closer to discharge  ?  ?  ? ?Expected Discharge Plan and Services ?  ?  ?  ?  ?  ?                ?  ?  ?  ?  ?  ?  ?  ?  ?  ?  ? ? ?Social Determinants of Health (SDOH) Interventions ?  ? ?Readmission Risk Interventions ?   ? View : No data to display.  ?  ?  ?  ? ? ?

## 2021-11-12 NOTE — Progress Notes (Signed)
Mobility Specialist - Progress Note ? ? 11/12/21 1200  ?Mobility  ?Activity Refused mobility  ? ? ? ?2nd attempt this date. Pt still sleeping on arrival, awakened to voice. Pt declined mobility at this time, no reason specified. Will attempt another date/time.  ? ? ?Filiberto Pinks ?Mobility Specialist ?11/12/21, 12:20 PM ? ?

## 2021-11-13 DIAGNOSIS — J441 Chronic obstructive pulmonary disease with (acute) exacerbation: Secondary | ICD-10-CM | POA: Diagnosis not present

## 2021-11-13 DIAGNOSIS — J189 Pneumonia, unspecified organism: Secondary | ICD-10-CM

## 2021-11-13 NOTE — Progress Notes (Signed)
?  Progress Note ? ? ?Patient: Jackie Garcia [id number] DOB: 27-Nov-1940 DOA: 11/06/2021     7 ?DOS: the patient was seen and examined on 11/13/2021 ?  ?Brief hospital course: ?81 year old woman with PMH of emphysema, GERD< dementia, anxiety, hypothyroidism presented with SI with daughter.  IVC placed.  Psychiatry consulted and agree need for inpatient BH.  Also with cough, confusion, wheezing, dyspnea and admitted for CAP and COPD exacerbation.  ?Condition has improved, currently pending placement.  ?Granddaughter agreed to take patient home on Friday. ? ? ?Assessment and Plan: ?* Community acquired pneumonia ?Condition improved, antibiotics completed. ? ?COPD exacerbation (HCC) ?Condition improved, steroids discontinued ? ?Depression ?Patient was evaluated by psychiatry, not able to take patient to [facility] psych due to history of dementia.   ?Family could not take patient home until Friday.  Currently patient is medically stable. ? ? ?Hypothyroidism ?Synthroid. ? ?Dyslipidemia ?Continue statin. ? ?Essential hypertension ?Norvasc. ? ?Tobacco abuse ?Advised to quit. ? ? ? ? ?  ? ?Subjective:  ?Patient doing well, no new issues ? ?Physical Exam: ?Vitals:  ? 11/12/21 0825 11/12/21 1517 11/12/21 2015 11/13/21 0743  ?BP: 137/69 (!) 146/75 112/70 130/75  ?Pulse: 65 74 (!) 57 (!) 48  ?Resp: 16 16 18 16   ?Temp: 98.8 ?F (37.1 ?C) 98.8 ?F (37.1 ?C) 98.2 ?F (36.8 ?C) 97.8 ?F (36.6 ?C)  ?TempSrc: Oral Oral  Oral  ?SpO2: (!) 89% 91% 96% 95%  ?Weight:      ?Height:      ? ?General exam: Appears calm and comfortable  ?Respiratory system: Clear to auscultation. Respiratory effort normal. ?Cardiovascular system: S1 & S2 heard, RRR. No JVD, murmurs, rubs, gallops or clicks. No pedal edema. ?Gastrointestinal system: Abdomen is nondistended, soft and nontender. No organomegaly or masses felt. Normal bowel sounds heard. ?Central nervous system: Alert and oriented x2. No focal neurological deficits. ?Extremities: Symmetric 5 x 5  power. ?Skin: No rashes, lesions or ulcers ?Psychiatry: Judgement and insight appear normal. Mood & affect appropriate.  ? ?Data Reviewed: ? ?There are no new results to review at this time. ? ?Family Communication:  ? ?Disposition: ?Status is: Inpatient ?Remains inpatient appropriate because: Unsafe discharge. ? Planned Discharge Destination: Home with Home Health ? ? ? ?Time spent: 23 minutes ? ?Author: ? , MD ?11/13/2021 10:58 AM ? ?For on call review [email or web address].  ?

## 2021-11-13 NOTE — Progress Notes (Signed)
PT Cancellation Note ? ?Patient Details ?Name: Jackie Garcia ?MRN: [id number] ?DOB: May 31, 1941 ? ? ?Cancelled Treatment:     PT attempt. Pt  refused. Max encouragement to get OOB but pt remained unwilling. Planning to DC on Friday. Pt has been self limiting throughout admission. Does have dementia. Acute PT will continue to follow per current POC progressing as able.   ? ? ?Rushie Chestnut ?11/13/2021, 3:50 PM ?

## 2021-11-13 NOTE — Progress Notes (Signed)
OT Cancellation Note ? ?Patient Details ?Name: Jackie Garcia ?MRN: [id number] ?DOB: May 11, 1941 ? ? ?Cancelled Treatment:    Reason Eval/Treat Not Completed: Patient declined, no reason specified. Upon attempt, pt declining OT treatment despite education in benefits and encouragement. Pt stating "I just don't feel like it." Will re-attempt at later date/time as pt is agreeable.  ? ?Arman Filter., MPH, MS, OTR/L ?ascom [phone] ?11/13/21, 11:12 AM ?

## 2021-11-13 NOTE — Plan of Care (Signed)
  Problem: Activity: Goal: Ability to tolerate increased activity will improve Outcome: Progressing   Problem: Clinical Measurements: Goal: Ability to maintain a body temperature in the normal range will improve Outcome: Progressing   Problem: Respiratory: Goal: Ability to maintain adequate ventilation will improve Outcome: Progressing Goal: Ability to maintain a clear airway will improve Outcome: Progressing   Problem: Education: Goal: Knowledge of General Education information will improve Description: Including pain rating scale, medication(s)/side effects and non-pharmacologic comfort measures Outcome: Progressing   Problem: Health Behavior/Discharge Planning: Goal: Ability to manage health-related needs will improve Outcome: Progressing   

## 2021-11-13 NOTE — TOC Progression Note (Signed)
Transition of Care (TOC) - Progression Note  ? ? ?Patient Details  ?Name: [name] ?MRN: [id number] ?Date of Birth: 1940-08-15 ? ?Transition of Care (TOC) CM/SW Contact  ?Chapman Fitch, RN ?Phone Number: ?11/13/2021, 9:59 AM ? ?Clinical Narrative:    ? ? ?Referral made to [facility] with [facility] for hospital bed  ?  ?  ? ?Expected Discharge Plan and Services ?  ?  ?  ?  ?  ?                ?  ?  ?  ?  ?  ?  ?  ?  ?  ?  ? ? ?Social Determinants of Health (SDOH) Interventions ?  ? ?Readmission Risk Interventions ?   ? View : No data to display.  ?  ?  ?  ? ? ?

## 2021-11-13 NOTE — Progress Notes (Signed)
Nutrition Follow Up Note  ? ?DOCUMENTATION CODES:  ? ?Obesity unspecified ? ?INTERVENTION:  ? ?Ensure Enlive po TID, each supplement provides 350 kcal and 20 grams of protein. ? ?MVI po daily  ? ?Dysphagia 3 diet  ? ?NUTRITION DIAGNOSIS:  ? ?Inadequate oral intake related to acute illness as evidenced by per patient/family report. ? ?GOAL:  ? ?Patient will meet greater than or equal to 90% of their needs ?-met  ? ?MONITOR:  ? ?PO intake, Supplement acceptance, Labs, Weight trends, Skin, I & O's ? ?ASSESSMENT:  ? ?81 y/o female with h/o GERD, dementia, anxiety, depression, hypothyroidism, HLD, COPD and HTN who is admitted IVC for SI and also noted to have CAP. ? ?Pt continues to have good appetite and oral intake in hospital; pt eating 80-100% of meals and is drinking Ensure supplements. No new weight since 4/20; will request weekly weights.  ? ?Medications reviewed and include: lovenox, synthroid, MVI, protonix, miralax, senokot ? ?Labs reviewed: K 3.5 wnl, Mg 2.0 wnl- 4/22 ? ?Diet Order:   ?Diet Order   ? ?       ?  DIET DYS 3 Room service appropriate? Yes; Fluid consistency: Thin  Diet effective now       ?  ? ?  ?  ? ?  ? ?EDUCATION NEEDS:  ? ?Education needs have been addressed ? ?Skin:  Skin Assessment: Reviewed RN Assessment ? ?Last BM:  4/24 ? ?Height:  ? ?Ht Readings from Last 1 Encounters:  ?11/07/21 5' 2"  (1.575 m)  ? ? ?Weight:  ? ?Wt Readings from Last 1 Encounters:  ?11/07/21 83.1 kg  ? ? ?Ideal Body Weight:  50 kg ? ?BMI:  Body mass index is 33.51 kg/m?. ? ?Estimated Nutritional Needs:  ? ?Kcal:  1700-1900kcal/day ? ?Protein:  85-95g/day ? ?Fluid:  1.3-1.5L/day ? ?Koleen Distance MS, RD, LDN ?Please refer to [facility] for RD and/or RD on-call/weekend/after hours pager ? ?

## 2021-11-13 NOTE — Plan of Care (Signed)
?  Problem: Activity: ?Goal: Ability to tolerate increased activity will improve ?11/13/2021 0932 by Ellis Parents, RN ?Outcome: Progressing ?11/13/2021 0930 by Ellis Parents, RN ?Outcome: Progressing ?  ?Problem: Clinical Measurements: ?Goal: Ability to maintain a body temperature in the normal range will improve ?11/13/2021 0932 by Ellis Parents, RN ?Outcome: Progressing ?11/13/2021 0930 by Ellis Parents, RN ?Outcome: Progressing ?  ?Problem: Respiratory: ?Goal: Ability to maintain adequate ventilation will improve ?11/13/2021 0932 by Ellis Parents, RN ?Outcome: Progressing ?11/13/2021 0930 by Ellis Parents, RN ?Outcome: Progressing ?Goal: Ability to maintain a clear airway will improve ?11/13/2021 0932 by Ellis Parents, RN ?Outcome: Progressing ?11/13/2021 0930 by Ellis Parents, RN ?Outcome: Progressing ?  ?

## 2021-11-14 DIAGNOSIS — J189 Pneumonia, unspecified organism: Secondary | ICD-10-CM | POA: Diagnosis not present

## 2021-11-14 DIAGNOSIS — J441 Chronic obstructive pulmonary disease with (acute) exacerbation: Secondary | ICD-10-CM | POA: Diagnosis not present

## 2021-11-14 DIAGNOSIS — F320 Major depressive disorder, single episode, mild: Secondary | ICD-10-CM | POA: Diagnosis not present

## 2021-11-14 NOTE — TOC Progression Note (Signed)
Transition of Care (TOC) - Progression Note  ? ? ?Patient Details  ?Name: Jackie Garcia ?MRN: [id number] ?Date of Birth: 1941/04/17 ? ?Transition of Care (TOC) CM/SW Contact  ?Chapman Fitch, RN ?Phone Number: ?11/14/2021, 2:37 PM ? ?Clinical Narrative:    ? ?Message left for granddaughter [name] to follow up and determine if everything is still on track for patient to be discharge to her house on Saturday ? ?Message sent to [facility] with [facility] for update on hospital bed  ? ?  ?  ? ?Expected Discharge Plan and Services ?  ?  ?  ?  ?  ?                ?  ?  ?  ?  ?  ?  ?  ?  ?  ?  ? ? ?Social Determinants of Health (SDOH) Interventions ?  ? ?Readmission Risk Interventions ?   ? View : No data to display.  ?  ?  ?  ? ? ?

## 2021-11-14 NOTE — Progress Notes (Signed)
?  Progress Note ? ? ?Patient: Jackie Garcia HFS:[id number] DOB: 02-Dec-1940 DOA: 11/06/2021     8 ?DOS: the patient was seen and examined on 11/14/2021 ?  ?Brief hospital course: ?81 year old woman with PMH of emphysema, GERD< dementia, anxiety, hypothyroidism presented with SI with daughter.  IVC placed.  Psychiatry consulted and agree need for inpatient BH.  Also with cough, confusion, wheezing, dyspnea and admitted for CAP and COPD exacerbation.  ?Condition has improved, currently pending placement.  ?Granddaughter agreed to take patient home on Friday. ? ? ?Assessment and Plan: ?* Community acquired pneumonia ?Completed antibiotics. ? ?COPD exacerbation (HCC) ?Completed steroids, condition resolved ? ?Depression ?Patient was evaluated by psychiatry, not able to take patient to [facility] psych due to history of dementia.   ?Family could not take patient home until Friday.  Patient doing well. ? ? ?Hypothyroidism ?Synthroid. ? ?Dyslipidemia ?Continue statin. ? ?Essential hypertension ?Norvasc.  Blood pressure controlled. ? ?Tobacco abuse ?Advised to quit. ? ? ? ? ?  ? ?Subjective:  ?Patient doing well, slept well.  Denies any short of breath. ? ?Physical Exam: ?Vitals:  ? 11/13/21 1537 11/13/21 2038 11/14/21 0524 11/14/21 0900  ?BP: 131/65 117/60 129/65 138/74  ?Pulse: (!) 57 (!) 54 (!) 54 (!) 58  ?Resp: 18 18 18 18   ?Temp: 98.7 ?F (37.1 ?C) 97.7 ?F (36.5 ?C) 97.9 ?F (36.6 ?C) 97.9 ?F (36.6 ?C)  ?TempSrc: Oral Oral  Oral  ?SpO2: 93% 91% 92% 100%  ?Weight:      ?Height:      ? ?General exam: Appears calm and comfortable  ?Respiratory system: Clear to auscultation. Respiratory effort normal. ?Cardiovascular system: S1 & S2 heard, RRR. No JVD, murmurs, rubs, gallops or clicks. No pedal edema. ?Gastrointestinal system: Abdomen is nondistended, soft and nontender. No organomegaly or masses felt. Normal bowel sounds heard. ?Central nervous system: Alert and oriented x2.  No focal neurological deficits. ?Extremities: Symmetric  5 x 5 power. ?Skin: No rashes, lesions or ulcers ?Psychiatry: Judgement and insight appear normal. Mood & affect appropriate.  ? ?Data Reviewed: ? ?There are no new results to review at this time. ? ?Family Communication:  ? ?Disposition: ?Status is: Inpatient ?Remains inpatient appropriate because: Unsafe discharge ? Planned Discharge Destination: Home ? ? ? ?Time spent: 16 minutes ? ?Author: , MD ?11/14/2021 11:06 AM ? ?For on call review [email or web address].  ?

## 2021-11-14 NOTE — Progress Notes (Signed)
PT Cancellation Note ? ?Patient Details ?Name: Jackie Garcia ?MRN: [id number] ?DOB: 1941/07/19 ? ? ?Cancelled Treatment:     Pt continues to be resistive to OOB activity/PT. Max encouragement but pt remained unwilling. Will return at 1130 am to try to encourage pt to perform OOB. Pt's dementia,self limiting behavior and overall lack of insight on current situation have slowed the progress towards PT goals.   ? ? ?Willette Pa ?11/14/2021, 10:08 AM ?

## 2021-11-14 NOTE — Progress Notes (Signed)
Patient has Dementia which requires head to be positioned in ways not feasible with a normal bed. Head must be elevated at least 40 degrees or risk for aspiration  ?

## 2021-11-14 NOTE — Progress Notes (Signed)
OT Cancellation Note ? ?Patient Details ?Name: Jackie Garcia ?MRN: [id number] ?DOB: Jul 21, 1941 ? ? ?Cancelled Treatment:    Reason Eval/Treat Not Completed: Patient declined, no reason specified. Pt washing her hands at the sink independently after toileting, SPC propped up against the wall. Pt returns to EOB and promptly returns to supine after fixing her linens in standing with no LOB. Adamantly declines OT tx at this time. Pt offered a comb for her hair which was provided. Will re-attempt at later date/time as pt is agreeable.  ? ?Ardeth Perfect., MPH, MS, OTR/L ?ascom [phone] ?11/14/21, 3:01 PM ? ? ?

## 2021-11-14 NOTE — Progress Notes (Signed)
Physical Therapy Treatment ?Patient Details ?Name: Jackie Garcia ?MRN: [id number] ?DOB: 04/13/1941 ?Today's Date: 11/14/2021 ? ? ?History of Present Illness Jackie Garcia is a 81 y.o. female with medical history significant for emphysema, GERD, dementia, anxiety and hypothyroidism, who presented to the emergency room with acute onset of suicidal ideation. The patient was IVC.  While she was here she was more confused.  She admitted to cough productive of clear sputum as well as wheezing and dyspnea.  No fever or chills.  No nausea or vomiting or abdominal pain.  No chest pain or palpitations.  No headache or dizziness or blurred vision. ? ?  ?PT Comments  ? ? Pt was supine in bed. Unwilling to participate at first however once encouraged she gets slightly agitated and said,"What do you need me to do."She was able to safely get OOB, stand, and ambulate with RW and with SPC. No LOB or safety concern. Cognition greatly impacts session overall. Do recommend DC home with HHPT to follow. She will benefit from continued skilled PT at home to address deficits while maximizing independence with ADLs.  ?  ?Recommendations for follow up therapy are one component of a multi-disciplinary discharge planning process, led by the attending physician.  Recommendations may be updated based on patient status, additional functional criteria and insurance authorization. ? ?Follow Up Recommendations ? Home health PT ?  ?  ?Assistance Recommended at Discharge Intermittent Supervision/Assistance  ?Patient can return home with the following A little help with bathing/dressing/bathroom;Help with stairs or ramp for entrance;Assistance with cooking/housework;Assist for transportation ?  ?Equipment Recommendations ? Rolling walker (2 wheels)  ?  ?   ?Precautions / Restrictions Precautions ?Precautions: Fall ?Restrictions ?Weight Bearing Restrictions: No  ?  ? ?Mobility ? Bed Mobility ?Overal bed mobility: Modified Independent ?  ?  Transfers ?Overall transfer level: Needs assistance ?Equipment used: Rolling walker (2 wheels), Straight cane ?Transfers: Sit to/from Stand ?Sit to Stand: Supervision ?  ?  ?  ?  ?  ?General transfer comment: no physical assistance or VCs required to stand from EOB ?  ? ?Ambulation/Gait ?Ambulation/Gait assistance: Supervision ?Gait Distance (Feet): 100 Feet ?Assistive device: Straight cane, Rolling walker (2 wheels) ?Gait Pattern/deviations: WFL(Within Functional Limits) ?Gait velocity: decreased ?  ?  ?General Gait Details: pt was easily and safely able to ambulate with RW then [facility]. no LOB or safety concern ? ?  ?Balance Overall balance assessment: Modified Independent ?  ?   ?Cognition Arousal/Alertness: Awake/alert ?Behavior During Therapy: Agitated ?Overall Cognitive Status: History of cognitive impairments - at baseline ?  ?   ?General Comments: Pt was resistive to OOB activity however with max encouragement, pt gets agitated and agrees to walk. ?  ?  ? ?  ?   ?   ? ?Pertinent Vitals/Pain Pain Assessment ?Pain Assessment: No/denies pain  ? ? ? ?PT Goals (current goals can now be found in the care plan section) Acute Rehab PT Goals ?Patient Stated Goal: none stated ?Progress towards PT goals: Progressing toward goals ? ?  ?Frequency ? ? ? Min 2X/week ? ? ? ?  ?PT Plan Current plan remains appropriate  ? ? ?   ?AM-PAC PT "6 Clicks" Mobility   ?Outcome Measure ? Help needed turning from your back to your side while in a flat bed without using bedrails?: None ?Help needed moving from lying on your back to sitting on the side of a flat bed without using bedrails?: None ?Help needed moving to and from a  bed to a chair (including a wheelchair)?: A Little ?Help needed standing up from a chair using your arms (e.g., wheelchair or bedside chair)?: A Little ?Help needed to walk in hospital room?: A Little ?Help needed climbing 3-5 steps with a railing? : A Little ?6 Click Score: 20 ? ?  ?End of Session   ?Activity  Tolerance: Patient tolerated treatment well ?Patient left: in bed;with call bell/phone within reach;with bed alarm set ?Nurse Communication: Mobility status ?PT Visit Diagnosis: Muscle weakness (generalized) (M62.81) ?  ? ? ?Time: 1191-4782 ?PT Time Calculation (min) (ACUTE ONLY): 12 min ? ?Charges:  $Gait Training: 8-22 mins          ?          ? ?Jetta Lout PTA ?11/14/21, 11:43 AM  ? ?

## 2021-11-14 NOTE — TOC Progression Note (Signed)
Transition of Care (TOC) - Progression Note  ? ? ?Patient Details  ?Name: RHYTHM GUBBELS ?MRN: [id number] ?Date of Birth: 20-Apr-1941 ? ?Transition of Care (TOC) CM/SW Contact  ?Chapman Fitch, RN ?Phone Number: ?11/14/2021, 3:52 PM ? ?Clinical Narrative:    ?Received return call from granddaughter.  She state she will now not be ready for patient to discharge to her home till Wednesday.  ? ?MD has determined that patient does not meet inpatient criteria.   ?Discharge order in place  ?[name] to file appeal ?Completed DND and and [place].  Copy emailed to [place]  ? ? ?  ?  ? ?Expected Discharge Plan and Services ?  ?  ?  ?  ?  ?Expected Discharge Date: 11/14/21               ?  ?  ?  ?  ?  ?  ?  ?  ?  ?  ? ? ?Social Determinants of Health (SDOH) Interventions ?  ? ?Readmission Risk Interventions ?   ? View : No data to display.  ?  ?  ?  ? ? ?

## 2021-11-15 DIAGNOSIS — J189 Pneumonia, unspecified organism: Secondary | ICD-10-CM | POA: Diagnosis not present

## 2021-11-15 DIAGNOSIS — J441 Chronic obstructive pulmonary disease with (acute) exacerbation: Secondary | ICD-10-CM | POA: Diagnosis not present

## 2021-11-15 DIAGNOSIS — F320 Major depressive disorder, single episode, mild: Secondary | ICD-10-CM | POA: Diagnosis not present

## 2021-11-15 LAB — GLUCOSE, CAPILLARY: Glucose-Capillary: 91 mg/dL (ref 70–99)

## 2021-11-15 NOTE — Plan of Care (Signed)
?  Problem: Activity: ?Goal: Ability to tolerate increased activity will improve ?Outcome: Progressing ?  ?Problem: Clinical Measurements: ?Goal: Ability to maintain a body temperature in the normal range will improve ?Outcome: Progressing ?  ?Problem: Respiratory: ?Goal: Ability to maintain adequate ventilation will improve ?Outcome: Progressing ?Goal: Ability to maintain a clear airway will improve ?Outcome: Progressing ?  ?Problem: Education: ?Goal: Knowledge of General Education information will improve ?Description: Including pain rating scale, medication(s)/side effects and non-pharmacologic comfort measures ?Outcome: Progressing ?  ?Problem: Health Behavior/Discharge Planning: ?Goal: Ability to manage health-related needs will improve ?Outcome: Progressing ?  ?Problem: Clinical Measurements: ?Goal: Ability to maintain clinical measurements within normal limits will improve ?Outcome: Progressing ?Goal: Will remain free from infection ?Outcome: Progressing ?Goal: Diagnostic test results will improve ?Outcome: Progressing ?Goal: Respiratory complications will improve ?Outcome: Progressing ?Goal: Cardiovascular complication will be avoided ?Outcome: Progressing ?  ?Problem: Activity: ?Goal: Risk for activity intolerance will decrease ?Outcome: Progressing ?  ?Problem: Nutrition: ?Goal: Adequate nutrition will be maintained ?Outcome: Progressing ?  ?Problem: Coping: ?Goal: Level of anxiety will decrease ?Outcome: Progressing ?  ?Problem: Elimination: ?Goal: Will not experience complications related to bowel motility ?Outcome: Progressing ?Goal: Will not experience complications related to urinary retention ?Outcome: Progressing ?  ?Problem: Pain Managment: ?Goal: General experience of comfort will improve ?Outcome: Progressing ?  ?Problem: Safety: ?Goal: Ability to remain free from injury will improve ?Outcome: Progressing ?  ?Problem: Skin Integrity: ?Goal: Risk for impaired skin integrity will decrease ?Outcome:  Progressing ?  ?Problem: Education: ?Goal: Knowledge of warning signs, risks, and behaviors that relate to suicide ideation and self-harm behaviors will improve ?Outcome: Progressing ?  ?Problem: Health Behavior/Discharge (Transition) Planning: ?Goal: Ability to manage health-related needs will improve ?Outcome: Progressing ?  ?Problem: Clinical Measurements: ?Goal: Remain free from any harm during hospitalization ?Outcome: Progressing ?  ?Problem: Nutrition: ?Goal: Adequate fluids and nutrition will be maintained ?Outcome: Progressing ?  ?Problem: Coping: ?Goal: Ability to disclose and discuss thoughts of suicide and self-harm will improve ?Outcome: Progressing ?  ?Problem: Medication Management: ?Goal: Adhere to prescribed medication regimen ?Outcome: Progressing ?  ?Problem: Sleep Hygiene: ?Goal: Ability to obtain adequate restful sleep will improve ?Outcome: Progressing ?  ?Problem: Self Esteem: ?Goal: Ability to verbalize positive feeling about self will improve ?Outcome: Progressing ?  ?

## 2021-11-15 NOTE — Progress Notes (Signed)
?  Progress Note ? ? ?Patient: Jackie Garcia [id number] DOB: Jul 18, 1941 DOA: 11/06/2021     9 ?DOS: the patient was seen and examined on 11/15/2021 ?  ?Brief hospital course: ?81 year old woman with PMH of emphysema, GERD< dementia, anxiety, hypothyroidism presented with SI with daughter.  IVC placed.  Psychiatry consulted and agree need for inpatient BH.  Also with cough, confusion, wheezing, dyspnea and admitted for CAP and COPD exacerbation.  ?Condition has improved, currently pending placement.  ?Granddaughter agreed to take patient home on Friday. ? ? ?Assessment and Plan: ?* Community acquired pneumonia ?Completed antibiotics. ? ?COPD exacerbation (HCC) ?Completed steroids, condition resolved ? ?Depression ?Patient was evaluated by psychiatry, not able to take patient to [facility] psych due to history of dementia.   ?Patient currently is medically stable to be discharged, physical therapy evaluate the patient, does not need nursing home placement.  Granddaughter indicated that she will take patient home.  At this point, she is medically stable to be discharged.  Family has decided to appeal the discharge.  We will keep patient here until the appeal process is completed ? ? ?Hypothyroidism ?Synthroid. ? ?Dyslipidemia ?Continue statin. ? ?Essential hypertension ?Norvasc.  Blood pressure controlled. ? ?Tobacco abuse ?Advised to quit. ? ? ? ? ?  ? ?Subjective:  ?Patient doing well, she has no complaints. ? ?Physical Exam: ?Vitals:  ? 11/14/21 0900 11/14/21 1621 11/14/21 2043 11/15/21 0413  ?BP: 138/74 128/69 138/71 (!) 142/66  ?Pulse: (!) 58 64 62 (!) 58  ?Resp: 18 19 18 18   ?Temp: 97.9 ?F (36.6 ?C) 98.3 ?F (36.8 ?C) 98.2 ?F (36.8 ?C) 98.5 ?F (36.9 ?C)  ?TempSrc: Oral Oral    ?SpO2: 100% 95% 95% 95%  ?Weight:      ?Height:      ? ?General exam: Appears calm and comfortable  ?Respiratory system: Clear to auscultation. Respiratory effort normal. ?Cardiovascular system: S1 & S2 heard, RRR. No JVD, murmurs, rubs,  gallops or clicks. No pedal edema. ?Gastrointestinal system: Abdomen is nondistended, soft and nontender. No organomegaly or masses felt. Normal bowel sounds heard. ?Central nervous system: Alert and oriented x2. No focal neurological deficits. ?Extremities: Symmetric 5 x 5 power. ?Skin: No rashes, lesions or ulcers ?  ? ?Data Reviewed: ? ?There are no new results to review at this time. ? ?Family Communication:  ? ?Disposition: ?Status is: Inpatient ?Remains inpatient appropriate because: Unsafe discharge ? Planned Discharge Destination: Home with Home Health ? ? ? ?Time spent: 24 minutes ? ?Author: ? , MD ?11/15/2021 11:17 AM ? ?For on call review [email or web address].  ?

## 2021-11-15 NOTE — TOC Progression Note (Signed)
Transition of Care (TOC) - Progression Note  ? ? ?Patient Details  ?Name: Jackie Garcia ?MRN: [id number] ?Date of Birth: 1941/03/13 ? ?Transition of Care (TOC) CM/SW Contact  ?Beverly Sessions, RN ?Phone Number: ?11/15/2021, 9:44 AM ? ?Clinical Narrative:    ? ?Per [facility] appeal has not been filed.   ?Voicemail left for Erline Levine to follow up ? ?  ?  ? ?Expected Discharge Plan and Services ?  ?  ?  ?  ?  ?Expected Discharge Date: 11/14/21               ?  ?  ?  ?  ?  ?  ?  ?  ?  ?  ? ? ?Social Determinants of Health (SDOH) Interventions ?  ? ?Readmission Risk Interventions ?   ? View : No data to display.  ?  ?  ?  ? ? ?

## 2021-11-15 NOTE — TOC Progression Note (Signed)
Transition of Care (TOC) - Progression Note  ? ? ?Patient Details  ?Name: Jackie Garcia ?MRN: [id number] ?Date of Birth: 01/23/1941 ? ?Transition of Care (TOC) CM/SW Contact  ?[name] ?Phone Number: ?11/15/2021, 10:06 AM ? ?[facility] Appeal ?Detailed Notice of Discharge letter created and saved: Yes (Created by Bevelyn Ngo, RNCM) ?Detailed Notice of Discharge Document Given to [facility]: Yes (Delivered by Bevelyn Ngo, RNCM) ?[facility] ROI Document Created: Yes ?[facility] appeal documents uploaded to [facility] stite: Yes (Confirmation of upload:[id number])  ? ? ?[facility] Case ID: [id number].  Records uploaded to [facility] portal (confirmation # above). ? ?

## 2021-11-16 DIAGNOSIS — J189 Pneumonia, unspecified organism: Secondary | ICD-10-CM | POA: Diagnosis not present

## 2021-11-16 DIAGNOSIS — J441 Chronic obstructive pulmonary disease with (acute) exacerbation: Secondary | ICD-10-CM | POA: Diagnosis not present

## 2021-11-16 DIAGNOSIS — F320 Major depressive disorder, single episode, mild: Secondary | ICD-10-CM | POA: Diagnosis not present

## 2021-11-16 NOTE — Progress Notes (Signed)
?  Progress Note ? ? ?Patient: Jackie Garcia [id number] DOB: [birth year] DOA: 11/06/2021     10 ?DOS: the patient was seen and examined on 11/16/2021 ?  ?Brief hospital course: ?81 year old woman with PMH of emphysema, GERD< dementia, anxiety, hypothyroidism presented with SI with daughter.  IVC placed.  Psychiatry consulted and agree need for inpatient BH.  Also with cough, confusion, wheezing, dyspnea and admitted for CAP and COPD exacerbation.  ?Condition has improved, currently pending placement.  ?Physical therapy/Occupational Therapy recommended home with home care.   ? ? ?Assessment and Plan: ?* Community acquired pneumonia ?Completed antibiotics. ? ?COPD exacerbation (HCC) ?Completed steroids, condition resolved ? ?Depression ?Patient was evaluated by psychiatry, not able to take patient to [facility] psych due to history of dementia.   ?Patient currently is medically stable to be discharged, physical therapy evaluate the patient, does not need nursing home placement.  Granddaughter indicated that she will take patient home. ?Patient medical condition stable, ready for discharge.  Granddaughters home is not ready.  She was discharged, granddaughter filed appeal.  Appeal was also denied, he indicated that she will file an reconsideration for the denial.   ? ? ? ?Hypothyroidism ?Synthroid. ? ?Dyslipidemia ?Continue statin. ? ?Essential hypertension ?Norvasc.  Blood pressure controlled. ? ?Tobacco abuse ?Advised to quit. ? ? ? ? ?  ? ?Subjective:  ?No new issues. ? ?Physical Exam: ?Vitals:  ? 11/15/21 0413 11/15/21 2218 11/16/21 0502 11/16/21 0749  ?BP: (!) 142/66 138/77 102/62 135/83  ?Pulse: (!) 58 60 68 70  ?Resp: 18   18  ?Temp: 98.5 ?F (36.9 ?C) 98.7 ?F (37.1 ?C) 98.2 ?F (36.8 ?C) 97.9 ?F (36.6 ?C)  ?TempSrc:  Oral Oral   ?SpO2: 95% 94% 97% 94%  ?Weight:      ?Height:      ? ?General exam: Appears calm and comfortable  ?Respiratory system: Clear to auscultation. Respiratory effort normal. ?Cardiovascular  system: S1 & S2 heard, RRR. No JVD, murmurs, rubs, gallops or clicks. No pedal edema. ?Gastrointestinal system: Abdomen is nondistended, soft and nontender. No organomegaly or masses felt. Normal bowel sounds heard. ?Central nervous system: Alert and oriented. No focal neurological deficits. ?Extremities: Symmetric 5 x 5 power. ?Skin: No rashes, lesions or ulcers ?Psychiatry: Judgement and insight appear normal. Mood & affect appropriate.  ? ?Data Reviewed: ? ?There are no new results to review at this time. ? ?Family Communication: Granddaughter updated. ? ?Disposition: ?Status is: Inpatient ?Remains inpatient appropriate because: Unsafe discharge ? Planned Discharge Destination: Home with Home Health ? ? ? ?Time spent: 28 minutes ? ?Author: ?Marrion Coy, MD ?11/16/2021 9:38 AM ? ?For on call review [email or web address].  ?

## 2021-11-16 NOTE — Plan of Care (Signed)
?  Problem: Activity: ?Goal: Ability to tolerate increased activity will improve ?Outcome: Progressing ?  ?Problem: Clinical Measurements: ?Goal: Ability to maintain a body temperature in the normal range will improve ?Outcome: Progressing ?  ?Problem: Respiratory: ?Goal: Ability to maintain adequate ventilation will improve ?Outcome: Progressing ?Goal: Ability to maintain a clear airway will improve ?Outcome: Progressing ?  ?Problem: Education: ?Goal: Knowledge of General Education information will improve ?Description: Including pain rating scale, medication(s)/side effects and non-pharmacologic comfort measures ?Outcome: Progressing ?  ?Problem: Health Behavior/Discharge Planning: ?Goal: Ability to manage health-related needs will improve ?Outcome: Progressing ?  ?Problem: Clinical Measurements: ?Goal: Ability to maintain clinical measurements within normal limits will improve ?Outcome: Progressing ?Goal: Will remain free from infection ?Outcome: Progressing ?Goal: Diagnostic test results will improve ?Outcome: Progressing ?Goal: Respiratory complications will improve ?Outcome: Progressing ?Goal: Cardiovascular complication will be avoided ?Outcome: Progressing ?  ?Problem: Activity: ?Goal: Risk for activity intolerance will decrease ?Outcome: Progressing ?  ?Problem: Nutrition: ?Goal: Adequate nutrition will be maintained ?Outcome: Progressing ?  ?Problem: Coping: ?Goal: Level of anxiety will decrease ?Outcome: Progressing ?  ?Problem: Elimination: ?Goal: Will not experience complications related to bowel motility ?Outcome: Progressing ?Goal: Will not experience complications related to urinary retention ?Outcome: Progressing ?  ?Problem: Pain Managment: ?Goal: General experience of comfort will improve ?Outcome: Progressing ?  ?Problem: Safety: ?Goal: Ability to remain free from injury will improve ?Outcome: Progressing ?  ?Problem: Skin Integrity: ?Goal: Risk for impaired skin integrity will decrease ?Outcome:  Progressing ?  ?Problem: Education: ?Goal: Knowledge of warning signs, risks, and behaviors that relate to suicide ideation and self-harm behaviors will improve ?Outcome: Progressing ?  ?Problem: Health Behavior/Discharge (Transition) Planning: ?Goal: Ability to manage health-related needs will improve ?Outcome: Progressing ?  ?Problem: Clinical Measurements: ?Goal: Remain free from any harm during hospitalization ?Outcome: Progressing ?  ?Problem: Nutrition: ?Goal: Adequate fluids and nutrition will be maintained ?Outcome: Progressing ?  ?Problem: Coping: ?Goal: Ability to disclose and discuss thoughts of suicide and self-harm will improve ?Outcome: Progressing ?  ?Problem: Medication Management: ?Goal: Adhere to prescribed medication regimen ?Outcome: Progressing ?  ?Problem: Sleep Hygiene: ?Goal: Ability to obtain adequate restful sleep will improve ?Outcome: Progressing ?  ?Problem: Self Esteem: ?Goal: Ability to verbalize positive feeling about self will improve ?Outcome: Progressing ?  ?

## 2021-11-16 NOTE — TOC Progression Note (Addendum)
Transition of Care (TOC) - Progression Note  ? ? ?Patient Details  ?Name: Jackie Garcia ?MRN: [id number] ?Date of Birth: [birth year] ? ?Transition of Care (TOC) CM/SW Contact  ?Gemma Ruan, [place], [place] ?Phone Number: [phone] ?11/16/2021, 10:31 AM ? ?Clinical Narrative:    ? ?Patient's appeal denied through [facility]. Patient's grand daughter [name] contacted, confirmed that she is aware of denial and possible financial liability. Per granddaughter she will plan to file a re-consideration appeal today.  ? ?[employer] contacted, hospital bed has been ordered, on schedule to be delivered on Monday. ? ?12:04pm Phone call from patient's granddaughter stating that she has filed the appeal reconsideration and received this reference number [id number]. ? ?[employer], LCSW ?Transition of Care ?[phone] ? ?  ? ?Expected Discharge Plan and Services ?  ?  ?  ?  ?  ?Expected Discharge Date: 11/14/21               ?  ?  ?  ?  ?  ?  ?  ?  ?  ?  ? ? ?Social Determinants of Health (SDOH) Interventions ?  ? ?Readmission Risk Interventions ?   ? View : No data to display.  ?  ?  ?  ? ? ?

## 2021-11-17 DIAGNOSIS — J189 Pneumonia, unspecified organism: Secondary | ICD-10-CM | POA: Diagnosis not present

## 2021-11-17 MED ORDER — MONTELUKAST SODIUM 10 MG PO TABS
10.0000 mg | ORAL_TABLET | Freq: Every day | ORAL | Status: DC
  Administered 2021-11-17 – 2021-11-18 (×2): 10 mg via ORAL
  Filled 2021-11-17 (×2): qty 1

## 2021-11-17 NOTE — TOC Progression Note (Signed)
Transition of Care (TOC) - Progression Note  ? ? ?Patient Details  ?Name: Jackie Garcia ?MRN: [id number] ?Date of Birth: [birth year] ? ?Transition of Care (TOC) CM/SW Contact  ?Jacy Howat, [place], [place] ?Phone Number: ?11/17/2021, 12:17 PM ? ?Clinical Narrative:    ? ?Phone call to patient's granddaughter [name] to confirm plan for discharge on 11/18/21.  It has been confirmed that hospital bed is scheduled to be delivered by [employer] on 11/18/21. Re-consideration appeal completed by granddaughter on 11/16/21.  Reference number [id number]. ? ?Transition of Care to continue to follow ? ?Hewitt Garner, LCSW ?Transition of Care ?[phone] ? ? ?  ?  ? ?Expected Discharge Plan and Services ?  ?  ?  ?  ?  ?Expected Discharge Date: 11/14/21               ?  ?  ?  ?  ?  ?  ?  ?  ?  ?  ? ? ?Social Determinants of Health (SDOH) Interventions ?  ? ?Readmission Risk Interventions ?   ? View : No data to display.  ?  ?  ?  ? ? ?

## 2021-11-17 NOTE — Progress Notes (Signed)
?  Progress Note ? ? ?Patient: Jackie Garcia [id number] DOB: 03/13/1941 DOA: 11/06/2021     11 ?DOS: the patient was seen and examined on 11/17/2021 ?  ?Brief hospital course: ?81 year old woman with PMH of emphysema, GERD< dementia, anxiety, hypothyroidism presented with SI with daughter.  IVC placed.  Psychiatry consulted and agree need for inpatient BH.  Also with cough, confusion, wheezing, dyspnea and admitted for CAP and COPD exacerbation.  ?Condition has improved, currently pending placement.  ?Physical therapy/Occupational Therapy recommended home with home care.   ? ? ?Assessment and Plan: ?* Community acquired pneumonia ?Completed antibiotics. Resolved ? ?COPD exacerbation (HCC) ?Completed steroids, condition resolved. Continuing home meds ? ?Depression ?Patient was evaluated by psychiatry, not able to take patient to [facility] psych due to history of dementia.   ?Patient currently is medically stable to be discharged, physical therapy evaluate the patient, does not need nursing home placement.  Granddaughter indicated that she will take patient home. ?Patient medical condition stable, ready for discharge.  Granddaughters home is not ready.  She was discharged, granddaughter filed appeal.  Appeal was also denied, she has re-appealed. May be able to d/c tomorrow. ? ?Hypothyroidism ?Synthroid. ? ?Dyslipidemia ?Continue statin. ? ?Essential hypertension ?Norvasc.  Blood pressure controlled. ? ?Tobacco abuse ?Advised to quit. ? ? ?Subjective:  ?No new issues. Breathing stable, no cough. ? ?Physical Exam: ?Vitals:  ? 11/16/21 1554 11/16/21 1949 11/17/21 0455 11/17/21 0754  ?BP: 137/63 127/62 (!) 143/63 128/60  ?Pulse: 65 (!) 55 72 61  ?Resp: 18 16 20 18   ?Temp: 99.1 ?F (37.3 ?C) 97.9 ?F (36.6 ?C) 97.7 ?F (36.5 ?C) 98.7 ?F (37.1 ?C)  ?TempSrc:  Oral Oral   ?SpO2: 97% 92% 92% 96%  ?Weight:      ?Height:      ? ?General exam: Appears calm and comfortable  ?Respiratory system: Clear to auscultation. Respiratory  effort normal. ?Cardiovascular system: S1 & S2 heard, RRR. No JVD, murmurs, rubs, gallops or clicks. No pedal edema. ?Gastrointestinal system: Abdomen is nondistended, soft and nontender. No organomegaly or masses felt. Normal bowel sounds heard. ?Central nervous system: Alert and oriented. No focal neurological deficits. ?Extremities: Symmetric 5 x 5 power. ?Skin: No rashes, lesions or ulcers ?Psychiatry: calm ? ?Data Reviewed: ? ?There are no new results to review at this time. ? ?Family Communication: none @ bedside ? ?Disposition: ?Status is: Inpatient ?Remains inpatient appropriate because: Unsafe discharge ? Planned Discharge Destination: Home with Home Health ?Dvt ppx: lovenox (patient refusing so will order scds) ?Time spent: 25 minutes ? ?Author: ? , MD ?11/17/2021 12:08 PM ? ?For on call review [email or web address].  ?

## 2021-11-18 DIAGNOSIS — J189 Pneumonia, unspecified organism: Secondary | ICD-10-CM | POA: Diagnosis not present

## 2021-11-18 NOTE — TOC Progression Note (Addendum)
Transition of Care (TOC) - Progression Note  ? ? ?Patient Details  ?Name: Jackie Garcia ?MRN: [id number] ?Date of Birth: Jun 15, 1941 ? ?Transition of Care (TOC) CM/SW Contact  ?Candie Chroman, LCSW ?Phone Number: ?11/18/2021, 11:07 AM ? ?Clinical Narrative:   Left voicemail for granddaughter to see if she had heard anything about delivery time for hospital bed. ? ?1:26 pm: Tried calling granddaughter again. Did not leave another voicemail. ? ?1:49 pm: Per [employer] representative, hospital bed was delivered at 12:28 pm. Still no call back from granddaughter. ? ?2:22 pm: Left granddaughter another vm. ? ?4:08 pm: Received call back from granddaughter. She is finishing up getting everything ready for patient to move in with her. She confirmed hospital bed was delivered. She has an appt in the morning that she cannot miss but will be here around 12:00/1:00 to pick her up. Left message for [facility] representative to let him know that we will plan on discharge tomorrow. ? ?4:35 pm: Received call from [facility] representative. They were active with her for PT, RN prior to admission. ? ?Expected Discharge Plan and Services ?  ?  ?  ?  ?  ?Expected Discharge Date: 11/14/21               ?  ?  ?  ?  ?  ?  ?  ?  ?  ?  ? ? ?Social Determinants of Health (SDOH) Interventions ?  ? ?Readmission Risk Interventions ?   ? View : No data to display.  ?  ?  ?  ? ? ?

## 2021-11-18 NOTE — Progress Notes (Signed)
?  Progress Note ? ? ?Patient: Jackie Garcia [id number] DOB: 1941/02/07 DOA: 11/06/2021     12 ?DOS: the patient was seen and examined on 11/18/2021 ?  ?Brief hospital course: ?81 year old woman with PMH of emphysema, GERD< dementia, anxiety, hypothyroidism presented with SI with daughter.  IVC placed.  Psychiatry consulted and agree need for inpatient BH.  Also with cough, confusion, wheezing, dyspnea and admitted for CAP and COPD exacerbation.  ?Condition has improved, currently pending placement.  ?Physical therapy/Occupational Therapy recommended home with home care.   ? ? ?Assessment and Plan: ?Community acquired pneumonia ?Completed antibiotics. Resolved ? ?COPD exacerbation ([place]) ?Completed steroids, condition resolved. Continuing home meds ? ?Depression ?Patient was evaluated by psychiatry, not able to take patient to [facility] psych due to history of dementia.   ?Patient currently is medically stable to be discharged, physical therapy evaluate the patient, does not need nursing home placement.  Granddaughter indicated that she will take patient home. ?Patient medical condition stable, ready for discharge.  Granddaughters home is not ready.  She was discharged, granddaughter filed appeal.  Appeal was also denied, she has reportedly re-appealed. TOC involved ? ?Hypothyroidism ?Synthroid. ? ?Dyslipidemia ?Continue statin. ? ?Essential hypertension ?Norvasc.  Blood pressure controlled. ? ?Tobacco abuse ?Advised to quit. ? ? ?Subjective:  ?No new issues. Breathing stable, no cough. No complaints. ? ?Physical Exam: ?Vitals:  ? 11/17/21 1603 11/17/21 1924 11/18/21 0508 11/18/21 0745  ?BP: (!) 142/61 134/67 138/71 (!) 120/56  ?Pulse: 74 61 71 65  ?Resp: 18 18 20 20   ?Temp: 98.1 ?F (36.7 ?C) 98.3 ?F (36.8 ?C) 98 ?F (36.7 ?C) 97.6 ?F (36.4 ?C)  ?TempSrc:  Oral Oral   ?SpO2: 90% 92% 92% 94%  ?Weight:      ?Height:      ? ?General exam: Appears calm and comfortable  ?Respiratory system: Clear to auscultation. Respiratory  effort normal. ?Cardiovascular system: S1 & S2 heard, RRR. No JVD, murmurs, rubs, gallops or clicks. No pedal edema. ?Gastrointestinal system: Abdomen is nondistended, soft and nontender. No organomegaly or masses felt. Normal bowel sounds heard. ?Central nervous system: Alert and oriented. No focal neurological deficits. ?Extremities: Symmetric 5 x 5 power. ?Skin: No rashes, lesions or ulcers ?Psychiatry: calm ? ?Data Reviewed: ? ?There are no new results to review at this time. ? ?Family Communication: none @ bedside ? ?Disposition: ?Status is: Inpatient ?Remains inpatient appropriate because: Unsafe discharge ? Planned Discharge Destination: Home with Home Health ?Dvt ppx: lovenox (patient refusing so will order scds) ?Time spent: 15 minutes ? ?Author: ?Desma Maxim, MD ?11/18/2021 12:30 PM ? ?For on call review [email or web address]  ?

## 2021-11-19 DIAGNOSIS — J189 Pneumonia, unspecified organism: Secondary | ICD-10-CM | POA: Diagnosis not present

## 2021-11-19 NOTE — Care Management Important Message (Signed)
Important Message ? ?Patient Details  ?Name: Jackie Garcia ?MRN: [id number] ?Date of Birth: 1940/12/28 ? ? ?Medicare Important Message Given:  Yes ? ? ? ? ?Olegario Messier A [name] ?11/19/2021, 11:19 AM ?

## 2021-11-19 NOTE — Discharge Summary (Addendum)
Jackie HolmesMarcia K Malczewski ZOX:[id number] DOB: 02/02/1941 DOA: 11/06/2021 ? ?PCP: Araceli Boucheumley, [place], DO ? ?Admit date: 11/06/2021 ?Discharge date: 11/19/2021 ? ?Time spent: 35 minutes ? ?Recommendations for Outpatient Follow-up:  ?Pcp f/u, psychiatry referral ? ? ? ?Discharge Diagnoses:  ?Principal Problem: ?  Community acquired pneumonia ?Active Problems: ?  COPD exacerbation (HCC) ?  Hypothyroidism ?  Depression ?  Tobacco abuse ?  Essential hypertension ?  Dyslipidemia ? ? ?Discharge Condition: improved ? ?Diet recommendation: heart healthy ? ?Filed Weights  ? 11/06/21 1452 11/07/21 1944  ?Weight: 88 kg 83.1 kg  ? ? ?History of present illness:  ?From admission h and p ?Jackie HolmesMarcia K Blow is a 81 y.o. female with medical history significant for emphysema, GERD, dementia, anxiety and hypothyroidism, who presented to the emergency room with acute onset of suicidal ideation.  Daughter.  The patient was IVC.  While she was here she was more confused.  She admitted to cough productive of clear sputum as well as wheezing and dyspnea.  No fever or chills.  No nausea or vomiting or abdominal pain.  No chest pain or palpitations.  No headache or dizziness or blurred vision. ? ?Hospital Course:  ?Patient presented with suicidal ideation. Patient was involuntarily committed and psychiatry was consulted. Home psychiatric meds were continued. Psychiatry judged the patient to not be a danger to herself or others and IVC was discontinued. Patient was found to have CAP/COPD exacerbation and was treated with a course of steroids and antibiotics with resolution of symptoms. She was stable for discharge but family appealed discharge. Ultimately granddaughter agreeable to discharge to her home. Home health pt/ot/nursing ordered. ? ?Procedures: ?none ? ?Consultations: ?psychiatry ? ?Discharge Exam: ?Vitals:  ? 11/19/21 0445 11/19/21 0756  ?BP: 128/82 (!) 147/72  ?Pulse: 63 72  ?Resp: 18 16  ?Temp: 97.9 ?F (36.6 ?C) 97.8 ?F (36.6 ?C)  ?SpO2: 97% 99%   ? ? ?General exam: Appears calm and comfortable  ?Respiratory system: Clear to auscultation. Respiratory effort normal. ?Cardiovascular system: S1 & S2 heard, RRR. No JVD, murmurs, rubs, gallops or clicks. No pedal edema. ?Gastrointestinal system: Abdomen is nondistended, soft and nontender. No organomegaly or masses felt. Normal bowel sounds heard. ?Central nervous system: Alert and oriented. No focal neurological deficits. ?Extremities: Symmetric 5 x 5 power. ?Skin: No rashes, lesions or ulcers ?Psychiatry: calm ? ?Discharge Instructions ? ? ?Discharge Instructions   ? ? Ambulatory referral to Psychiatry   Complete by: As directed ?  ? Diet - low sodium heart healthy   Complete by: As directed ?  ? Diet - low sodium heart healthy   Complete by: As directed ?  ? Increase activity slowly   Complete by: As directed ?  ? Increase activity slowly   Complete by: As directed ?  ? ?  ? ?Allergies as of 11/19/2021   ? ?   Reactions  ? Naproxen Swelling  ? Sulfa Antibiotics Rash  ? Codeine Itching  ? ?  ? ?  ?Medication List  ?  ? ?TAKE these medications   ? ?AeroChamber Plus inhaler ?Use with inhaler ?  ?albuterol 108 (90 Base) MCG/ACT inhaler ?Commonly known as: VENTOLIN HFA ?Inhale 2 puffs into the lungs every 4 (four) hours as needed for wheezing or shortness of breath. ?  ?amLODipine 10 MG tablet ?Commonly known as: NORVASC ?Take 10 mg by mouth daily. ?  ?CVS Purelax 17 g packet ?Generic drug: polyethylene glycol ?Take 17 g by mouth daily. ?  ?CVS Senna 8.6  MG tablet ?Generic drug: senna ?Take 2 tablets by mouth at bedtime. ?  ?levothyroxine 50 MCG tablet ?Commonly known as: SYNTHROID ?Take 50 mcg by mouth in the morning. ?What changed: Another medication with the same name was removed. Continue taking this medication, and follow the directions you see here. ?  ?montelukast 10 MG tablet ?Commonly known as: SINGULAIR ?Take 10 mg by mouth daily. ?  ?MULTIVITAMIN PO ?Take 1 tablet by mouth daily. ?  ?omeprazole 20 MG  capsule ?Commonly known as: PRILOSEC ?Take 20 mg by mouth daily. ?  ?rosuvastatin 5 MG tablet ?Commonly known as: CRESTOR ?Take 5 mg by mouth in the morning. ?  ?sertraline 100 MG tablet ?Commonly known as: ZOLOFT ?Take 100 mg by mouth every morning. ?What changed: Another medication with the same name was removed. Continue taking this medication, and follow the directions you see here. ?  ?Symbicort 160-4.5 MCG/ACT inhaler ?Generic drug: budesonide-formoterol ?Inhale 2 puffs into the lungs 2 (two) times daily. ?  ?traZODone 100 MG tablet ?Commonly known as: DESYREL ?Take 100 mg by mouth at bedtime as needed. ?  ? ?  ? ?  ?  ? ? ?  ?Durable Medical Equipment  ?(From admission, onward)  ?  ? ? ?  ? ?  Start     Ordered  ? 11/12/21 1436  For home use only DME Hospital bed  Once       ?Question Answer Comment  ?Length of Need Lifetime   ?Patient has (list medical condition): Dementia   ?The above medical condition requires: Patient requires the ability to reposition frequently   ?Head must be elevated greater than: 45 degrees   ?Bed type Semi-electric   ?Support Surface: Gel Overlay   ?  ? 11/12/21 1435  ? ?  ?  ? ?  ? ?Allergies  ?Allergen Reactions  ? Naproxen Swelling  ? Sulfa Antibiotics Rash  ? Codeine Itching  ? ? Follow-up Information   ? ? [facility] Follow up.   ?Specialty: Home Health Services ?Why: They will resume home health services at discharge: Physical therapy, nursing. ?Contact information: ?[address] ?[address] ?[place] [address] ?[phone] ? ? ?  ?  ? ? [place], [place], DO Follow up.   ?Specialty: Family Medicine ?Contact information: ?[address] ?[address] ?[place] [address] ?[phone] ? ? ?  ?  ? ?  ?  ? ?  ? ? ? ?The results of significant diagnostics from this hospitalization (including imaging, microbiology, ancillary and laboratory) are listed below for reference.   ? ?Significant Diagnostic Studies: ?CT HEAD WO CONTRAST ( ) ? ?Result Date:  11/06/2021 ?CLINICAL DATA:  Mental status change, unknown cause EXAM: CT HEAD WITHOUT CONTRAST TECHNIQUE: Contiguous axial images were obtained from the base of the skull through the vertex without intravenous contrast. RADIATION DOSE REDUCTION: This exam was performed according to the departmental dose-optimization program which includes automated exposure control, adjustment of the mA and/or kV according to patient size and/or use of iterative reconstruction technique. COMPARISON:  Head CT 08/24/2011 FINDINGS: Brain: No evidence of acute intracranial hemorrhage or extra-axial collection.No evidence of mass lesion/concerning mass effect.The ventricles are normal in size.Scattered subcortical and periventricular white matter hypodensities, nonspecific but likely sequela of chronic small vessel ischemic disease.Progressive mild cerebral atrophy. Vascular: Vascular calcifications.  No hyperdense vessel. Skull: Normal. Negative for fracture or focal lesion. Sinuses/Orbits: No acute finding. Other: None. IMPRESSION: No acute intracranial abnormality. Mild sequela of chronic small vessel  ischemic disease. Cerebral atrophy progressed since the prior exam in February 2013. Electronically Signed   By: Caprice Renshaw M.D.   On: 11/06/2021 15:52  ? ?DG Chest Portable 1 View ? ?Result Date: 11/06/2021 ?CLINICAL DATA:  Shortness of breath EXAM: PORTABLE CHEST 1 VIEW COMPARISON:  04/20/2021 FINDINGS: Transverse diameter of heart is increased. Thoracic aorta is tortuous. There is poor inspiration. Increased interstitial markings are seen in both lungs, more so on the right side. There is no focal pulmonary consolidation. Lateral CP angles are indistinct. There is no pneumothorax. IMPRESSION: Increased interstitial markings are seen in both lungs, more so on the right side. Findings suggest asymmetric interstitial edema or bilateral interstitial pneumonia. There is no focal pulmonary consolidation. There is blunting of both lateral CP  angles suggesting small bilateral pleural effusions. Electronically Signed   By: Ernie Avena M.D.   On: 11/06/2021 16:06   ? ?Microbiology: ?No results found for this or any previous visit (from the past 240 h

## 2021-11-19 NOTE — TOC Transition Note (Addendum)
Transition of Care (TOC) - CM/SW Discharge Note ? ? ?Patient Details  ?Name: CASSADEE VANZANDT ?MRN: [id number] ?Date of Birth: Apr 25, 1941 ? ?Transition of Care (TOC) CM/SW Contact:  ?Margarito Liner, LCSW ?Phone Number: ?11/19/2021, 10:40 AM ? ? ?Clinical Narrative:  Patient has orders to discharge home today. [facility] representative is aware. No further concerns. CSW signing off.  ? ?1:25 pm: Per CMA, patient lost appeal reconsideration. Last covered day was 4/28 with charges starting 4/29 at noon. ? ?Final next level of care: Home w Home Health Services ?Barriers to Discharge: Barriers Resolved ? ? ?Patient Goals and CMS Choice ?  ?  ?Choice offered to / list presented to : NA ? ?Discharge Placement ?  ?           ?  ?Patient to be transferred to facility by: Granddaughter ?Name of family member notified: [name] ?Patient and family notified of of transfer: 11/18/21 ? ?Discharge Plan and Services ?  ?  ?           ?DME Arranged: Hospital bed ?DME Agency: [employer] ?Date DME Agency Contacted: 11/18/21 ?  ?Representative spoke with at DME Agency: Oletha Cruel ?HH Arranged: RN, PT, OT ?HH Agency: [facility] ?Date HH Agency Contacted: 11/19/21 ?  ?  ? ?Social Determinants of Health (SDOH) Interventions ?  ? ? ?Readmission Risk Interventions ?   ? View : No data to display.  ?  ?  ?  ? ? ? ? ? ?

## 2021-12-10 ENCOUNTER — Encounter (HOSPITAL_COMMUNITY): Payer: Self-pay

## 2021-12-10 ENCOUNTER — Inpatient Hospital Stay (HOSPITAL_COMMUNITY)
Admission: EM | Admit: 2021-12-10 | Discharge: 2021-12-25 | DRG: 641 | Disposition: A | Discharge status: Skilled Nursing Facility | Payer: Medicare HMO | Attending: Family Medicine | Admitting: Family Medicine

## 2021-12-10 ENCOUNTER — Other Ambulatory Visit: Payer: Self-pay

## 2021-12-10 DIAGNOSIS — Z79890 Hormone replacement therapy (postmenopausal): Secondary | ICD-10-CM

## 2021-12-10 DIAGNOSIS — J449 Chronic obstructive pulmonary disease, unspecified: Secondary | ICD-10-CM | POA: Diagnosis present

## 2021-12-10 DIAGNOSIS — K219 Gastro-esophageal reflux disease without esophagitis: Secondary | ICD-10-CM | POA: Diagnosis present

## 2021-12-10 DIAGNOSIS — Z833 Family history of diabetes mellitus: Secondary | ICD-10-CM

## 2021-12-10 DIAGNOSIS — I10 Essential (primary) hypertension: Secondary | ICD-10-CM | POA: Diagnosis present

## 2021-12-10 DIAGNOSIS — Z801 Family history of malignant neoplasm of trachea, bronchus and lung: Secondary | ICD-10-CM

## 2021-12-10 DIAGNOSIS — F319 Bipolar disorder, unspecified: Secondary | ICD-10-CM | POA: Diagnosis present

## 2021-12-10 DIAGNOSIS — F03918 Unspecified dementia, unspecified severity, with other behavioral disturbance: Secondary | ICD-10-CM | POA: Diagnosis present

## 2021-12-10 DIAGNOSIS — Z885 Allergy status to narcotic agent status: Secondary | ICD-10-CM

## 2021-12-10 DIAGNOSIS — Z6833 Body mass index (BMI) 33.0-33.9, adult: Secondary | ICD-10-CM

## 2021-12-10 DIAGNOSIS — Z7951 Long term (current) use of inhaled steroids: Secondary | ICD-10-CM

## 2021-12-10 DIAGNOSIS — R45851 Suicidal ideations: Secondary | ICD-10-CM | POA: Diagnosis present

## 2021-12-10 DIAGNOSIS — E860 Dehydration: Principal | ICD-10-CM | POA: Diagnosis present

## 2021-12-10 DIAGNOSIS — F32A Depression, unspecified: Secondary | ICD-10-CM | POA: Diagnosis present

## 2021-12-10 DIAGNOSIS — E876 Hypokalemia: Secondary | ICD-10-CM | POA: Diagnosis present

## 2021-12-10 DIAGNOSIS — R778 Other specified abnormalities of plasma proteins: Secondary | ICD-10-CM | POA: Diagnosis present

## 2021-12-10 DIAGNOSIS — E039 Hypothyroidism, unspecified: Secondary | ICD-10-CM | POA: Diagnosis present

## 2021-12-10 DIAGNOSIS — F17210 Nicotine dependence, cigarettes, uncomplicated: Secondary | ICD-10-CM | POA: Diagnosis present

## 2021-12-10 DIAGNOSIS — Z882 Allergy status to sulfonamides status: Secondary | ICD-10-CM

## 2021-12-10 DIAGNOSIS — E785 Hyperlipidemia, unspecified: Secondary | ICD-10-CM | POA: Diagnosis present

## 2021-12-10 DIAGNOSIS — Z0000 Encounter for general adult medical examination without abnormal findings: Principal | ICD-10-CM

## 2021-12-10 DIAGNOSIS — Z886 Allergy status to analgesic agent status: Secondary | ICD-10-CM

## 2021-12-10 DIAGNOSIS — J439 Emphysema, unspecified: Secondary | ICD-10-CM | POA: Diagnosis present

## 2021-12-10 DIAGNOSIS — R627 Adult failure to thrive: Secondary | ICD-10-CM | POA: Diagnosis present

## 2021-12-10 DIAGNOSIS — Z91199 Patient's noncompliance with other medical treatment and regimen due to unspecified reason: Secondary | ICD-10-CM

## 2021-12-10 DIAGNOSIS — I480 Paroxysmal atrial fibrillation: Secondary | ICD-10-CM | POA: Diagnosis present

## 2021-12-10 DIAGNOSIS — Z79899 Other long term (current) drug therapy: Secondary | ICD-10-CM

## 2021-12-10 DIAGNOSIS — F0393 Unspecified dementia, unspecified severity, with mood disturbance: Secondary | ICD-10-CM | POA: Diagnosis present

## 2021-12-10 NOTE — ED Provider Notes (Signed)
[facility]EM[facility]GENCY [facility]PT Provider Note   CSN: [id number] Arrival date & time: 12/10/21  1952     History  Chief Complaint  Patient presents with   Diarrhea    NANEA JARED is a 81 y.o. female.  HPI  81 year old female presents to the emergency department at the request of her granddaughter for not caring for herself.  The granddaughter reports that she is not eating or drinking properly or taking her medications.  She states that she is now experiencing incontinence due to refusal to go to the bathroom.  The patient states that she is here against her will.  States that she does not want to go back and live with the granddaughter.  She refuses any sort of medical care evaluation.  At this time does not want to go to a facility.  Declines any acute complaints.  Per the granddaughter the patient ambulates with a cane.  She is oriented to name, date of birth, year and president.  The daughter believes that she is suffering from severe dementia and/or psychiatric disease.  No evidence of any hallucinations, self-harm/SI/HI.  Home Medications Prior to Admission medications   Medication Sig Start Date End Date Taking? Authorizing Provider  albuterol (VENTOLIN HFA) 108 (90 Base) MCG/ACT inhaler Inhale 2 puffs into the lungs every 4 (four) hours as needed for wheezing or shortness of breath. 04/20/21   Domenick Gong, MD  amLODipine (NORVASC) 10 MG tablet Take 10 mg by mouth daily. 10/16/21   [provider]  CVS PURELAX 17 g packet Take 17 g by mouth daily. 10/16/21   [provider]  CVS SENNA 8.6 MG tablet Take 2 tablets by mouth at bedtime. 10/16/21   [provider]  levothyroxine (SYNTHROID) 50 MCG tablet Take 50 mcg by mouth in the morning. 08/26/21   [provider]  montelukast (SINGULAIR) 10 MG tablet Take 10 mg by mouth daily. 08/19/21   [provider]  Multiple Vitamin (MULTIVITAMIN PO) Take 1 tablet by mouth  daily.    [provider]  omeprazole (PRILOSEC) 20 MG capsule Take 20 mg by mouth daily. 05/08/20   [provider]  rosuvastatin (CRESTOR) 5 MG tablet Take 5 mg by mouth in the morning. 09/19/21   [provider]  sertraline (ZOLOFT) 100 MG tablet Take 100 mg by mouth every morning. 08/19/21 08/19/22  [provider]  Spacer/Aero-Holding Chambers (AEROCHAMBER PLUS) inhaler Use with inhaler 04/20/21   Domenick Gong, MD  SYMBICORT 160-4.5 MCG/ACT inhaler Inhale 2 puffs into the lungs 2 (two) times daily. 05/27/21   [provider]  traZODone (DESYREL) 100 MG tablet Take 100 mg by mouth at bedtime as needed. 08/18/21   [provider]      Allergies    Naproxen, Sulfa antibiotics, and Codeine    Review of Systems   Review of Systems  Constitutional:  Negative for fever.  Respiratory:  Negative for shortness of breath.   Cardiovascular:  Negative for chest pain.  Musculoskeletal:  Negative for back pain.  Neurological:  Negative for headaches.   Physical Exam Updated Vital Signs BP (!) 113/50   Pulse 62   Temp 98 F (36.7 C) (Oral)   Resp 20   Ht 5\' 2"  (1.575 m)   Wt 83.1 kg   SpO2 96%   BMI 33.51 kg/m  Physical Exam Vitals and nursing note reviewed.  Constitutional:      General: She is not in acute distress.  Appearance: She is not diaphoretic.  HENT:     Head: Normocephalic.     Mouth/Throat:     Mouth: Mucous membranes are moist.  Cardiovascular:     Rate and Rhythm: Normal rate.  Pulmonary:     Effort: Pulmonary effort is normal. No respiratory distress.  Skin:    General: Skin is warm.  Neurological:     Mental Status: She is alert and oriented to person, place, and time.  Psychiatric:     Comments: Argumentative, agitated    ED Results / Procedures / Treatments   Labs (all labs ordered are listed, but only abnormal results are displayed) Labs Reviewed - No data to display  EKG None  Radiology No  results found.  Procedures Procedures    Medications Ordered in ED Medications - No data to display  ED Course/ Medical Decision Making/ A&P                           Medical Decision Making  81 year old female presents emergency department by EMS at the request of the granddaughter.  Concerned that she is not eating or drinking or taking any of her medications.  Report of diarrhea but the patient denies this.  Patient is alert and oriented x4.  At times argumentative, agitated and rude but oriented and has capacity make decisions by my evaluation.  Her vitals are normal, from a visual perspective she does not seem to have any acute illness/emergent medical problem.  Had a discussion with the patient and granddaughter at bedside.  The granddaughter states that she is unable to care for her at home with the way that she is acting in regards to the safety of her younger children.  She states that she ambulates with a cane.  Is experiencing incontinence but able to walk to get outside to smoke a cigarette.  The patient is refusing any medical evaluation/lab work.  She is refusing any psychiatric evaluation.  Patient demonstrated no findings of self-harm, SI/HI.  She does not display any findings of hallucinations.  She does not appear to be acutely psychotic.  While she is combative at times she is otherwise oriented and able to hold a conversation.  She does not want to go back home with the granddaughter.  The granddaughter is not capable to care for her at this time.  I discussed with the patient that this means she will be discharged and referred to outpatient shelters of which she agrees.  I have no grounds for IVC.  She otherwise has capacity to refuse any medical evaluation/care.  Granddaughter present for this conversation and understands.        Final Clinical Impression(s) / ED Diagnoses Final diagnoses:  None    Rx / DC Orders ED Discharge Orders     None         Rozelle Logan, DO 12/11/21 0004

## 2021-12-10 NOTE — ED Triage Notes (Addendum)
Pt BIB EMS from home , sent to ED by family for refusal to take meds x 5 days and diarrhea x 2 days. Family reports pt is using the bathroom on herself which is abnormal.   EMS vitals: BP 120/80 HR 60 SPO2 96% 3 L [place] CBG 152

## 2021-12-10 NOTE — ED Notes (Addendum)
Pt refusing purewick and blood work.

## 2021-12-10 NOTE — ED Notes (Signed)
Pt refusing to leave blood pressure cuff and pulse ox on. Pt will not allow for this RN to place her on cardiac monitor.

## 2021-12-10 NOTE — ED Notes (Addendum)
Pt continues to refuse vitals

## 2021-12-11 ENCOUNTER — Emergency Department (HOSPITAL_COMMUNITY): Payer: Medicare HMO

## 2021-12-11 ENCOUNTER — Encounter (HOSPITAL_COMMUNITY): Payer: Self-pay | Admitting: Family Medicine

## 2021-12-11 DIAGNOSIS — E876 Hypokalemia: Secondary | ICD-10-CM | POA: Diagnosis not present

## 2021-12-11 DIAGNOSIS — I10 Essential (primary) hypertension: Secondary | ICD-10-CM

## 2021-12-11 DIAGNOSIS — J449 Chronic obstructive pulmonary disease, unspecified: Secondary | ICD-10-CM

## 2021-12-11 DIAGNOSIS — E039 Hypothyroidism, unspecified: Secondary | ICD-10-CM

## 2021-12-11 DIAGNOSIS — R7989 Other specified abnormal findings of blood chemistry: Secondary | ICD-10-CM | POA: Diagnosis present

## 2021-12-11 DIAGNOSIS — R778 Other specified abnormalities of plasma proteins: Secondary | ICD-10-CM

## 2021-12-11 LAB — CBC WITH DIFFERENTIAL/PLATELET
Abs Immature Granulocytes: 0.01 10*3/uL (ref 0.00–0.07)
Basophils Absolute: 0 10*3/uL (ref 0.0–0.1)
Basophils Relative: 1 %
Eosinophils Absolute: 0.2 10*3/uL (ref 0.0–0.5)
Eosinophils Relative: 3 %
HCT: 47.1 % — ABNORMAL HIGH (ref 36.0–46.0)
Hemoglobin: 15.4 g/dL — ABNORMAL HIGH (ref 12.0–15.0)
Immature Granulocytes: 0 %
Lymphocytes Relative: 36 %
Lymphs Abs: 2.1 10*3/uL (ref 0.7–4.0)
MCH: 28.3 pg (ref 26.0–34.0)
MCHC: 32.7 g/dL (ref 30.0–36.0)
MCV: 86.4 fL (ref 80.0–100.0)
Monocytes Absolute: 0.8 10*3/uL (ref 0.1–1.0)
Monocytes Relative: 14 %
Neutro Abs: 2.7 10*3/uL (ref 1.7–7.7)
Neutrophils Relative %: 46 %
Platelets: 220 10*3/uL (ref 150–400)
RBC: 5.45 MIL/uL — ABNORMAL HIGH (ref 3.87–5.11)
RDW: 14.6 % (ref 11.5–15.5)
WBC: 5.8 10*3/uL (ref 4.0–10.5)
nRBC: 0 % (ref 0.0–0.2)

## 2021-12-11 LAB — BASIC METABOLIC PANEL
Anion gap: 6 (ref 5–15)
Anion gap: 2 — ABNORMAL LOW (ref 5–15)
BUN: 11 mg/dL (ref 8–23)
BUN: 14 mg/dL (ref 8–23)
CO2: 26 mmol/L (ref 22–32)
CO2: 24 mmol/L (ref 22–32)
Calcium: 8 mg/dL — ABNORMAL LOW (ref 8.9–10.3)
Calcium: 8 mg/dL — ABNORMAL LOW (ref 8.9–10.3)
Chloride: 115 mmol/L — ABNORMAL HIGH (ref 98–111)
Chloride: 115 mmol/L — ABNORMAL HIGH (ref 98–111)
Creatinine, Ser: 0.71 mg/dL (ref 0.44–1.00)
Creatinine, Ser: 0.68 mg/dL (ref 0.44–1.00)
GFR, Estimated: >60 mL/min (ref >60)
GFR, Estimated: >60 mL/min (ref >60)
Glucose, Bld: 108 mg/dL — ABNORMAL HIGH (ref 70–99)
Glucose, Bld: 121 mg/dL — ABNORMAL HIGH (ref 70–99)
Potassium: 3.6 mmol/L (ref 3.5–5.1)
Potassium: 3.1 mmol/L — ABNORMAL LOW (ref 3.5–5.1)
Sodium: 147 mmol/L — ABNORMAL HIGH (ref 135–145)
Sodium: 141 mmol/L (ref 135–145)

## 2021-12-11 LAB — RAPID URINE DRUG SCREEN, HOSP PERFORMED
Amphetamines: NOT DETECTED
Barbiturates: NOT DETECTED
Benzodiazepines: NOT DETECTED
Cocaine: NOT DETECTED
Opiates: NOT DETECTED
Tetrahydrocannabinol: NOT DETECTED

## 2021-12-11 LAB — COMPREHENSIVE METABOLIC PANEL
ALT: 22 U/L (ref 0–44)
AST: 30 U/L (ref 15–41)
Albumin: 3.4 g/dL — ABNORMAL LOW (ref 3.5–5.0)
Alkaline Phosphatase: 58 U/L (ref 38–126)
Anion gap: 8 (ref 5–15)
BUN: 14 mg/dL (ref 8–23)
CO2: 27 mmol/L (ref 22–32)
Calcium: 8.6 mg/dL — ABNORMAL LOW (ref 8.9–10.3)
Chloride: 109 mmol/L (ref 98–111)
Creatinine, Ser: 0.84 mg/dL (ref 0.44–1.00)
GFR, Estimated: >60 mL/min (ref >60)
Glucose, Bld: 102 mg/dL — ABNORMAL HIGH (ref 70–99)
Potassium: 2.7 mmol/L — CL (ref 3.5–5.1)
Sodium: 144 mmol/L (ref 135–145)
Total Bilirubin: 0.8 mg/dL (ref 0.3–1.2)
Total Protein: 7 g/dL (ref 6.5–8.1)

## 2021-12-11 LAB — TROPONIN I (HIGH SENSITIVITY)
Troponin I (High Sensitivity): 30 ng/L — ABNORMAL HIGH (ref <18)
Troponin I (High Sensitivity): 38 ng/L — ABNORMAL HIGH (ref <18)

## 2021-12-11 LAB — URINALYSIS, ROUTINE W REFLEX MICROSCOPIC
Bilirubin Urine: NEGATIVE
Glucose, UA: NEGATIVE mg/dL
Ketones, ur: 5 mg/dL — AB
Nitrite: POSITIVE — AB
Protein, ur: 30 mg/dL — AB
Specific Gravity, Urine: 1.021 (ref 1.005–1.030)
pH: 6 (ref 5.0–8.0)

## 2021-12-11 LAB — MAGNESIUM: Magnesium: 2 mg/dL (ref 1.7–2.4)

## 2021-12-11 LAB — T4, FREE: Free T4: 1.15 ng/dL — ABNORMAL HIGH (ref 0.61–1.12)

## 2021-12-11 LAB — TSH: TSH: 5.399 u[IU]/mL — ABNORMAL HIGH (ref 0.350–4.500)

## 2021-12-11 MED ORDER — ACETAMINOPHEN 650 MG RE SUPP: 650.0000 mg | Freq: Four times a day (QID) | RECTAL | Status: DC | PRN

## 2021-12-11 MED ORDER — POTASSIUM CHLORIDE CRYS ER 20 MEQ PO TBCR
40.0000 meq | EXTENDED_RELEASE_TABLET | Freq: Once | ORAL | Status: AC
  Administered 2021-12-11: 40 meq via ORAL
  Filled 2021-12-11: qty 2

## 2021-12-11 MED ORDER — MONTELUKAST SODIUM 10 MG PO TABS
10.0000 mg | ORAL_TABLET | Freq: Every day | ORAL | Status: DC
  Administered 2021-12-11 – 2021-12-24 (×8): 10 mg via ORAL
  Filled 2021-12-11 (×14): qty 1

## 2021-12-11 MED ORDER — ACETAMINOPHEN 325 MG PO TABS
650.0000 mg | ORAL_TABLET | Freq: Four times a day (QID) | ORAL | Status: DC | PRN
  Administered 2021-12-14 – 2021-12-16 (×3): 650 mg via ORAL
  Filled 2021-12-11 (×5): qty 2

## 2021-12-11 MED ORDER — DEXTROSE-NACL 5-0.45 % IV SOLN
INTRAVENOUS | Status: AC
Start: 2021-12-11 — End: 2021-12-12

## 2021-12-11 MED ORDER — ONDANSETRON HCL 4 MG PO TABS: 4.0000 mg | ORAL_TABLET | Freq: Four times a day (QID) | ORAL | Status: DC | PRN

## 2021-12-11 MED ORDER — AMLODIPINE BESYLATE 5 MG PO TABS
10.0000 mg | ORAL_TABLET | Freq: Every day | ORAL | Status: DC
  Administered 2021-12-11 – 2021-12-25 (×10): 10 mg via ORAL
  Filled 2021-12-11 (×15): qty 2

## 2021-12-11 MED ORDER — OLANZAPINE 5 MG PO TBDP
5.0000 mg | ORAL_TABLET | Freq: Once | ORAL | Status: AC
  Administered 2021-12-11: 5 mg via ORAL
  Filled 2021-12-11: qty 1

## 2021-12-11 MED ORDER — SERTRALINE HCL 100 MG PO TABS
100.0000 mg | ORAL_TABLET | Freq: Every morning | ORAL | Status: DC
  Administered 2021-12-11 – 2021-12-25 (×10): 100 mg via ORAL
  Filled 2021-12-11 (×15): qty 1

## 2021-12-11 MED ORDER — MOMETASONE FURO-FORMOTEROL FUM 200-5 MCG/ACT IN AERO
2.0000 | INHALATION_SPRAY | Freq: Two times a day (BID) | RESPIRATORY_TRACT | Status: DC
  Administered 2021-12-11 – 2021-12-25 (×11): 2 via RESPIRATORY_TRACT
  Filled 2021-12-11: qty 8.8

## 2021-12-11 MED ORDER — PANTOPRAZOLE SODIUM 40 MG PO TBEC
40.0000 mg | DELAYED_RELEASE_TABLET | Freq: Every day | ORAL | Status: DC
  Administered 2021-12-11 – 2021-12-22 (×7): 40 mg via ORAL
  Filled 2021-12-11 (×12): qty 1

## 2021-12-11 MED ORDER — ENOXAPARIN SODIUM 40 MG/0.4ML IJ SOSY
40.0000 mg | PREFILLED_SYRINGE | INTRAMUSCULAR | Status: DC
  Administered 2021-12-12 – 2021-12-18 (×5): 40 mg via SUBCUTANEOUS
  Filled 2021-12-11 (×9): qty 0.4

## 2021-12-11 MED ORDER — SODIUM CHLORIDE 0.9% FLUSH
3.0000 mL | Freq: Two times a day (BID) | INTRAVENOUS | Status: DC
  Administered 2021-12-11 – 2021-12-25 (×14): 3 mL via INTRAVENOUS

## 2021-12-11 MED ORDER — TRAZODONE HCL 100 MG PO TABS
100.0000 mg | ORAL_TABLET | Freq: Every evening | ORAL | Status: DC | PRN
  Administered 2021-12-12 – 2021-12-14 (×2): 100 mg via ORAL
  Filled 2021-12-11 (×2): qty 1

## 2021-12-11 MED ORDER — ROSUVASTATIN CALCIUM 5 MG PO TABS
5.0000 mg | ORAL_TABLET | Freq: Every day | ORAL | Status: DC
  Administered 2021-12-11 – 2021-12-25 (×9): 5 mg via ORAL
  Filled 2021-12-11 (×16): qty 1

## 2021-12-11 MED ORDER — ONDANSETRON HCL 4 MG/2ML IJ SOLN: 4.0000 mg | Freq: Four times a day (QID) | INTRAMUSCULAR | Status: DC | PRN

## 2021-12-11 MED ORDER — SODIUM CHLORIDE 0.9 % IV SOLN: INTRAVENOUS | Status: AC

## 2021-12-11 MED ORDER — SODIUM CHLORIDE 0.9 % IV SOLN: INTRAVENOUS | Status: DC

## 2021-12-11 MED ORDER — LEVOTHYROXINE SODIUM 50 MCG PO TABS
50.0000 ug | ORAL_TABLET | Freq: Every morning | ORAL | Status: DC
  Administered 2021-12-11 – 2021-12-25 (×10): 50 ug via ORAL
  Filled 2021-12-11 (×17): qty 1

## 2021-12-11 MED ORDER — POTASSIUM CHLORIDE 10 MEQ/100ML IV SOLN
10.0000 meq | Freq: Once | INTRAVENOUS | Status: AC
  Administered 2021-12-11: 10 meq via INTRAVENOUS
  Filled 2021-12-11: qty 100

## 2021-12-11 MED ORDER — ALBUTEROL SULFATE (2.5 MG/3ML) 0.083% IN NEBU: 2.5000 mg | INHALATION_SOLUTION | RESPIRATORY_TRACT | Status: DC | PRN

## 2021-12-11 MED ORDER — POTASSIUM CHLORIDE 10 MEQ/100ML IV SOLN
10.0000 meq | INTRAVENOUS | Status: AC
  Administered 2021-12-11 (×2): 10 meq via INTRAVENOUS
  Filled 2021-12-11 (×2): qty 100

## 2021-12-11 NOTE — Consult Note (Cosign Needed Addendum)
Jackie Garcia is a pleasant 81 y.o. female with medical history significant for COPD, depression, anxiety, dementia, and hypothyroidism who was sent to the emergency department under IVC that was taken out by family after she threatened to kill herself with a knife.  Patient's family reports that the patient has not been eating or drinking, has refused medications for several days, and has had loose stools and incontinence for 2 days.  Family reported that the patient had a life and was threatening to kill herself.  Patient denies that she was not eating or drinking, and also denies any suicidal thoughts.  She reports having 2 loose stools yesterday but denies abdominal pain, vomiting, or fever.  She denies chest pain, shortness of breath, nausea, or diaphoresis.  Psych consult placed for IVC by family who said she had a knife and threatened to kill herself and has not been eating, drinking, or taking her medication.  She was admitted for severe hypokalemia.  Patient is seen, chart reviewed, and case discussed with treatment team.  Writer discussed with patient reason for psychiatric evaluation, while patient does agree she grudgingly participates.  Patient states" I did not try to kill myself.  I do not have a knife to kill myself.  And the reason not eating is because they refused to buy me food my family wants to get rid of me, so they are doing everything they can to get me out of their house."   Patient states she has been residing with her granddaughter for quite some time, and just recently have they begin to forcefully remove her from the home.  She denies any previous psychiatric history to include depression and or suicidal ideations.  Chart review shows patient has a history of dementia, hypothyroidism, significant thyroid disease that likely may have contributed to previous psychiatric symptoms.  Patient continues to remain pleasant yet irritable, as she is having an IV placed.  Per IVC  patient has been diagnosed with dementia, is refusing to eat and not letting anyone help her with her hygiene.  She retrieved a knife and threatened to kill herself, called EMS and agreed to go to the hospital.   Patient recently was discharged from the emergency room 2 hours prior, for not eating and drinking and or caring for herself.  The daughter believes that she is suffering from severe dementia and or psychiatric disease.  There also seems to be some frustration from the family, and would like to seek higher level of care for patient.  Patient has refused to go into a facility, and refuses most medical care evaluations.   Jackie Garcia is a 81 y.o. female patient presented to [facility] via EMS and under IVC. Per triage nurse note, Pt comes into the ED UNDER IVC FOR THREATENING SUICIDAL IDEATION AND REFUSING TO EAT. Pt was IVC'ed by her daughter who states the patient has a dementia and depression.  Patient presented to the emergency room, was discharged home returned a few hours later under IVC, after allegedly holding a knife to her neck saying she was going to kill herself.  Patient is denying holding a knife to her neck, and or endorsing suicidal ideations.   This provider saw the patient face-to-face; the chart was reviewed.  It has been discussed with clinical team the patient does not meet criteria for inpatient psychiatric admission.  On evaluation, the patient is alert and oriented x3, irritated but cooperative, mood congruent with her affect.  The patient does not  appear to be responding to internal and or external stimuli.  The patient does not present with delusional thinking.  The patient denies suicidal ideations, homicidal ideations, and or auditory or visual hallucinations.  The patient is not presenting with any evidence of psychosis, paranoid behaviors.  During this encounter she could answer questions appropriately, had a linear conversation.   At the time of this brief psychiatric  evaluation, patient has agreed to remain in the hospital and participate in medical treatment and planning.  She is also eating and drinking appropriately.  She does not show any acute and or appear to be of imminent risk to self and or others at this time.  As per nursing staff she has been pleasant and cooperative with medication compliance.  It should also be noted that patient does not have enough strength to leave the hospital, and will not need to be IVC at this time.  In the event patient is threatening to leave would advise to redirect her whenever possible, given that she does not appear to process and retain information as expected due to her current cognitive impairment and underlying dementia.  -May continue safety sitter, as patient is high risk for developing delirium. -Continue current Zoloft for treatment of depression. -Consider TOC consult for referral for higher level of care, and or home health aide. -Rescind IVC, as patient is not a danger to herself or others.  She has adamantly denied any suicidal ideation, and or suicidal gestures by holding a knife to her neck. -Psychiatry consult service to sign off at this time.

## 2021-12-11 NOTE — ED Notes (Signed)
Attempted to obtain set of vitals on patient, she continues to refuse to allow for placement of BP cuff or pulse ox. Will continue to monitor patient.

## 2021-12-11 NOTE — Plan of Care (Signed)

## 2021-12-11 NOTE — Progress Notes (Signed)
Subjective: Patient admitted this morning, see detailed H&P by Dr Antionette Char 81 year old female with medical history of COPD, depression, anxiety, dementia, hypothyroidism was sent to the ED under IVC after she threatened to kill herself with a knife.  As per family patient has not been eating and drinking and refuse medications for several days.  She told family that she will kill herself. Patient was brought to the ED and found to have hypokalemia  Vitals:   12/11/21 1150 12/11/21 1601  BP: 119/63 (!) 124/59  Pulse: (!) 59 61  Resp: 16 16  Temp: 98 F (36.7 C) 98.5 F (36.9 C)  SpO2: 96% 90%      A/P  Hypokalemia -Replete  Suicidal ideation -IVC  -Psych was consulted and IVC rescinded -Continue one-to-one observation -Patient is not suicidal as per psych  Atrial fibrillation -EKG showed atrial fibrillation with heart rate 47 -TSH 5.399 -We will obtain echocardiogram  Troponin elevation -Patient had mild troponin elevation -No chest pain or shortness of breath -Follow echo report  COPD -No coughing or wheezing noted -Continue ICS/LABA, as needed albuterol  Hypertension -Continue Norvasc  Hypothyroidism -Continue Synthroid -TSH 5.399   Meredeth Ide [facility]

## 2021-12-11 NOTE — Progress Notes (Signed)
New pt arrived to floor from ED.  Pt currently calm, relaxed, and cooperative. Safety sitter at bedside.

## 2021-12-11 NOTE — Progress Notes (Signed)
Pt's daughter [name] notified floor nurse of concerns over pt's health the past few weeks.  Advised concerns would be mentioned to MD and would follow up accordingly.
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
## 2021-12-11 NOTE — ED Notes (Addendum)
Now under IVC order. Pt changed into burgundy scrubs with non slip socks. Pt's shirt, pants, shoes, and socks placed into pt belonging bag and stored above 9-12 nurses station.
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
## 2021-12-11 NOTE — Progress Notes (Signed)
MD order potassium run and last bag given late upon arrival to floor.   Initiated at 1202 and then stopped 20 minutes later d/t infiltration.  Restarted at 1800 and had to be slowed down to 41mL/hr d/t "burning" complaint from pt.   1830 Refused the rest of the potassium IVPB; 16mL still left infusing. Stopped potassium and infused saline.

## 2021-12-11 NOTE — ED Notes (Signed)
ED TO INPATIENT HANDOFF REPORT  ED Nurse Name and Phone #: [phone] Erick Colace, RN   S Name/Age/Gender Genevive Bi 81 y.o. female Room/Bed: WA12/WA12  Code Status   Code Status: Prior  Home/SNF/Other ** Patient oriented to: self, place, and situation Is this baseline? Yes   Triage Complete: Triage complete  Chief Complaint Hypokalemia [E87.6]  Triage Note Pt BIB EMS from home , sent to ED by family for refusal to take meds x 5 days and diarrhea x 2 days. Family reports pt is using the bathroom on herself which is abnormal.   EMS vitals: BP 120/80 HR 60 SPO2 96% 3 L  CBG 152    Allergies Allergies  Allergen Reactions   Naproxen Swelling   Sulfa Antibiotics Rash   Codeine Itching    Level of Care/Admitting Diagnosis ED Disposition     ED Disposition  Admit   Condition  --   Comment  Hospital Area: [place] [[id number]]  Level of Care: Telemetry [5]  Admit to tele based on following criteria: Monitor QTC interval  May place patient in observation at [facility] or [facility] if equivalent level of care is available:: Yes  Covid Evaluation: Asymptomatic - no recent exposure (last 10 days) testing not required  Diagnosis: Hypokalemia [id number]  Admitting Physician: Vianne Bulls [id number]  Attending Physician: Vianne Bulls [id number]          B Medical/Surgery History Past Medical History:  Diagnosis Date   Emphysema lung ([place])    Family history of adverse reaction to anesthesia    Daughter - PONV   GERD (gastroesophageal reflux disease)    Hypothyroidism    Thyroid disease    Past Surgical History:  Procedure Laterality Date   MYRINGOTOMY WITH TUBE PLACEMENT Left 06/06/2021   Procedure: LEFT MYRINGOTOMY WITH BUTTERFLY TUBE PLACEMENT;  Surgeon: Margaretha Sheffield, MD;  Location: [place];  Service: ENT;  Laterality: Left;   TUBAL LIGATION       A IV Location/Drains/Wounds Patient Lines/Drains/Airways  Status     Active Line/Drains/Airways     Name Placement date Placement time Site Days   Peripheral IV 12/11/21 22 G Left;Lateral;Proximal Forearm 12/11/21  0550  Forearm  less than 1            Intake/Output Last 24 hours No intake or output data in the 24 hours ending 12/11/21 0553  Labs/Imaging Results for orders placed or performed during the hospital encounter of 12/10/21 (from the past 48 hour(s))  Comprehensive metabolic panel     Status: Abnormal   Collection Time: 12/11/21  3:59 AM  Result Value Ref Range   Sodium 144 135 - 145 mmol/L   Potassium 2.7 (LL) 3.5 - 5.1 mmol/L    Comment: CRITICAL RESULT CALLED TO, READ BACK BY AND VERIFIED WITH:  [name] DAVIS RN RBV 12/11/21 @ 0438 VS    Chloride 109 98 - 111 mmol/L   CO2 27 22 - 32 mmol/L   Glucose, Bld 102 (H) 70 - 99 mg/dL    Comment: Glucose reference range applies only to samples taken after fasting for at least 8 hours.   BUN 14 8 - 23 mg/dL   Creatinine, Ser 0.84 0.44 - 1.00 mg/dL   Calcium 8.6 (L) 8.9 - 10.3 mg/dL   Total Protein 7.0 6.5 - 8.1 g/dL   Albumin 3.4 (L) 3.5 - 5.0 g/dL   AST 30 15 - 41 U/L   ALT 22 0 -  44 U/L   Alkaline Phosphatase 58 38 - 126 U/L   Total Bilirubin 0.8 0.3 - 1.2 mg/dL   GFR, Estimated >60 >60 mL/min    Comment: (NOTE) Calculated using the CKD-EPI Creatinine Equation (2021)    Anion gap 8 5 - 15    Comment: Performed at [facility], [place] [address], [place], [place] [address]  CBC with Differential     Status: Abnormal   Collection Time: 12/11/21  3:59 AM  Result Value Ref Range   WBC 5.8 4.0 - 10.5 K/uL   RBC 5.45 (H) 3.87 - 5.11 MIL/uL   Hemoglobin 15.4 (H) 12.0 - 15.0 g/dL   HCT 47.1 (H) 36.0 - 46.0 %   MCV 86.4 80.0 - 100.0 fL   MCH 28.3 26.0 - 34.0 pg   MCHC 32.7 30.0 - 36.0 g/dL   RDW 14.6 11.5 - 15.5 %   Platelets 220 150 - 400 K/uL   nRBC 0.0 0.0 - 0.2 %   Neutrophils Relative % 46 %   Neutro Abs 2.7 1.7 - 7.7 K/uL   Lymphocytes Relative 36 %    Lymphs Abs 2.1 0.7 - 4.0 K/uL   Monocytes Relative 14 %   Monocytes Absolute 0.8 0.1 - 1.0 K/uL   Eosinophils Relative 3 %   Eosinophils Absolute 0.2 0.0 - 0.5 K/uL   Basophils Relative 1 %   Basophils Absolute 0.0 0.0 - 0.1 K/uL   Immature Granulocytes 0 %   Abs Immature Granulocytes 0.01 0.00 - 0.07 K/uL    Comment: Performed at [facility], [place] [address] [place], [place] [address]  Troponin I (High Sensitivity)     Status: Abnormal   Collection Time: 12/11/21  3:59 AM  Result Value Ref Range   Troponin I (High Sensitivity) 30 (H) <18 ng/L    Comment: (NOTE) Elevated high sensitivity troponin I (hsTnI) values and significant  changes across serial measurements may suggest ACS but many other  chronic and acute conditions are known to elevate hsTnI results.  Refer to the "Links" section for chest pain algorithms and additional  guidance. Performed at [facility], [place] [address], [place], [place] [address]   Magnesium     Status: None   Collection Time: 12/11/21  3:59 AM  Result Value Ref Range   Magnesium 2.0 1.7 - 2.4 mg/dL    Comment: Performed at [facility], [place] [address] [place], [place] [address]   DG Chest Port 1 View  Result Date: 12/11/2021 CLINICAL DATA:  Abnormal EKG.  Psychiatric patient EXAM: PORTABLE CHEST 1 VIEW COMPARISON:  10/29/2021 FINDINGS: Borderline heart size with diffuse interstitial coarsening which has been seen on priors. No definite Kerley lines, the left lung base is partially seen. No visible effusion or pneumothorax. IMPRESSION: 1. No acute finding when compared to priors. 2. Chronic lung disease and borderline heart size. Electronically Signed   By: Jorje Guild M.D.   On: 12/11/2021 05:16    Pending Labs Unresulted Labs (From admission, onward)     Start     Ordered   12/11/21 0258  Urinalysis, Routine w reflex microscopic  Once,   URGENT        12/11/21 0258   12/11/21 0258   Urine rapid drug screen (hosp performed)  Once,   STAT        12/11/21 0258            Vitals/Pain Today's Vitals   12/10/21  2012 12/10/21 2015 12/11/21 0355 12/11/21 0521  BP: (!) 113/50  118/62   Pulse: (!) 54 62 (!) 46 (!) 48  Resp:   18 20  Temp:   98 F (36.7 C)   TempSrc:      SpO2: 94% 96% 95% 92%  Weight:      Height:        Isolation Precautions No active isolations  Medications Medications  0.9 %  sodium chloride infusion ( Intravenous New Bag/Given 12/11/21 0552)  potassium chloride 10 mEq in 100 mL IVPB (10 mEq Intravenous New Bag/Given 12/11/21 0551)  OLANZapine zydis (ZYPREXA) disintegrating tablet 5 mg (5 mg Oral Given 12/11/21 0406)  potassium chloride SA (KLOR-CON M) CR tablet 40 mEq (40 mEq Oral Given 12/11/21 0509)    Mobility walks with device High fall risk   Focused Assessments    R Recommendations: See Admitting Provider Note  Report given to:   Additional Notes:
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
## 2021-12-12 ENCOUNTER — Observation Stay (HOSPITAL_COMMUNITY): Payer: Medicare HMO

## 2021-12-12 DIAGNOSIS — Z885 Allergy status to narcotic agent status: Secondary | ICD-10-CM | POA: Diagnosis not present

## 2021-12-12 DIAGNOSIS — E860 Dehydration: Secondary | ICD-10-CM | POA: Diagnosis present

## 2021-12-12 DIAGNOSIS — Z7189 Other specified counseling: Secondary | ICD-10-CM | POA: Diagnosis not present

## 2021-12-12 DIAGNOSIS — Z79890 Hormone replacement therapy (postmenopausal): Secondary | ICD-10-CM | POA: Diagnosis not present

## 2021-12-12 DIAGNOSIS — I4891 Unspecified atrial fibrillation: Secondary | ICD-10-CM | POA: Diagnosis not present

## 2021-12-12 DIAGNOSIS — R778 Other specified abnormalities of plasma proteins: Secondary | ICD-10-CM | POA: Diagnosis not present

## 2021-12-12 DIAGNOSIS — E785 Hyperlipidemia, unspecified: Secondary | ICD-10-CM | POA: Diagnosis present

## 2021-12-12 DIAGNOSIS — Z79899 Other long term (current) drug therapy: Secondary | ICD-10-CM | POA: Diagnosis not present

## 2021-12-12 DIAGNOSIS — I480 Paroxysmal atrial fibrillation: Secondary | ICD-10-CM

## 2021-12-12 DIAGNOSIS — R45851 Suicidal ideations: Secondary | ICD-10-CM | POA: Diagnosis present

## 2021-12-12 DIAGNOSIS — Z91199 Patient's noncompliance with other medical treatment and regimen due to unspecified reason: Secondary | ICD-10-CM | POA: Diagnosis not present

## 2021-12-12 DIAGNOSIS — F0393 Unspecified dementia, unspecified severity, with mood disturbance: Secondary | ICD-10-CM | POA: Diagnosis present

## 2021-12-12 DIAGNOSIS — K219 Gastro-esophageal reflux disease without esophagitis: Secondary | ICD-10-CM | POA: Diagnosis present

## 2021-12-12 DIAGNOSIS — F03918 Unspecified dementia, unspecified severity, with other behavioral disturbance: Secondary | ICD-10-CM | POA: Diagnosis present

## 2021-12-12 DIAGNOSIS — Z833 Family history of diabetes mellitus: Secondary | ICD-10-CM | POA: Diagnosis not present

## 2021-12-12 DIAGNOSIS — Z7951 Long term (current) use of inhaled steroids: Secondary | ICD-10-CM | POA: Diagnosis not present

## 2021-12-12 DIAGNOSIS — J439 Emphysema, unspecified: Secondary | ICD-10-CM | POA: Diagnosis present

## 2021-12-12 DIAGNOSIS — Z886 Allergy status to analgesic agent status: Secondary | ICD-10-CM | POA: Diagnosis not present

## 2021-12-12 DIAGNOSIS — F319 Bipolar disorder, unspecified: Secondary | ICD-10-CM | POA: Diagnosis present

## 2021-12-12 DIAGNOSIS — Z882 Allergy status to sulfonamides status: Secondary | ICD-10-CM | POA: Diagnosis not present

## 2021-12-12 DIAGNOSIS — R627 Adult failure to thrive: Secondary | ICD-10-CM | POA: Diagnosis present

## 2021-12-12 DIAGNOSIS — Z0000 Encounter for general adult medical examination without abnormal findings: Secondary | ICD-10-CM | POA: Diagnosis not present

## 2021-12-12 DIAGNOSIS — E039 Hypothyroidism, unspecified: Secondary | ICD-10-CM | POA: Diagnosis present

## 2021-12-12 DIAGNOSIS — Z801 Family history of malignant neoplasm of trachea, bronchus and lung: Secondary | ICD-10-CM | POA: Diagnosis not present

## 2021-12-12 DIAGNOSIS — J449 Chronic obstructive pulmonary disease, unspecified: Secondary | ICD-10-CM | POA: Diagnosis not present

## 2021-12-12 DIAGNOSIS — Z515 Encounter for palliative care: Secondary | ICD-10-CM | POA: Diagnosis not present

## 2021-12-12 DIAGNOSIS — E876 Hypokalemia: Secondary | ICD-10-CM | POA: Diagnosis present

## 2021-12-12 DIAGNOSIS — F17210 Nicotine dependence, cigarettes, uncomplicated: Secondary | ICD-10-CM | POA: Diagnosis present

## 2021-12-12 DIAGNOSIS — Z6833 Body mass index (BMI) 33.0-33.9, adult: Secondary | ICD-10-CM | POA: Diagnosis not present

## 2021-12-12 DIAGNOSIS — I10 Essential (primary) hypertension: Secondary | ICD-10-CM | POA: Diagnosis present

## 2021-12-12 LAB — ECHOCARDIOGRAM COMPLETE
AR max vel: 2.27 cm2
AV Area VTI: 2.03 cm2
AV Area mean vel: 2.19 cm2
AV Mean grad: 5 mmHg
AV Peak grad: 8.8 mmHg
Ao pk vel: 1.48 m/s
Area-P 1/2: 2.68 cm2
Calc EF: 49.5 %
Height: 62 in
S' Lateral: 2.7 cm
Single Plane A2C EF: 52.4 %
Single Plane A4C EF: 51.7 %
Weight: 2931.24 oz

## 2021-12-12 MED ORDER — POTASSIUM CHLORIDE 10 MEQ/100ML IV SOLN
10.0000 meq | INTRAVENOUS | Status: DC
  Administered 2021-12-12: 10 meq via INTRAVENOUS
  Filled 2021-12-12: qty 100

## 2021-12-12 MED ORDER — DEXTROSE-NACL 5-0.45 % IV SOLN: INTRAVENOUS | Status: AC

## 2021-12-12 MED ORDER — SODIUM CHLORIDE 0.9 % IV SOLN
1.0000 g | INTRAVENOUS | Status: DC
  Administered 2021-12-12 – 2021-12-13 (×2): 1 g via INTRAVENOUS
  Filled 2021-12-12 (×3): qty 10

## 2021-12-12 MED ORDER — DIVALPROEX SODIUM 250 MG PO DR TAB
250.0000 mg | DELAYED_RELEASE_TABLET | Freq: Two times a day (BID) | ORAL | Status: DC
  Administered 2021-12-12 – 2021-12-16 (×6): 250 mg via ORAL
  Filled 2021-12-12 (×10): qty 1

## 2021-12-12 MED ORDER — POTASSIUM CHLORIDE CRYS ER 20 MEQ PO TBCR
40.0000 meq | EXTENDED_RELEASE_TABLET | ORAL | Status: AC
  Administered 2021-12-12: 40 meq via ORAL
  Filled 2021-12-12: qty 2

## 2021-12-12 NOTE — Progress Notes (Signed)
MD signed commitment change order to rescind IVC. CSW faxed form to [facility] office.   1455: CSW called magistrates office and confirmed they received form.

## 2021-12-12 NOTE — Progress Notes (Signed)
Echocardiogram 2D Echocardiogram has been performed.  Jackie Garcia 12/12/2021, 11:47 AM

## 2021-12-12 NOTE — Plan of Care (Signed)

## 2021-12-12 NOTE — Progress Notes (Signed)
Pt refused all oral scheduled meds. MD [place] notified.
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
## 2021-12-13 DIAGNOSIS — E876 Hypokalemia: Secondary | ICD-10-CM | POA: Diagnosis not present

## 2021-12-13 DIAGNOSIS — F03918 Unspecified dementia, unspecified severity, with other behavioral disturbance: Secondary | ICD-10-CM | POA: Diagnosis not present

## 2021-12-13 DIAGNOSIS — R778 Other specified abnormalities of plasma proteins: Secondary | ICD-10-CM | POA: Diagnosis not present

## 2021-12-13 DIAGNOSIS — I480 Paroxysmal atrial fibrillation: Secondary | ICD-10-CM | POA: Diagnosis not present

## 2021-12-13 DIAGNOSIS — I10 Essential (primary) hypertension: Secondary | ICD-10-CM | POA: Diagnosis not present

## 2021-12-13 LAB — BASIC METABOLIC PANEL
Anion gap: 6 (ref 5–15)
BUN: <5 mg/dL — ABNORMAL LOW (ref 8–23)
CO2: 22 mmol/L (ref 22–32)
Calcium: 8.4 mg/dL — ABNORMAL LOW (ref 8.9–10.3)
Chloride: 115 mmol/L — ABNORMAL HIGH (ref 98–111)
Creatinine, Ser: 0.59 mg/dL (ref 0.44–1.00)
GFR, Estimated: >60 mL/min (ref >60)
Glucose, Bld: 120 mg/dL — ABNORMAL HIGH (ref 70–99)
Potassium: 3.3 mmol/L — ABNORMAL LOW (ref 3.5–5.1)
Sodium: 143 mmol/L (ref 135–145)

## 2021-12-13 LAB — URINE CULTURE: Culture: NO GROWTH

## 2021-12-13 MED ORDER — POTASSIUM CHLORIDE CRYS ER 20 MEQ PO TBCR
40.0000 meq | EXTENDED_RELEASE_TABLET | Freq: Once | ORAL | Status: AC
  Administered 2021-12-13: 40 meq via ORAL
  Filled 2021-12-13: qty 2

## 2021-12-13 NOTE — TOC Initial Note (Addendum)
Transition of Care [facility]) - Initial/Assessment Note    Patient Details  Name: Jackie Garcia MRN: [id number] Date of Birth: June 13, 1941  [facility] CM/SW Contact:    Bethann Berkshire, [place] Phone Number: 12/13/2021, 1:13 PM  Clinical Narrative:                  CSW is notified by MD that pt is unsafe to return home with family and that family is wanting placement for pt, possibly memory care. Pt does not have LTC insurance so LTC placement would be out of pocket. Pt only has insurance coverage for short term rehab at SNF if appropriate. PT still pending at this time.   CSW called pts daughter, Jackie Garcia, who provided history. States she has been taking care of pt for past 13 years though that her health has "gone downhill." She states pt has been argumentative and hostile at home, refusing to go to appointments. She states pt moved in with pt's grandaughter. Jackie Garcia along with stacey's husband and 3 children but they are unable to care for pt in pt's current state. CSW explained pt's medicare will only cover short term rehab at [facility]. CSW explained any LTC would require out of pocket payment. Pt receives 1800$/month in SSI/retirement which would not be enough for memory care. Daughter is agreeable to short term rehab if recommended by PT. She states family would likely be willing to take pt back if her mentation were to stabilize. Daughter indicates that they have necessary equipment at home. Daughter is agreeable to SNF if it were recommended and understands this would only be short term.   1415: PT recommending SNF. CSW met with pt who is pleasant. She provided limited info regarding living with her grandaughter; she explained it wasn't a good situation and mentioned dissatisfaction with food but doesn't go into further detail. CSW explains SNF recommendation. Pt is agreeable to SNF. CSW explains SNF workup and insurance auth process. Pt has no additional questions.   15:22: FL-2  completed. Bed search initiated.   Expected Discharge Plan:  (Undetermined) Barriers to Discharge: Family Issues, Unsafe home situation   Patient Goals and CMS Choice        Expected Discharge Plan and Services Expected Discharge Plan:  (Undetermined)                                              Prior Living Arrangements/Services                       Activities of Daily Living Home Assistive Devices/Equipment: Cane (specify quad or straight), Other (Comment) ADL Screening (condition at time of admission) Patient's cognitive ability adequate to safely complete daily activities?: Yes Is the patient deaf or have difficulty hearing?: Yes Does the patient have difficulty seeing, even when wearing glasses/contacts?: No Does the patient have difficulty concentrating, remembering, or making decisions?: Yes Patient able to express need for assistance with ADLs?: No Does the patient have difficulty dressing or bathing?: No Independently performs ADLs?: Yes (appropriate for developmental age) Does the patient have difficulty walking or climbing stairs?: Yes Weakness of Legs: None Weakness of Arms/Hands: None  Permission Sought/Granted                  Emotional Assessment  Admission diagnosis:  Hypokalemia [E87.6] General medical exam [Z00.00] Dementia with behavioral disturbance ([place]) [F03.918] Dehydration [E86.0] Patient Active Problem List   Diagnosis Date Noted   Dehydration 12/12/2021   Hypokalemia 12/11/2021   Elevated troponin 12/11/2021   Essential hypertension 11/07/2021   Dyslipidemia 11/07/2021   Depression 11/07/2021   Hypothyroidism 10/08/2020   COPD (chronic obstructive pulmonary disease) ([place]) 10/07/2020   Tobacco abuse 10/07/2020   Tobacco use disorder 10/07/2020   PCP:  Pcp, No Pharmacy:   [id number] [id number]: [phone]:  [phone]    Social Determinants of Health (SDOH) Interventions    Readmission Risk Interventions     View : No data to display.

## 2021-12-13 NOTE — Consult Note (Addendum)
Cardiology Consultation:   Patient ID: Jackie Garcia MRN: [id number]; DOB: 11-04-1940  Admit date: 12/10/2021 Date of Consult: 12/13/2021  PCP:  Merryl Hacker, No   [facility] Providers Cardiologist:  New to [facility]     Patient Profile:   Jackie Garcia is a 81 y.o. female with a hx of COPD, anxiety/depression, dementia, and hypothyroidism who is being seen 12/13/2021 for the evaluation of possible atrial fibrillation at the request of Dr. Darrick Meigs.  History of Present Illness:   Jackie Garcia is a 81 year old female with past medical history of COPD, anxiety/depression, dementia, and hypothyroidism.  According to the patient, she has never been seen by cardiologist in the past.  She never drank any alcohol.  She says she quit smoking last week.  Patient was seen in the hospital in March and October 2022 for COPD exacerbation.  She was admitted in [place] in March 2023 due to hypoxia secondary to COPD exacerbation in March 2023.  According to hospital record, hospital course was complicated by dementia with agitation with underlying mood disorder.  However patient refused to see psychiatry at the time.  TSH was elevated and was felt to be related to medication noncompliance.  EKG at the time was reportedly in sinus rhythm.  Patient was admitted to [facility] in April 2023 due to suicidal ideation and was involuntarily committed (IVC) to the hospital.  Psychiatry was consulted who felt the patient was not a danger to herself or others.  IVC was discontinued.  She was also treated for community-acquired pneumonia and a COPD exacerbation with steroid and antibiotic.  Ultimately, patient was discharged home to live with granddaughter.  EKG obtained during hospitalization showed sinus bradycardia with PACs.  Patient was involuntarily committed again back to the hospital on 12/11/2021 after threatening to kill herself with a knife.  Per family, patient has not been eating or drinking and  refused medication for several days and had loose stool and incontinence for 2 days.  Initial EKG showed atrial fibrillation with heart rate 47.  Potassium of 2.7, troponin mildly elevated at 30 --> 38, hemoglobin 15.4.  Patient was seen by psychiatry service, however during the interview, she denied holding a knife to her neck or endorse any suicidal ideation, therefore IVC was removed and patient was felt not to be a danger to herself or others.  Psychiatry signing off.  Hypokalemia was repleted.  Echocardiogram obtained on 12/12/2021 demonstrated EF 65 to 70%, no regional wall motion abnormality, grade 1 DD, RVSP 19.8 mmHg, trivial MR.  Cardiology service consulted for atrial fibrillation.    Past Medical History:  Diagnosis Date   Emphysema lung (HCC)    Family history of adverse reaction to anesthesia    Daughter - PONV   GERD (gastroesophageal reflux disease)    Hypothyroidism    Thyroid disease     Past Surgical History:  Procedure Laterality Date   MYRINGOTOMY WITH TUBE PLACEMENT Left 06/06/2021   Procedure: LEFT MYRINGOTOMY WITH BUTTERFLY TUBE PLACEMENT;  Surgeon: Margaretha Sheffield, MD;  Location: [place];  Service: ENT;  Laterality: Left;   TUBAL LIGATION       Home Medications:  Prior to Admission medications   Medication Sig Start Date End Date Taking? Authorizing Provider  albuterol (VENTOLIN HFA) 108 (90 Base) MCG/ACT inhaler Inhale 2 puffs into the lungs every 4 (four) hours as needed for wheezing or shortness of breath. 04/20/21  Yes Melynda Ripple, MD  amLODipine (NORVASC) 10 MG tablet Take  10 mg by mouth daily. 10/16/21  Yes [provider]  levothyroxine (SYNTHROID) 50 MCG tablet Take 50 mcg by mouth in the morning. 08/26/21  Yes [provider]  sertraline (ZOLOFT) 100 MG tablet Take 100 mg by mouth every morning. 08/19/21 08/19/22 Yes [provider]  SYMBICORT 160-4.5 MCG/ACT inhaler Inhale 2 puffs into the lungs 2 (two) times daily.  05/27/21  Yes [provider]  traZODone (DESYREL) 100 MG tablet Take 100 mg by mouth at bedtime as needed. 08/18/21  Yes [provider]  [employer] PURELAX 17 g packet Take 17 g by mouth daily. Patient not taking: Reported on 12/11/2021 10/16/21   [provider]  [employer] SENNA 8.6 MG tablet Take 2 tablets by mouth at bedtime. Patient not taking: Reported on 12/11/2021 10/16/21   [provider]  montelukast (SINGULAIR) 10 MG tablet Take 10 mg by mouth daily. Patient not taking: Reported on 12/11/2021 08/19/21   [provider]  Multiple Vitamin (MULTIVITAMIN PO) Take 1 tablet by mouth daily. Patient not taking: Reported on 12/11/2021    [provider]  omeprazole (PRILOSEC) 20 MG capsule Take 20 mg by mouth daily. Patient not taking: Reported on 12/11/2021 05/08/20   [provider]  rosuvastatin (CRESTOR) 5 MG tablet Take 5 mg by mouth in the morning. Patient not taking: Reported on 12/11/2021 09/19/21   [provider]  Spacer/Aero-Holding Chambers (AEROCHAMBER PLUS) inhaler Use with inhaler 04/20/21   Domenick Gong, MD    Inpatient Medications: Scheduled Meds:  amLODipine  10 mg Oral Daily   divalproex  250 mg Oral Q12H   enoxaparin (LOVENOX) injection  40 mg Subcutaneous Q24H   levothyroxine  50 mcg Oral q AM   mometasone-formoterol  2 puff Inhalation BID   montelukast  10 mg Oral QHS   pantoprazole  40 mg Oral Daily   rosuvastatin  5 mg Oral Daily   sertraline  100 mg Oral q morning   sodium chloride flush  3 mL Intravenous Q12H   Continuous Infusions:  cefTRIAXone (ROCEPHIN)  IV Stopped (12/12/21 1938)   dextrose 5 % and 0.45% NaCl 75 mL/hr at 12/13/21 0604   PRN Meds: acetaminophen **OR** acetaminophen, albuterol, ondansetron **OR** ondansetron (ZOFRAN) IV, traZODone  Allergies:    Allergies  Allergen Reactions   Naproxen Swelling   Sulfa Antibiotics Rash   Codeine Itching    Social History:   Social History    Socioeconomic History   Marital status: Divorced    Spouse name: Not on file   Number of children: Not on file   Years of education: Not on file   Highest education level: Not on file  Occupational History   Not on file  Tobacco Use   Smoking status: Every Day    Packs/day: 0.10    Years: 40.00    Pack years: 4.00    Types: Cigarettes   Smokeless tobacco: Never   Tobacco comments:    Started smoking in her "27s".  Was 1 PPD.  Now down to 1-2 cigs/day.  Substance and Sexual Activity   Alcohol use: Never   Drug use: Never   Sexual activity: Not on file  Other Topics Concern   Not on file  Social History Narrative   Not on file   Social Determinants of Health   Financial Resource Strain: Not on file  Food Insecurity: Not on file  Transportation Needs: Not on file  Physical Activity: Not on file  Stress: Not on file  Social Connections: Not on file  Intimate Partner Violence: Not on file    Family History:    Family History  Problem Relation Age of Onset   Lung cancer Father    Diabetes Mellitus II Sister      ROS:  Please see the history of present illness.   All other ROS reviewed and negative.     Physical Exam/Data:   Vitals:   12/12/21 1424 12/12/21 2013 12/13/21 0610 12/13/21 0726  BP: (!) 141/72 139/62 136/64   Pulse: (!) 54 66 60   Resp: 16 18 18    Temp: 98.4 F (36.9 C) 97.9 F (36.6 C) 98.1 F (36.7 C)   TempSrc: Oral Oral Oral   SpO2: 94% 94% (!) 89% 90%  Weight:      Height:        Intake/Output Summary (Last 24 hours) at 12/13/2021 0841 Last data filed at 12/13/2021 [id number] Gross per 24 hour  Intake 1390.62 ml  Output 850 ml  Net 540.62 ml      12/10/2021    8:01 PM 11/07/2021    7:44 PM 11/06/2021    2:52 PM  Last 3 Weights  Weight (lbs) 183 lb 3.2 oz 183 lb 3.2 oz 194 lb 0.1 oz  Weight (kg) 83.1 kg 83.1 kg 88 kg     Body mass index is 33.51 kg/m.  General:  Well nourished, well developed, in no acute distress HEENT:  normal Neck: no JVD Vascular: No carotid bruits; Distal pulses 2+ bilaterally Cardiac:  normal S1, S2; RRR; no murmur  Lungs:  clear to auscultation bilaterally, no wheezing, rhonchi or rales  Abd: soft, nontender, no hepatomegaly  Ext: no edema Musculoskeletal:  No deformities, BUE and BLE strength normal and equal Skin: warm and dry  Neuro:  CNs 2-12 intact, no focal abnormalities noted Psych:  Normal affect   EKG:  The EKG was personally reviewed and demonstrates: Possible atrial fibrillation versus junctional rhythm, however given the irregularity, more likely to be atrial fibrillation. Telemetry:  Telemetry was personally reviewed and demonstrates: Possible atrial fibrillation between 6 AM on the 9 AM on 5/24, since then, patient was in sinus rhythm.  She has been off of the telemetry since 6:30 PM on 5/25.  Relevant CV Studies:  Echo 12/12/2021  1. Left ventricular ejection fraction, by estimation, is 65 to 70%. The  left ventricle has normal function. The left ventricle has no regional  wall motion abnormalities. There is mild left ventricular hypertrophy.  Left ventricular diastolic parameters  are consistent with Grade I diastolic dysfunction (impaired relaxation).  Elevated left ventricular end-diastolic pressure. The E/e' is 88.   2. Right ventricular systolic function is normal. The right ventricular  size is normal. There is normal pulmonary artery systolic pressure. The  estimated right ventricular systolic pressure is [id number] mmHg.   3. Left atrial size was mildly dilated.   4. The mitral valve is abnormal. Trivial mitral valve regurgitation.   5. There is sclerosis/calcification of the base of the non-coronary and  right coronary cusps. The aortic valve is tricuspid. Aortic valve  regurgitation is not visualized. Aortic valve sclerosis/calcification is  present, without any evidence of aortic  stenosis.   6. The inferior vena cava is normal in size with greater than 50%   respiratory variability, suggesting right atrial pressure of 3 mmHg.   Comparison(s): No prior Echocardiogram.   Laboratory Data:  High Sensitivity Troponin:   Recent Labs  Lab 12/11/21 0359 12/11/21 0750  TROPONINIHS 30* 38*     Chemistry Recent Labs  Lab 12/11/21 0359 12/11/21 1153 12/11/21 1841 12/13/21 0610  NA 144 147* 141 143  K 2.7* 3.6 3.1* 3.3*  CL 109 115* 115* 115*  CO2 27 26 24 22   GLUCOSE 102* 108* 121* 120*  BUN 14 11 14  <5*  CREATININE 0.84 0.71 0.68 0.59  CALCIUM 8.6* 8.0* 8.0* 8.4*  MG 2.0  --   --   --   GFRNONAA >60 >60 >60 >60  ANIONGAP 8 6 2* 6    Recent Labs  Lab 12/11/21 0359  PROT 7.0  ALBUMIN 3.4*  AST 30  ALT 22  ALKPHOS 58  BILITOT 0.8   Lipids No results for input(s): CHOL, TRIG, HDL, LABVLDL, LDLCALC, CHOLHDL in the last 168 hours.  Hematology Recent Labs  Lab 12/11/21 0359  WBC 5.8  RBC 5.45*  HGB 15.4*  HCT 47.1*  MCV 86.4  MCH 28.3  MCHC 32.7  RDW 14.6  PLT 220   Thyroid  Recent Labs  Lab 12/11/21 0750  TSH 5.399*  FREET4 1.15*    BNPNo results for input(s): BNP, PROBNP in the last 168 hours.  DDimer No results for input(s): DDIMER in the last 168 hours.   Radiology/Studies:  DG Chest Port 1 View  Result Date: 12/11/2021 CLINICAL DATA:  Abnormal EKG.  Psychiatric patient EXAM: PORTABLE CHEST 1 VIEW COMPARISON:  10/29/2021 FINDINGS: Borderline heart size with diffuse interstitial coarsening which has been seen on priors. No definite Kerley lines, the left lung base is partially seen. No visible effusion or pneumothorax. IMPRESSION: 1. No acute finding when compared to priors. 2. Chronic lung disease and borderline heart size. Electronically Signed   By: Jorje Guild M.D.   On: 12/11/2021 05:16   ECHOCARDIOGRAM COMPLETE  Result Date: 12/12/2021    ECHOCARDIOGRAM REPORT   Patient Name:   RASHEEDAH LIMBACK Date of Exam: 12/12/2021 Medical Rec #:  [id number]          Height:       62.0 in Accession #:     [id number]         Weight:       183.2 lb Date of Birth:  1940-12-21           BSA:          1.842 m Patient Age:    43 years           BP:           138/61 mmHg Patient Gender: F                  HR:           59 bpm. Exam Location:  Inpatient Procedure: 2D Echo, Cardiac Doppler and Color Doppler Indications:    A-Fib  History:        Patient has no prior history of Echocardiogram examinations.                 COPD. Hypothyroidism ; Dementia.  Sonographer:    Joette Catching RCS Referring Phys: Earnie Larsson LAMA  Sonographer Comments: Technically challenging study due to limited acoustic windows. Image acquisition challenging due to uncooperative patient. IMPRESSIONS  1. Left ventricular ejection fraction, by estimation, is 65 to 70%. The left ventricle has normal function. The left ventricle has no regional wall motion abnormalities. There is mild left ventricular hypertrophy. Left ventricular diastolic parameters are consistent with Grade I diastolic dysfunction (impaired relaxation).  Elevated left ventricular end-diastolic pressure. The E/e' is 8.  2. Right ventricular systolic function is normal. The right ventricular size is normal. There is normal pulmonary artery systolic pressure. The estimated right ventricular systolic pressure is [id number] mmHg.  3. Left atrial size was mildly dilated.  4. The mitral valve is abnormal. Trivial mitral valve regurgitation.  5. There is sclerosis/calcification of the base of the non-coronary and right coronary cusps. The aortic valve is tricuspid. Aortic valve regurgitation is not visualized. Aortic valve sclerosis/calcification is present, without any evidence of aortic stenosis.  6. The inferior vena cava is normal in size with greater than 50% respiratory variability, suggesting right atrial pressure of 3 mmHg. Comparison(s): No prior Echocardiogram. FINDINGS  Left Ventricle: Left ventricular ejection fraction, by estimation, is 65 to 70%. The left ventricle has normal  function. The left ventricle has no regional wall motion abnormalities. The left ventricular internal cavity size was normal in size. There is  mild left ventricular hypertrophy. Left ventricular diastolic parameters are consistent with Grade I diastolic dysfunction (impaired relaxation). Elevated left ventricular end-diastolic pressure. The E/e' is 20. Right Ventricle: The right ventricular size is normal. No increase in right ventricular wall thickness. Right ventricular systolic function is normal. There is normal pulmonary artery systolic pressure. The tricuspid regurgitant velocity is 2.05 m/s, and  with an assumed right atrial pressure of 3 mmHg, the estimated right ventricular systolic pressure is [id number] mmHg. Left Atrium: Left atrial size was mildly dilated. Right Atrium: Right atrial size was normal in size. Pericardium: There is no evidence of pericardial effusion. Mitral Valve: The mitral valve is abnormal. Mild mitral annular calcification. Trivial mitral valve regurgitation. Tricuspid Valve: The tricuspid valve is grossly normal. Tricuspid valve regurgitation is trivial. Aortic Valve: There is sclerosis/calcification of the base of the non-coronary and right coronary cusps. The aortic valve is tricuspid. Aortic valve regurgitation is not visualized. Aortic valve sclerosis/calcification is present, without any evidence of  aortic stenosis. Aortic valve mean gradient measures 5.0 mmHg. Aortic valve peak gradient measures 8.8 mmHg. Aortic valve area, by VTI measures 2.03 cm. Pulmonic Valve: The pulmonic valve was normal in structure. Pulmonic valve regurgitation is not visualized. Aorta: The aortic root and ascending aorta are structurally normal, with no evidence of dilitation. Venous: The inferior vena cava is normal in size with greater than 50% respiratory variability, suggesting right atrial pressure of 3 mmHg. IAS/Shunts: No atrial level shunt detected by color flow Doppler.  LEFT VENTRICLE PLAX 2D  LVIDd:         4.60 cm     Diastology LVIDs:         2.70 cm     LV e' medial:    2.61 cm/s LV PW:         1.30 cm     LV E/e' medial:  24.8 LV IVS:        1.10 cm     LV e' lateral:   4.03 cm/s LVOT diam:     2.00 cm     LV E/e' lateral: 16.1 LV SV:         66 LV SV Index:   36 LVOT Area:     3.14 cm  LV Volumes (MOD) LV vol d, MOD A2C: 47.9 ml LV vol d, MOD A4C: 63.1 ml LV vol s, MOD A2C: 22.8 ml LV vol s, MOD A4C: 30.5 ml LV SV MOD A2C:     25.1 ml LV SV MOD A4C:  63.1 ml LV SV MOD BP:      29.2 ml RIGHT VENTRICLE             IVC RV S prime:     18.00 cm/s  IVC diam: 0.80 cm LEFT ATRIUM             Index LA diam:        3.10 cm 1.68 cm/m LA Vol (A2C):   51.2 ml 27.80 ml/m LA Vol (A4C):   72.2 ml 39.20 ml/m LA Biplane Vol: 61.8 ml 33.56 ml/m  AORTIC VALVE                    PULMONIC VALVE AV Area (Vmax):    2.27 cm     PV Vmax:       0.84 m/s AV Area (Vmean):   2.19 cm     PV Peak grad:  2.8 mmHg AV Area (VTI):     2.03 cm AV Vmax:           148.00 cm/s AV Vmean:          96.700 cm/s AV VTI:            0.327 m AV Peak Grad:      8.8 mmHg AV Mean Grad:      5.0 mmHg LVOT Vmax:         107.00 cm/s LVOT Vmean:        67.500 cm/s LVOT VTI:          0.211 m LVOT/AV VTI ratio: 0.65  AORTA Ao Root diam: 3.40 cm Ao Asc diam:  3.60 cm MITRAL VALVE                TRICUSPID VALVE MV Area (PHT): 2.68 cm     TR Peak grad:   16.8 mmHg MV Decel Time: 283 msec     TR Vmax:        205.00 cm/s MV E velocity: 64.70 cm/s MV A velocity: 101.00 cm/s  SHUNTS MV E/A ratio:  0.64         Systemic VTI:  0.21 m                             Systemic Diam: 2.00 cm Zoila Shutter MD Electronically signed by Zoila Shutter MD Signature Date/Time: 12/12/2021/1:58:38 PM    Final      Assessment and Plan:   Possible atrial fibrillation:  -No cardiac awareness.  Looking at telemetry, she was admitted in early morning of 5/24, however she was only in this irregular slow rhythm with no P wave from 6 AM until 9 AM.  Since then, she has  been in normal sinus rhythm.  She is no longer on telemetry since 6:30 PM on 5/25.  We will repeat EKG today, likely will show sinus rhythm.  -EKG showed bradycardia with no discernible P waves, possible atrial fibrillation versus junctional rhythm, given irregularity, this makes atrial fibrillation more likely.  She was bradycardic in the 40s on arrival, she clearly has conduction abnormality.  Would avoid any AV nodal blocking agent in this patient.  -Unfortunately, even if she does have atrial fibrillation, given the transient nature, dementia, questionable compliance with medication and suicidal ideation (she denied suicidal ideation to psychiatry team, however explained to me that she has several suicidal ideation before however never acted on it, same thing happened in April admission), I do not think  she is a candidate for any anticoagulation therapy.  She has no clear symptoms associated with occasional bradycardia including dizziness, blurred vision or feeling of passing out, unless she clearly has symptomatic bradycardia, I would not commit her to a heart monitor at this time  Suicidal ideation: Patient was initially admitted with involuntary commitment.  IVC has since been removed by psychiatry who felt the patient was not a danger to herself or others.  -Per psychiatry note, patient denied holding a knife to her neck or try to harm herself. -Talking with the patient, she says she had suicidal ideation before multiple times.  She attributed distress with her daughter [name] is a major contributing factor. -Patient was apparently belligerent to the nurse yesterday after visit from family member.  COPD: No active wheezing  Dementia  Hypothyroidism: TSH and free T4 both mildly elevated, questionable compliance at home with her levothyroxine.  Mildly elevated troponin: In the setting of mild bradycardia.  Serial troponin flat and not be a ACS pattern.  She would not be a good candidate for any  invasive work-up.  Given normal echocardiogram, no further work-up at this time.  Tobacco abuse: She says she quit smoking 1 week ago   Risk Assessment/Risk Scores:          CHA2DS2-VASc Score = 4   This indicates a 4.8% annual risk of stroke. The patient's score is based upon: CHF History: 0 HTN History: 1 Diabetes History: 0 Stroke History: 0 Vascular Disease History: 0 Age Score: 2 Gender Score: 1         For questions or updates, please contact [place] Please consult [email or web address] for contact info under    Signed, Almyra Deforest, [place]  12/13/2021 8:41 AM  Patient examined chart reviewed Patient somewhat hostile and uncooperative Poor dentition SEM  Telemetry review shows NSR with PACl  Brief period of PAF and bradycardia in setting of electrolyte abnormalities Agree with PA she is not a candidate for DOAC/Anticoagulation given risks involved with depression and suicidal ideation EF is normal and TTE is low risk with no significant valve dx She is ok to go to [facility] or inpatient psych if needed No further cardiac evaluation planned  Jenkins Rouge MD [facility]

## 2021-12-13 NOTE — Evaluation (Signed)
Physical Therapy Evaluation Patient Details Name: Jackie Garcia MRN: [id number] DOB: Apr 29, 1941 Today's Date: 12/13/2021  History of Present Illness  81 year old female with medical history of COPD, depression, anxiety, dementia, hypothyroidism was sent to the ED on 12/10/21 under IVC after she threatened to kill herself with a knife. Dx of hypokalemia.  Clinical Impression  Pt admitted with above diagnosis. Pt had mild buckling of BLEs with sit to stand. She ambulated 67' with RW without loss of balance, distance limited by fatigue. Min/guard assist for mobility recommended due to fall risk. Pt currently with functional limitations due to the deficits listed below (see PT Problem List). Pt will benefit from skilled PT to increase their independence and safety with mobility to allow discharge to the venue listed below.          Recommendations for follow up therapy are one component of a multi-disciplinary discharge planning process, led by the attending physician.  Recommendations may be updated based on patient status, additional functional criteria and insurance authorization.  Follow Up Recommendations Skilled nursing-short term rehab (<3 hours/day) (per RN and SW, family no longer able to care for pt)    Assistance Recommended at Discharge    Patient can return home with the following  A little help with bathing/dressing/bathroom;Help with stairs or ramp for entrance;Assistance with cooking/housework;Assist for transportation;A little help with walking and/or transfers    Equipment Recommendations Rolling walker (2 wheels)  Recommendations for Other Services       Functional Status Assessment Patient has had a recent decline in their functional status and demonstrates the ability to make significant improvements in function in a reasonable and predictable amount of time.     Precautions / Restrictions Precautions Precautions: Fall Restrictions Weight Bearing Restrictions: No       Mobility  Bed Mobility Overal bed mobility: Modified Independent             General bed mobility comments: used bedrail    Transfers Overall transfer level: Needs assistance Equipment used: Rolling walker (2 wheels) Transfers: Sit to/from Stand Sit to Stand: Supervision           General transfer comment: mild buckling of knees upon standing    Ambulation/Gait Ambulation/Gait assistance: Min guard Gait Distance (Feet): 90 Feet Assistive device: Rolling walker (2 wheels) Gait Pattern/deviations: Step-through pattern, Decreased stride length Gait velocity: decreased     General Gait Details: no buckling while walking, though pt repeatedly stated "I can't walk far"  Stairs            Wheelchair Mobility    Modified Rankin (Stroke Patients Only)       Balance Overall balance assessment: Needs assistance Sitting-balance support: Feet supported Sitting balance-Leahy Scale: Good       Standing balance-Leahy Scale: Poor Standing balance comment: relies on BUE support in standing                             Pertinent Vitals/Pain Pain Assessment Pain Assessment: No/denies pain    Home Living Family/patient expects to be discharged to:: Unsure Living Arrangements: Children Available Help at Discharge: Family;Available 24 hours/day Type of Home: Apartment Home Access: Level entry       Home Layout: One level Home Equipment: Cane - single point;BSC/3in1      Prior Function Prior Level of Function : Independent/Modified Independent;Needs assist             Mobility Comments: patient is  limited household ambulator at baseline with [facility] per her report. ADLs Comments: Requires assist for bathing if getting into/out of tub, otherwise does sink baths.     Hand Dominance        Extremity/Trunk Assessment   Upper Extremity Assessment Upper Extremity Assessment: Defer to OT evaluation    Lower Extremity Assessment Lower  Extremity Assessment: Generalized weakness    Cervical / Trunk Assessment Cervical / Trunk Assessment: Normal  Communication   Communication: HOH  Cognition Arousal/Alertness: Awake/alert Behavior During Therapy: Agitated Overall Cognitive Status: History of cognitive impairments - at baseline                                 General Comments: Pt was resistive to OOB activity however with max encouragement, pt gets agitated and agrees to walk.        General Comments      Exercises     Assessment/Plan    PT Assessment Patient needs continued PT services  PT Problem List Decreased strength;Decreased mobility;Decreased activity tolerance;Cardiopulmonary status limiting activity;Decreased knowledge of use of DME       PT Treatment Interventions DME instruction;Therapeutic exercise;Gait training;Functional mobility training;Therapeutic activities;Patient/family education    PT Goals (Current goals can be found in the Care Plan section)  Acute Rehab PT Goals Patient Stated Goal: none stated PT Goal Formulation: With patient Time For Goal Achievement: 12/27/21    Frequency Min 2X/week     Co-evaluation               AM-PAC PT "6 Clicks" Mobility  Outcome Measure Help needed turning from your back to your side while in a flat bed without using bedrails?: None Help needed moving from lying on your back to sitting on the side of a flat bed without using bedrails?: None Help needed moving to and from a bed to a chair (including a wheelchair)?: A Little Help needed standing up from a chair using your arms (e.g., wheelchair or bedside chair)?: A Little Help needed to walk in hospital room?: A Little Help needed climbing 3-5 steps with a railing? : A Little 6 Click Score: 20    End of Session Equipment Utilized During Treatment: Gait belt Activity Tolerance: Patient limited by fatigue Patient left: in bed;with bed alarm set;with call bell/phone within  reach Nurse Communication: Mobility status PT Visit Diagnosis: Muscle weakness (generalized) (M62.81)    Time: [id number] PT Time Calculation (min) (ACUTE ONLY): 13 min   Charges:   PT Evaluation $PT Eval Moderate Complexity: 1 Mod         Philomena Doheny PT 12/13/2021  Acute Rehabilitation Services Pager [phone] Office [phone]

## 2021-12-13 NOTE — Progress Notes (Signed)
[facility]  PROGRESS NOTE  Jackie Garcia [id number] DOB: July 01, 1941 DOA: 12/10/2021 PCP: Pcp, No   Brief HPI:    81 year old female with medical history of COPD, depression, anxiety, dementia, hypothyroidism was sent to the ED under IVC after she threatened to kill herself with a knife.  As per family patient has not been eating and drinking and refuse medications for several days.  She told family that she will kill herself. Patient was brought to the ED and found to have hypokalemia. Psychiatry was consulted, patient cleared by psychiatry.  IVC rescinded.   Subjective   Patient seen and examined, has been agitated since her family visited yesterday.  She was started on Depakote.  Refused p.o. potassium yesterday.   Assessment/Plan:   Hypokalemia -Potassium still 3.3 -We will give additional dose of K. Dur 40 mEq p.o. x1 -Follow BMP in am   Suicidal ideation -IVC  -Psych was consulted and IVC rescinded -Continue one-to-one observation -Patient is not suicidal as per psych   Atrial fibrillation -EKG showed atrial fibrillation with heart rate 47 -TSH 5.399 -Echocardiogram shows EF 65 to [id number], grade 1 diastolic dysfunction -[id number] score is 5 -Cardiology was consulted, patient had brief episode of atrial fibrillation and is now back to normal sinus rhythm -Not a candidate for anticoagulation due to suicidal ideation and dementia as per cardiology  Dementia with behavior disturbance -Started on Depakote to 50 mg p.o. twice daily -Continue Zoloft  Troponin elevation -Patient had mild troponin elevation -No chest pain or shortness of breath -Echocardiogram shows EF of 65 to 70%    COPD -No coughing or wheezing noted -Continue ICS/LABA, as needed albuterol   Hypertension -Continue Norvasc   Hypothyroidism -Continue Synthroid -TSH 5.399     Patient will need to go to skilled nursing facility for rehab   Medications     amLODipine  10 mg  Oral Daily   divalproex  250 mg Oral Q12H   enoxaparin (LOVENOX) injection  40 mg Subcutaneous Q24H   levothyroxine  50 mcg Oral q AM   mometasone-formoterol  2 puff Inhalation BID   montelukast  10 mg Oral QHS   pantoprazole  40 mg Oral Daily   rosuvastatin  5 mg Oral Daily   sertraline  100 mg Oral q morning   sodium chloride flush  3 mL Intravenous Q12H     Data Reviewed:   CBG:  No results for input(s): GLUCAP in the last 168 hours.  SpO2: 95 %    Vitals:   12/12/21 2013 12/13/21 0610 12/13/21 0726 12/13/21 1426  BP: 139/62 136/64  (!) 121/48  Pulse: 66 60  (!) 59  Resp: 18 18  16   Temp: 97.9 F (36.6 C) 98.1 F (36.7 C)  97.9 F (36.6 C)  TempSrc: Oral Oral  Oral  SpO2: 94% (!) 89% 90% 95%  Weight:      Height:          Data Reviewed:  Basic Metabolic Panel: Recent Labs  Lab 12/11/21 0359 12/11/21 1153 12/11/21 1841 12/13/21 0610  NA 144 147* 141 143  K 2.7* 3.6 3.1* 3.3*  CL 109 115* 115* 115*  CO2 27 26 24 22   GLUCOSE 102* 108* 121* 120*  BUN 14 11 14  <5*  CREATININE 0.84 0.71 0.68 0.59  CALCIUM 8.6* 8.0* 8.0* 8.4*  MG 2.0  --   --   --     CBC: Recent Labs  Lab 12/11/21 0359  WBC 5.8  NEUTROABS 2.7  HGB 15.4*  HCT 47.1*  MCV 86.4  PLT 220    LFT Recent Labs  Lab 12/11/21 0359  AST 30  ALT 22  ALKPHOS 58  BILITOT 0.8  PROT 7.0  ALBUMIN 3.4*     Antibiotics: Anti-infectives (From admission, onward)    Start     Dose/Rate Route Frequency Ordered Stop   12/12/21 1745  cefTRIAXone (ROCEPHIN) 1 g in sodium chloride 0.9 % 100 mL IVPB        1 g 200 mL/hr over 30 Minutes Intravenous Every 24 hours 12/12/21 1657          DVT prophylaxis: Lovenox  Code Status: Full code  Family Communication: No family at bedside   CONSULTS psychiatry   Objective    Physical Examination:   General-appears in no acute distress Heart-S1-S2, regular, no murmur auscultated Lungs-clear to auscultation bilaterally, no wheezing or  crackles auscultated Abdomen-soft, nontender, no organomegaly Extremities-no edema in the lower extremities Neuro-alert, oriented x3, no focal deficit noted neck   Status is: Inpatient: Suicidal ideation         Athelstan   [facility] If 7PM-7AM, please contact night-coverage at [email or web address], Office  [phone]   12/13/2021, 2:46 PM  LOS: 1 day

## 2021-12-13 NOTE — Progress Notes (Signed)
Patient refused take her night time meds including depakote. Hospitalist on call made aware.

## 2021-12-13 NOTE — NC FL2 (Signed)
[place] MEDICAID FL2 LEVEL OF CARE SCREENING TOOL     IDENTIFICATION  Patient Name: Jackie Garcia Birthdate: [birth year] Sex: female Admission Date (Current Location): 12/10/2021  [facility] and [place] Number:  Producer, television/film/video and Address:  [facility],  [address] [place], [place] [address]      Provider Number: [id number]  Attending Physician Name and Address:  Meredeth Ide, MD  Relative Name and Phone Number:  Daughter, Tyron Russell [phone])    Current Level of Care: Hospital Recommended Level of Care: Skilled Nursing Facility Prior Approval Number:    Date Approved/Denied:   PASRR Number: [id number]  Discharge Plan: SNF    Current Diagnoses: Patient Active Problem List   Diagnosis Date Noted   Dehydration 12/12/2021   Hypokalemia 12/11/2021   Elevated troponin 12/11/2021   Essential hypertension 11/07/2021   Dyslipidemia 11/07/2021   Depression 11/07/2021   Hypothyroidism 10/08/2020   COPD (chronic obstructive pulmonary disease) (HCC) 10/07/2020   Tobacco abuse 10/07/2020   Tobacco use disorder 10/07/2020    Orientation RESPIRATION BLADDER Height & Weight     Self, Time, Situation  Normal Continent, External catheter Weight: 83.1 kg Height:  5\' 2"  (157.5 cm)  BEHAVIORAL SYMPTOMS/MOOD NEUROLOGICAL BOWEL NUTRITION STATUS      Incontinent    AMBULATORY STATUS COMMUNICATION OF NEEDS Skin   Limited Assist Verbally Normal                       Personal Care Assistance Level of Assistance  Bathing, Feeding, Dressing, Total care Bathing Assistance: Limited assistance Feeding assistance: Limited assistance Dressing Assistance: Limited assistance Total Care Assistance: Limited assistance   Functional Limitations Info             SPECIAL CARE FACTORS FREQUENCY  PT (By licensed PT), OT (By licensed OT)     PT Frequency: 5x/week OT Frequency: 5x/week            Contractures Contractures Info: Not present     Additional Factors Info  Code Status, Allergies, Psychotropic Code Status Info: Full Allergies Info: Naproxen, Sulfa Antibiotics, Codeine Psychotropic Info: See MAR         Current Medications (12/13/2021):  This is the current hospital active medication list Current Facility-Administered Medications  Medication Dose Route Frequency Provider Last Rate Last Admin   acetaminophen (TYLENOL) tablet 650 mg  650 mg Oral Q6H PRN Opyd, 12/15/2021, MD       Or   acetaminophen (TYLENOL) suppository 650 mg  650 mg Rectal Q6H PRN Opyd, Lavone Neri, MD       albuterol (PROVENTIL) (2.5 MG/3ML) 0.083% nebulizer solution 2.5 mg  2.5 mg Inhalation Q4H PRN Opyd, Lavone Neri, MD       amLODipine (NORVASC) tablet 10 mg  10 mg Oral Daily Opyd, Lavone Neri, MD   10 mg at 12/13/21 1010   cefTRIAXone (ROCEPHIN) 1 g in sodium chloride 0.9 % 100 mL IVPB  1 g Intravenous Q24H 12/15/21, MD   Stopped at 12/12/21 1938   divalproex (DEPAKOTE) DR tablet 250 mg  250 mg Oral Q12H 12/14/21, Gagan S, MD   250 mg at 12/13/21 1010   enoxaparin (LOVENOX) injection 40 mg  40 mg Subcutaneous Q24H Opyd, 12/15/21, MD   40 mg at 12/13/21 1011   levothyroxine (SYNTHROID) tablet 50 mcg  50 mcg Oral q AM Opyd, 12/15/21, MD   50 mcg at 12/13/21 0604   mometasone-formoterol (DULERA) 200-5  MCG/ACT inhaler 2 puff  2 puff Inhalation BID Opyd, Lavone Neri, MD   2 puff at 12/13/21 0725   montelukast (SINGULAIR) tablet 10 mg  10 mg Oral QHS Opyd, Lavone Neri, MD   10 mg at 12/12/21 1956   ondansetron (ZOFRAN) tablet 4 mg  4 mg Oral Q6H PRN Opyd, Lavone Neri, MD       Or   ondansetron (ZOFRAN) injection 4 mg  4 mg Intravenous Q6H PRN Opyd, Lavone Neri, MD       pantoprazole (PROTONIX) EC tablet 40 mg  40 mg Oral Daily Opyd, Lavone Neri, MD   40 mg at 12/13/21 1010   rosuvastatin (CRESTOR) tablet 5 mg  5 mg Oral Daily Opyd, Lavone Neri, MD   5 mg at 12/13/21 1010   sertraline (ZOLOFT) tablet 100 mg  100 mg Oral q morning Opyd, Lavone Neri, MD   100 mg at  12/13/21 1010   sodium chloride flush (NS) 0.9 % injection 3 mL  3 mL Intravenous Q12H Opyd, Lavone Neri, MD   3 mL at 12/12/21 1156   traZODone (DESYREL) tablet 100 mg  100 mg Oral QHS PRN Opyd, Lavone Neri, MD   100 mg at 12/12/21 1956     Discharge Medications: Please see discharge summary for a list of discharge medications.  Relevant Imaging Results:  Relevant Lab Results:   Additional Information SSN [id number]  Otelia Santee, LCSW

## 2021-12-14 DIAGNOSIS — F03918 Unspecified dementia, unspecified severity, with other behavioral disturbance: Secondary | ICD-10-CM | POA: Diagnosis not present

## 2021-12-14 DIAGNOSIS — E876 Hypokalemia: Secondary | ICD-10-CM | POA: Diagnosis not present

## 2021-12-14 DIAGNOSIS — I480 Paroxysmal atrial fibrillation: Secondary | ICD-10-CM | POA: Diagnosis not present

## 2021-12-14 DIAGNOSIS — R778 Other specified abnormalities of plasma proteins: Secondary | ICD-10-CM | POA: Diagnosis not present

## 2021-12-14 DIAGNOSIS — I10 Essential (primary) hypertension: Secondary | ICD-10-CM | POA: Diagnosis not present

## 2021-12-14 LAB — BASIC METABOLIC PANEL
Anion gap: 8 (ref 5–15)
BUN: <5 mg/dL — ABNORMAL LOW (ref 8–23)
CO2: 24 mmol/L (ref 22–32)
Calcium: 8.7 mg/dL — ABNORMAL LOW (ref 8.9–10.3)
Chloride: 109 mmol/L (ref 98–111)
Creatinine, Ser: 0.5 mg/dL (ref 0.44–1.00)
GFR, Estimated: >60 mL/min (ref >60)
Glucose, Bld: 83 mg/dL (ref 70–99)
Potassium: 3.6 mmol/L (ref 3.5–5.1)
Sodium: 141 mmol/L (ref 135–145)

## 2021-12-14 MED ORDER — QUETIAPINE FUMARATE 25 MG PO TABS
25.0000 mg | ORAL_TABLET | Freq: Every day | ORAL | Status: DC
  Administered 2021-12-14: 25 mg via ORAL
  Filled 2021-12-14 (×4): qty 1

## 2021-12-14 NOTE — Progress Notes (Signed)
Subjective:  Denies SSCP, palpitations or Dyspnea My heart is fine   Objective:  Vitals:   12/13/21 1426 12/13/21 1941 12/13/21 2019 12/14/21 0616  BP: (!) 121/48 (!) 127/91  139/65  Pulse: (!) 59 63  61  Resp: 16 (!) 22  15  Temp: 97.9 F (36.6 C) 97.6 F (36.4 C)  98.2 F (36.8 C)  TempSrc: Oral   Oral  SpO2: 95% 95% 91% 93%  Weight:      Height:        Intake/Output from previous day:  Intake/Output Summary (Last 24 hours) at 12/14/2021 0919 Last data filed at 12/14/2021 0400 Gross per 24 hour  Intake 0 ml  Output 1200 ml  Net -1200 ml    Chronically ill female Poor dentition  SEM  Poor inspiratory effort  Abdomen benign Trace edeam Palpable pedal pulses Neuro non focal   Lab Results: Basic Metabolic Panel: Recent Labs    12/13/21 0610 12/14/21 0535  NA 143 141  K 3.3* 3.6  CL 115* 109  CO2 22 24  GLUCOSE 120* 83  BUN <5* <5*  CREATININE 0.59 0.50  CALCIUM 8.4* 8.7*     Imaging: ECHOCARDIOGRAM COMPLETE  Result Date: 12/12/2021    ECHOCARDIOGRAM REPORT   Patient Name:   Jackie Garcia Date of Exam: 12/12/2021 Medical Rec #:  [id number]          Height:       62.0 in Accession #:    [id number]         Weight:       183.2 lb Date of Birth:  1941-07-08           BSA:          1.842 m Patient Age:    80 years           BP:           138/61 mmHg Patient Gender: F                  HR:           59 bpm. Exam Location:  Inpatient Procedure: 2D Echo, Cardiac Doppler and Color Doppler Indications:    A-Fib  History:        Patient has no prior history of Echocardiogram examinations.                 COPD. Hypothyroidism ; Dementia.  Sonographer:    Rodrigo Ran RCS Referring Phys: Gomez Cleverly LAMA  Sonographer Comments: Technically challenging study due to limited acoustic windows. Image acquisition challenging due to uncooperative patient. IMPRESSIONS  1. Left ventricular ejection fraction, by estimation, is 65 to 70%. The left ventricle has normal  function. The left ventricle has no regional wall motion abnormalities. There is mild left ventricular hypertrophy. Left ventricular diastolic parameters are consistent with Grade I diastolic dysfunction (impaired relaxation). Elevated left ventricular end-diastolic pressure. The E/e' is 20.  2. Right ventricular systolic function is normal. The right ventricular size is normal. There is normal pulmonary artery systolic pressure. The estimated right ventricular systolic pressure is 19.8 mmHg.  3. Left atrial size was mildly dilated.  4. The mitral valve is abnormal. Trivial mitral valve regurgitation.  5. There is sclerosis/calcification of the base of the non-coronary and right coronary cusps. The aortic valve is tricuspid. Aortic valve regurgitation is not visualized. Aortic valve sclerosis/calcification is present, without any evidence of aortic stenosis.  6. The inferior vena cava  is normal in size with greater than 50% respiratory variability, suggesting right atrial pressure of 3 mmHg. Comparison(s): No prior Echocardiogram. FINDINGS  Left Ventricle: Left ventricular ejection fraction, by estimation, is 65 to 70%. The left ventricle has normal function. The left ventricle has no regional wall motion abnormalities. The left ventricular internal cavity size was normal in size. There is  mild left ventricular hypertrophy. Left ventricular diastolic parameters are consistent with Grade I diastolic dysfunction (impaired relaxation). Elevated left ventricular end-diastolic pressure. The E/e' is 20. Right Ventricle: The right ventricular size is normal. No increase in right ventricular wall thickness. Right ventricular systolic function is normal. There is normal pulmonary artery systolic pressure. The tricuspid regurgitant velocity is 2.05 m/s, and  with an assumed right atrial pressure of 3 mmHg, the estimated right ventricular systolic pressure is [id number] mmHg. Left Atrium: Left atrial size was mildly dilated. Right  Atrium: Right atrial size was normal in size. Pericardium: There is no evidence of pericardial effusion. Mitral Valve: The mitral valve is abnormal. Mild mitral annular calcification. Trivial mitral valve regurgitation. Tricuspid Valve: The tricuspid valve is grossly normal. Tricuspid valve regurgitation is trivial. Aortic Valve: There is sclerosis/calcification of the base of the non-coronary and right coronary cusps. The aortic valve is tricuspid. Aortic valve regurgitation is not visualized. Aortic valve sclerosis/calcification is present, without any evidence of  aortic stenosis. Aortic valve mean gradient measures 5.0 mmHg. Aortic valve peak gradient measures 8.8 mmHg. Aortic valve area, by VTI measures 2.03 cm. Pulmonic Valve: The pulmonic valve was normal in structure. Pulmonic valve regurgitation is not visualized. Aorta: The aortic root and ascending aorta are structurally normal, with no evidence of dilitation. Venous: The inferior vena cava is normal in size with greater than 50% respiratory variability, suggesting right atrial pressure of 3 mmHg. IAS/Shunts: No atrial level shunt detected by color flow Doppler.  LEFT VENTRICLE PLAX 2D LVIDd:         4.60 cm     Diastology LVIDs:         2.70 cm     LV e' medial:    2.61 cm/s LV PW:         1.30 cm     LV E/e' medial:  24.8 LV IVS:        1.10 cm     LV e' lateral:   4.03 cm/s LVOT diam:     2.00 cm     LV E/e' lateral: 16.1 LV SV:         66 LV SV Index:   36 LVOT Area:     3.14 cm  LV Volumes (MOD) LV vol d, MOD A2C: 47.9 ml LV vol d, MOD A4C: 63.1 ml LV vol s, MOD A2C: 22.8 ml LV vol s, MOD A4C: 30.5 ml LV SV MOD A2C:     25.1 ml LV SV MOD A4C:     63.1 ml LV SV MOD BP:      29.2 ml RIGHT VENTRICLE             IVC RV S prime:     18.00 cm/s  IVC diam: 0.80 cm LEFT ATRIUM             Index LA diam:        3.10 cm 1.68 cm/m LA Vol (A2C):   51.2 ml 27.80 ml/m LA Vol (A4C):   72.2 ml 39.20 ml/m LA Biplane Vol: 61.8 ml 33.56 ml/m  AORTIC VALVE  PULMONIC VALVE AV Area (Vmax):    2.27 cm     PV Vmax:       0.84 m/s AV Area (Vmean):   2.19 cm     PV Peak grad:  2.8 mmHg AV Area (VTI):     2.03 cm AV Vmax:           148.00 cm/s AV Vmean:          96.700 cm/s AV VTI:            0.327 m AV Peak Grad:      8.8 mmHg AV Mean Grad:      5.0 mmHg LVOT Vmax:         107.00 cm/s LVOT Vmean:        67.500 cm/s LVOT VTI:          0.211 m LVOT/AV VTI ratio: 0.65  AORTA Ao Root diam: 3.40 cm Ao Asc diam:  3.60 cm MITRAL VALVE                TRICUSPID VALVE MV Area (PHT): 2.68 cm     TR Peak grad:   16.8 mmHg MV Decel Time: 283 msec     TR Vmax:        205.00 cm/s MV E velocity: 64.70 cm/s MV A velocity: 101.00 cm/s  SHUNTS MV E/A ratio:  0.64         Systemic VTI:  0.21 m                             Systemic Diam: 2.00 cm Lyman Bishop MD Electronically signed by Lyman Bishop MD Signature Date/Time: 12/12/2021/1:58:38 PM    Final     Cardiac Studies:  ECG: SB rate 45 LVH lateral T wave changes    Telemetry:  NSR   Echo: EF 65-70% AV sclerosis trivial MR  Medications:    amLODipine  10 mg Oral Daily   divalproex  250 mg Oral Q12H   enoxaparin (LOVENOX) injection  40 mg Subcutaneous Q24H   levothyroxine  50 mcg Oral q AM   mometasone-formoterol  2 puff Inhalation BID   montelukast  10 mg Oral QHS   pantoprazole  40 mg Oral Daily   rosuvastatin  5 mg Oral Daily   sertraline  100 mg Oral q morning   sodium chloride flush  3 mL Intravenous Q12H      cefTRIAXone (ROCEPHIN)  IV 1 g (12/13/21 1806)    Assessment/Plan:   PAF:  NSR now rates low no need for beta blocker not a candidate for anticoagulation with MS changes non compliance and suicidal ideation  HTN:  continue amlodipine it has little AV nodal properties HLD:  on statin  Seizures/Bipolar refusing Valproate meds this admission  Thyroid:  on replacement  TSH 5.4   Cardiology will sign off  No need for f/u with [place]   Jenkins Rouge 12/14/2021, 9:19 AM

## 2021-12-14 NOTE — Progress Notes (Signed)
[facility]  PROGRESS NOTE  SOCORRO Jackie Garcia [id number] DOB: 08/05/1940 DOA: 12/10/2021 PCP: Pcp, No   Brief HPI:    81 year old female with medical history of COPD, depression, anxiety, dementia, hypothyroidism was sent to the ED under IVC after she threatened to kill herself with a knife.  As per family patient has not been eating and drinking and refuse medications for several days.  She told family that she will kill herself. Patient was brought to the ED and found to have hypokalemia. Psychiatry was consulted, patient cleared by psychiatry.  IVC rescinded.   Subjective   Patient seen and examined, no new complaints.  Still intermittently refusing medications as per nursing staff.   Assessment/Plan:   Hypokalemia -Replete   Suicidal ideation -IVC  -Psych was consulted and IVC rescinded -Continue one-to-one observation -Patient is not suicidal as per psych   Atrial fibrillation -EKG showed atrial fibrillation with heart rate 47 -TSH 5.399 -Echocardiogram shows EF 65 to [id number], grade 1 diastolic dysfunction -99991111 score is 5 -Cardiology was consulted, patient had brief episode of atrial fibrillation and is now back to normal sinus rhythm -Not a candidate for anticoagulation due to suicidal ideation and dementia as per cardiology  Dementia with behavior disturbance -Started on Depakote 250 mg p.o. twice daily -Continue Zoloft -We will start Seroquel 25 mg nightly  Troponin elevation -Patient had mild troponin elevation -No chest pain or shortness of breath -Echocardiogram shows EF of 65 to 70%    COPD -No coughing or wheezing noted -Continue ICS/LABA, as needed albuterol   Hypertension -Continue Norvasc   Hypothyroidism -Continue Synthroid -TSH 5.399   ?  UTI -Patient had abnormal UA, urine culture obtained showed no growth -She was empirically started on IV Rocephin -We will discontinue Rocephin at this time  Patient will need to go to  skilled nursing facility for rehab   Medications     amLODipine  10 mg Oral Daily   divalproex  250 mg Oral Q12H   enoxaparin (LOVENOX) injection  40 mg Subcutaneous Q24H   levothyroxine  50 mcg Oral q AM   mometasone-formoterol  2 puff Inhalation BID   montelukast  10 mg Oral QHS   pantoprazole  40 mg Oral Daily   QUEtiapine  25 mg Oral QHS   rosuvastatin  5 mg Oral Daily   sertraline  100 mg Oral q morning   sodium chloride flush  3 mL Intravenous Q12H     Data Reviewed:   CBG:  No results for input(s): GLUCAP in the last 168 hours.  SpO2: 93 %    Vitals:   12/13/21 1426 12/13/21 1941 12/13/21 2019 12/14/21 0616  BP: (!) 121/48 (!) 127/91  139/65  Pulse: (!) 59 63  61  Resp: 16 (!) 22  15  Temp: 97.9 F (36.6 C) 97.6 F (36.4 C)  98.2 F (36.8 C)  TempSrc: Oral   Oral  SpO2: 95% 95% 91% 93%  Weight:      Height:          Data Reviewed:  Basic Metabolic Panel: Recent Labs  Lab 12/11/21 0359 12/11/21 1153 12/11/21 1841 12/13/21 0610 12/14/21 0535  NA 144 147* 141 143 141  K 2.7* 3.6 3.1* 3.3* 3.6  CL 109 115* 115* 115* 109  CO2 27 26 24 22 24   GLUCOSE 102* 108* 121* 120* 83  BUN 14 11 14  <5* <5*  CREATININE 0.84 0.71 0.68 0.59 0.50  CALCIUM 8.6* 8.0* 8.0* 8.4* 8.7*  MG 2.0  --   --   --   --     CBC: Recent Labs  Lab 12/11/21 0359  WBC 5.8  NEUTROABS 2.7  HGB 15.4*  HCT 47.1*  MCV 86.4  PLT 220    LFT Recent Labs  Lab 12/11/21 0359  AST 30  ALT 22  ALKPHOS 58  BILITOT 0.8  PROT 7.0  ALBUMIN 3.4*     Antibiotics: Anti-infectives (From admission, onward)    Start     Dose/Rate Route Frequency Ordered Stop   12/12/21 1745  cefTRIAXone (ROCEPHIN) 1 g in sodium chloride 0.9 % 100 mL IVPB        1 g 200 mL/hr over 30 Minutes Intravenous Every 24 hours 12/12/21 1657          DVT prophylaxis: Lovenox  Code Status: Full code  Family Communication: No family at bedside   CONSULTS psychiatry   Objective     Physical Examination:  General-appears in no acute distress Heart-S1-S2, regular, no murmur auscultated Lungs-clear to auscultation bilaterally, no wheezing or crackles auscultated Abdomen-soft, nontender, no organomegaly Extremities-no edema in the lower extremities Neuro-alert, oriented x 2, no focal deficit noted  Status is: Inpatient: Suicidal ideation         [place]   [facility] If 7PM-7AM, please contact night-coverage at [email or web address], Office  [phone]   12/14/2021, 1:47 PM  LOS: 2 days

## 2021-12-14 NOTE — Progress Notes (Signed)
Physical Therapy Treatment Patient Details Name: Jackie Garcia MRN: [id number] DOB: [birth year] Today's Date: 12/14/2021   History of Present Illness 81 year old female with medical history of COPD, depression, anxiety, dementia, hypothyroidism was sent to the ED on 12/10/21 under IVC after she threatened to kill herself with a knife. Dx of hypokalemia.    PT Comments    Pt mildly agitated but cooperates with encouragement. Will benefit from SNF post acute to maximize mobility, safety.    Recommendations for follow up therapy are one component of a multi-disciplinary discharge planning process, led by the attending physician.  Recommendations may be updated based on patient status, additional functional criteria and insurance authorization.  Follow Up Recommendations  Skilled nursing-short term rehab (<3 hours/day)     Assistance Recommended at Discharge Intermittent Supervision/Assistance  Patient can return home with the following A little help with bathing/dressing/bathroom;Help with stairs or ramp for entrance;Assistance with cooking/housework;Assist for transportation;A little help with walking and/or transfers   Equipment Recommendations  Rolling walker (2 wheels)    Recommendations for Other Services       Precautions / Restrictions Precautions Precautions: Fall Restrictions Weight Bearing Restrictions: No     Mobility  Bed Mobility Overal bed mobility: Needs Assistance Bed Mobility: Supine to Sit, Sit to Supine     Supine to sit: Supervision Sit to supine: Supervision   General bed mobility comments: for safety, no physicl assist    Transfers Overall transfer level: Needs assistance Equipment used: Rolling walker (2 wheels) Transfers: Sit to/from Stand Sit to Stand: Supervision, Min guard           General transfer comment: cues for safety, hand placement, control of descent    Ambulation/Gait Ambulation/Gait assistance: Min guard Gait Distance  (Feet): 25 Feet Assistive device: Rolling walker (2 wheels) Gait Pattern/deviations: Step-through pattern, Decreased stride length Gait velocity: decreased     General Gait Details: pt repeatedly states " I can't go far!". no LOB, min/guard for safety   Stairs             Wheelchair Mobility    Modified Rankin (Stroke Patients Only)       Balance   Sitting-balance support: Feet supported Sitting balance-Leahy Scale: Good       Standing balance-Leahy Scale: Poor Standing balance comment: relies on BUE support in standing                            Cognition Arousal/Alertness: Awake/alert Behavior During Therapy: Agitated Overall Cognitive Status: History of cognitive impairments - at baseline                                 General Comments: Pt was resistive to OOB activity however with max encouragement, pt gets agitated and agrees to walk.        Exercises      General Comments        Pertinent Vitals/Pain Pain Assessment Pain Assessment: No/denies pain    Home Living                          Prior Function            PT Goals (current goals can now be found in the care plan section) Acute Rehab PT Goals Patient Stated Goal: none stated PT Goal Formulation: With patient Time For Goal Achievement: 12/27/21  Potential to Achieve Goals: Fair Progress towards PT goals: Progressing toward goals    Frequency    Min 2X/week      PT Plan Current plan remains appropriate    Co-evaluation              AM-PAC PT "6 Clicks" Mobility   Outcome Measure  Help needed turning from your back to your side while in a flat bed without using bedrails?: None Help needed moving from lying on your back to sitting on the side of a flat bed without using bedrails?: None Help needed moving to and from a bed to a chair (including a wheelchair)?: A Little Help needed standing up from a chair using your arms (e.g.,  wheelchair or bedside chair)?: A Little Help needed to walk in hospital room?: A Little Help needed climbing 3-5 steps with a railing? : A Little 6 Click Score: 20    End of Session Equipment Utilized During Treatment: Gait belt Activity Tolerance: Patient limited by fatigue Patient left: with call bell/phone within reach;in bed;with bed alarm set (bedrails x4 on pt request) Nurse Communication: Mobility status PT Visit Diagnosis: Muscle weakness (generalized) (M62.81)     Time: 1540-1550 PT Time Calculation (min) (ACUTE ONLY): 10 min  Charges:  $Gait Training: 8-22 mins                     Baxter Flattery, PT  Acute Rehab Dept ([place]) [phone] Pager [phone]  12/14/2021    [facility] 12/14/2021, 4:36 PM

## 2021-12-15 DIAGNOSIS — E876 Hypokalemia: Secondary | ICD-10-CM | POA: Diagnosis not present

## 2021-12-15 DIAGNOSIS — F03918 Unspecified dementia, unspecified severity, with other behavioral disturbance: Secondary | ICD-10-CM | POA: Diagnosis not present

## 2021-12-15 NOTE — Progress Notes (Signed)
[facility]  PROGRESS NOTE  Jackie Garcia [id number] DOB: [birth year] DOA: 12/10/2021 PCP: Pcp, No   Brief HPI:    81 year old female with medical history of COPD, depression, anxiety, dementia, hypothyroidism was sent to the ED under IVC after she threatened to kill herself with a knife.  As per family patient has not been eating and drinking and refuse medications for several days.  She told family that she will kill herself. Patient was brought to the ED and found to have hypokalemia. Psychiatry was consulted, patient cleared by psychiatry.  IVC rescinded.   Subjective   Patient sleeping this morning.  Was given Seroquel along with Depakote last night.  Easily arousable, denies any complaints.   Assessment/Plan:   Hypokalemia -Replete   Suicidal ideation -IVC  -Psych was consulted and IVC rescinded -Continue one-to-one observation -Patient is not suicidal as per psych   Atrial fibrillation -EKG showed atrial fibrillation with heart rate 47 -TSH 5.399 -Echocardiogram shows EF 65 to [id number], grade 1 diastolic dysfunction -99991111 score is 5 -Cardiology was consulted, patient had brief episode of atrial fibrillation and is now back to normal sinus rhythm -Not a candidate for anticoagulation due to suicidal ideation and dementia as per cardiology  Dementia with behavior disturbance -Started on Depakote 250 mg p.o. twice daily -Continue Zoloft -Started on Seroquel 25 mg nightly  Troponin elevation -Patient had mild troponin elevation -No chest pain or shortness of breath -Echocardiogram shows EF of 65 to 70%    COPD -No coughing or wheezing noted -Continue ICS/LABA, as needed albuterol   Hypertension -Continue Norvasc   Hypothyroidism -Continue Synthroid -TSH 5.399   ?  UTI -Patient had abnormal UA, urine culture obtained showed no growth -She was empirically started on IV Rocephin -We will discontinue Rocephin at this time  Patient will  need to go to skilled nursing facility for rehab   Medications     amLODipine  10 mg Oral Daily   divalproex  250 mg Oral Q12H   enoxaparin (LOVENOX) injection  40 mg Subcutaneous Q24H   levothyroxine  50 mcg Oral q AM   mometasone-formoterol  2 puff Inhalation BID   montelukast  10 mg Oral QHS   pantoprazole  40 mg Oral Daily   QUEtiapine  25 mg Oral QHS   rosuvastatin  5 mg Oral Daily   sertraline  100 mg Oral q morning   sodium chloride flush  3 mL Intravenous Q12H     Data Reviewed:   CBG:  No results for input(s): GLUCAP in the last 168 hours.  SpO2: 94 %    Vitals:   12/14/21 2004 12/14/21 2012 12/15/21 0436 12/15/21 0837  BP:  114/67 120/73 110/62  Pulse:  71 (!) 54   Resp:  16 20   Temp:  98.7 F (37.1 C) 98.6 F (37 C)   TempSrc:  Oral Oral   SpO2: 95% 97% 94%   Weight:      Height:          Data Reviewed:  Basic Metabolic Panel: Recent Labs  Lab 12/11/21 0359 12/11/21 1153 12/11/21 1841 12/13/21 0610 12/14/21 0535  NA 144 147* 141 143 141  K 2.7* 3.6 3.1* 3.3* 3.6  CL 109 115* 115* 115* 109  CO2 27 26 24 22 24   GLUCOSE 102* 108* 121* 120* 83  BUN 14 11 14  <5* <5*  CREATININE 0.84 0.71 0.68 0.59 0.50  CALCIUM 8.6* 8.0* 8.0* 8.4* 8.7*  MG 2.0  --   --   --   --  CBC: Recent Labs  Lab 12/11/21 0359  WBC 5.8  NEUTROABS 2.7  HGB 15.4*  HCT 47.1*  MCV 86.4  PLT 220    LFT Recent Labs  Lab 12/11/21 0359  AST 30  ALT 22  ALKPHOS 58  BILITOT 0.8  PROT 7.0  ALBUMIN 3.4*     Antibiotics: Anti-infectives (From admission, onward)    Start     Dose/Rate Route Frequency Ordered Stop   12/12/21 1745  cefTRIAXone (ROCEPHIN) 1 g in sodium chloride 0.9 % 100 mL IVPB  Status:  Discontinued        1 g 200 mL/hr over 30 Minutes Intravenous Every 24 hours 12/12/21 1657 12/14/21 1348        DVT prophylaxis: Lovenox  Code Status: Full code  Family Communication: No family at bedside   CONSULTS psychiatry   Objective     Physical Examination:  General-appears in no acute distress Heart-S1-S2, regular, no murmur auscultated Lungs-clear to auscultation bilaterally, no wheezing or crackles auscultated Abdomen-soft, nontender, no organomegaly Extremities-no edema in the lower extremities Neuro-alert, oriented x 2, no focal deficit noted  Status is: Inpatient: Suicidal ideation         [place]   [facility] If 7PM-7AM, please contact night-coverage at [email or web address], Office  [phone]   12/15/2021, 10:42 AM  LOS: 3 days

## 2021-12-16 DIAGNOSIS — E876 Hypokalemia: Secondary | ICD-10-CM | POA: Diagnosis not present

## 2021-12-16 DIAGNOSIS — E039 Hypothyroidism, unspecified: Secondary | ICD-10-CM | POA: Diagnosis not present

## 2021-12-16 DIAGNOSIS — F03918 Unspecified dementia, unspecified severity, with other behavioral disturbance: Secondary | ICD-10-CM | POA: Diagnosis not present

## 2021-12-16 LAB — CBC
HCT: 43.2 % (ref 36.0–46.0)
Hemoglobin: 14.8 g/dL (ref 12.0–15.0)
MCH: 29.2 pg (ref 26.0–34.0)
MCHC: 34.3 g/dL (ref 30.0–36.0)
MCV: 85.4 fL (ref 80.0–100.0)
Platelets: 218 10*3/uL (ref 150–400)
RBC: 5.06 MIL/uL (ref 3.87–5.11)
RDW: 14.6 % (ref 11.5–15.5)
WBC: 10 10*3/uL (ref 4.0–10.5)
nRBC: 0 % (ref 0.0–0.2)

## 2021-12-16 MED ORDER — SODIUM CHLORIDE 0.9 % IV SOLN: INTRAVENOUS | Status: DC

## 2021-12-16 NOTE — Progress Notes (Signed)
Care resumed for this patient around 10 AM, agree with previous assessment. Patient does not answer orientation questions appropriately, unable to fully assess mental status. Continues to refuse medications and meals. Will continue to monitor and provide care as able.

## 2021-12-16 NOTE — TOC Progression Note (Signed)
Transition of Care [facility]) - Progression Note    Patient Details  Name: SERRA YOUNAN MRN: [id number] Date of Birth: Nov 19, 1940  Transition of Care [facility]) CM/SW Contact  [name], [place] Phone Number: 12/16/2021, 2:02 PM  Clinical Narrative:     Patient still does not have any beds available for SNF placement.  Updated clinicals resent out to SNFs.  Waiting for bed offers.  Expected Discharge Plan:  (Undetermined) Barriers to Discharge: Family Issues, Unsafe home situation  Expected Discharge Plan and Services Expected Discharge Plan:  (Undetermined)                                               Social Determinants of Health (SDOH) Interventions    Readmission Risk Interventions     View : No data to display.

## 2021-12-16 NOTE — Progress Notes (Addendum)
[facility]  PROGRESS NOTE  Jackie Garcia [id number] DOB: 1941-01-18 DOA: 12/10/2021 PCP: Pcp, No   Brief HPI:    81 year old female with medical history of COPD, depression, anxiety, dementia, hypothyroidism was sent to the ED under IVC after she threatened to kill herself with a knife.  As per family patient has not been eating and drinking and refuse medications for several days.  She told family that she will kill herself. Patient was brought to the ED and found to have hypokalemia. Psychiatry was consulted, patient cleared by psychiatry.  IVC rescinded.   Subjective   Patient seen and examined, refused all medications this morning.  Says that she wants to get out of this place.   Assessment/Plan:   Hypokalemia -Replete   Suicidal ideation -IVC  -Psych was consulted and IVC rescinded -Continue one-to-one observation -Patient is not suicidal as per psych   Atrial fibrillation -EKG showed atrial fibrillation with heart rate 47 -TSH 5.399 -Echocardiogram shows EF 65 to [id number], grade 1 diastolic dysfunction -99991111 score is 5 -Cardiology was consulted, patient had brief episode of atrial fibrillation and is now back to normal sinus rhythm -Not a candidate for anticoagulation due to suicidal ideation and dementia as per cardiology  Dementia with behavior disturbance -Started on Depakote 250 mg p.o. twice daily -Continue Zoloft -Started on Seroquel 25 mg nightly -Patient has been refusing her medications  Troponin elevation -Patient had mild troponin elevation -No chest pain or shortness of breath -Echocardiogram shows EF of 65 to 70%    COPD -No coughing or wheezing noted -Continue ICS/LABA, as needed albuterol   Hypertension -Continue Norvasc   Hypothyroidism -Continue Synthroid -TSH 5.399   ?  UTI -Patient had abnormal UA, urine culture obtained showed no growth -She was empirically started on IV Rocephin -Rocephin was  discontinued  Low-grade fever -Patient temperature 100.4 yesterday -She has been afebrile this morning -Check CBC today  Patient will need to go to skilled nursing facility for rehab   Medications     amLODipine  10 mg Oral Daily   divalproex  250 mg Oral Q12H   enoxaparin (LOVENOX) injection  40 mg Subcutaneous Q24H   levothyroxine  50 mcg Oral q AM   mometasone-formoterol  2 puff Inhalation BID   montelukast  10 mg Oral QHS   pantoprazole  40 mg Oral Daily   QUEtiapine  25 mg Oral QHS   rosuvastatin  5 mg Oral Daily   sertraline  100 mg Oral q morning   sodium chloride flush  3 mL Intravenous Q12H     Data Reviewed:   CBG:  No results for input(s): GLUCAP in the last 168 hours.  SpO2: 93 %    Vitals:   12/15/21 0837 12/15/21 1524 12/15/21 1957 12/16/21 0536  BP: 110/62 (!) 96/48 130/76 124/90  Pulse:  (!) 54 68 83  Resp:  20 18 18   Temp:  (!) 100.4 F (38 C) 98.7 F (37.1 C) 98.2 F (36.8 C)  TempSrc:  Oral Oral Oral  SpO2:  92% 94% 93%  Weight:      Height:          Data Reviewed:  Basic Metabolic Panel: Recent Labs  Lab 12/11/21 0359 12/11/21 1153 12/11/21 1841 12/13/21 0610 12/14/21 0535  NA 144 147* 141 143 141  K 2.7* 3.6 3.1* 3.3* 3.6  CL 109 115* 115* 115* 109  CO2 27 26 24 22 24   GLUCOSE 102* 108* 121* 120* 83  BUN 14 11 14  <5* <5*  CREATININE 0.84 0.71 0.68 0.59 0.50  CALCIUM 8.6* 8.0* 8.0* 8.4* 8.7*  MG 2.0  --   --   --   --     CBC: Recent Labs  Lab 12/11/21 0359  WBC 5.8  NEUTROABS 2.7  HGB 15.4*  HCT 47.1*  MCV 86.4  PLT 220    LFT Recent Labs  Lab 12/11/21 0359  AST 30  ALT 22  ALKPHOS 58  BILITOT 0.8  PROT 7.0  ALBUMIN 3.4*     Antibiotics: Anti-infectives (From admission, onward)    Start     Dose/Rate Route Frequency Ordered Stop   12/12/21 1745  cefTRIAXone (ROCEPHIN) 1 g in sodium chloride 0.9 % 100 mL IVPB  Status:  Discontinued        1 g 200 mL/hr over 30 Minutes Intravenous Every 24 hours  12/12/21 1657 12/14/21 1348        DVT prophylaxis: Lovenox  Code Status: Full code  Family Communication: No family at bedside   CONSULTS psychiatry   Objective    Physical Examination:  General-appears agitated, refused to be examined  Status is: Inpatient: Suicidal ideation     [facility] If 7PM-7AM, please contact night-coverage at [email or web address], Office  [phone]   12/16/2021, 1:23 PM  LOS: 4 days

## 2021-12-16 NOTE — Progress Notes (Signed)
PT Cancellation Note  Patient Details Name: Jackie Garcia MRN: [id number] DOB: [birth year]   Cancelled Treatment:    Reason Eval/Treat Not Completed: Patient declined, no reason specified. Pt refuses to be seen by PT despite multiple attempts to encourage pt to mobilize. Will follow up as schedule allows.  Jamesetta Geralds, PT, DPT [facility] Rehabilitation Department Office: [phone] Pager: [phone]  Jamesetta Geralds 12/16/2021, 2:44 PM

## 2021-12-16 NOTE — Progress Notes (Signed)
Attempted to make sure pt was clean this AM. Pt refused to be checked. Pt states "I'm only damp. You're not checking me." Pt also refused morning medication. Told primary RN and NA to leave her alone. Plan of care ongoing.

## 2021-12-16 NOTE — Progress Notes (Signed)
Attempted to perform assessment on patient, as well as administer medications. Patient refused assessment and medications. Pt yelling and stating "I don't want that, leave me alone".

## 2021-12-17 DIAGNOSIS — Z515 Encounter for palliative care: Secondary | ICD-10-CM

## 2021-12-17 DIAGNOSIS — Z7189 Other specified counseling: Secondary | ICD-10-CM | POA: Diagnosis not present

## 2021-12-17 DIAGNOSIS — R627 Adult failure to thrive: Secondary | ICD-10-CM | POA: Diagnosis not present

## 2021-12-17 DIAGNOSIS — E876 Hypokalemia: Secondary | ICD-10-CM | POA: Diagnosis not present

## 2021-12-17 DIAGNOSIS — F03918 Unspecified dementia, unspecified severity, with other behavioral disturbance: Secondary | ICD-10-CM | POA: Diagnosis not present

## 2021-12-17 LAB — BASIC METABOLIC PANEL
Anion gap: 8 (ref 5–15)
BUN: 16 mg/dL (ref 8–23)
CO2: 22 mmol/L (ref 22–32)
Calcium: 8.4 mg/dL — ABNORMAL LOW (ref 8.9–10.3)
Chloride: 107 mmol/L (ref 98–111)
Creatinine, Ser: 0.71 mg/dL (ref 0.44–1.00)
GFR, Estimated: >60 mL/min (ref >60)
Glucose, Bld: 84 mg/dL (ref 70–99)
Potassium: 4.7 mmol/L (ref 3.5–5.1)
Sodium: 137 mmol/L (ref 135–145)

## 2021-12-17 LAB — HEPATIC FUNCTION PANEL
ALT: 19 U/L (ref 0–44)
AST: 32 U/L (ref 15–41)
Albumin: 2.8 g/dL — ABNORMAL LOW (ref 3.5–5.0)
Alkaline Phosphatase: 54 U/L (ref 38–126)
Bilirubin, Direct: 0.4 mg/dL — ABNORMAL HIGH (ref 0.0–0.2)
Indirect Bilirubin: 1 mg/dL — ABNORMAL HIGH (ref 0.3–0.9)
Total Bilirubin: 1.4 mg/dL — ABNORMAL HIGH (ref 0.3–1.2)
Total Protein: 6.5 g/dL (ref 6.5–8.1)

## 2021-12-17 LAB — AMMONIA: Ammonia: 33 umol/L (ref 9–35)

## 2021-12-17 LAB — VITAMIN B12: Vitamin B-12: 1947 pg/mL — ABNORMAL HIGH (ref 180–914)

## 2021-12-17 LAB — MAGNESIUM: Magnesium: 2 mg/dL (ref 1.7–2.4)

## 2021-12-17 MED ORDER — POTASSIUM CHLORIDE 20 MEQ PO PACK: 40.0000 meq | PACK | Freq: Once | ORAL | Status: DC

## 2021-12-17 MED ORDER — THIAMINE HCL 100 MG/ML IJ SOLN
100.0000 mg | Freq: Every day | INTRAMUSCULAR | Status: DC
  Administered 2021-12-17 – 2021-12-18 (×2): 100 mg via INTRAVENOUS
  Filled 2021-12-17 (×5): qty 2

## 2021-12-17 MED ORDER — MIRTAZAPINE 15 MG PO TBDP
7.5000 mg | ORAL_TABLET | Freq: Every day | ORAL | Status: DC
  Administered 2021-12-18 – 2021-12-24 (×6): 7.5 mg via ORAL
  Filled 2021-12-17 (×8): qty 0.5

## 2021-12-17 MED ORDER — VALPROATE SODIUM 100 MG/ML IV SOLN
250.0000 mg | Freq: Two times a day (BID) | INTRAVENOUS | Status: DC
  Administered 2021-12-17 – 2021-12-18 (×4): 250 mg via INTRAVENOUS
  Filled 2021-12-17 (×8): qty 2.5

## 2021-12-17 NOTE — Progress Notes (Signed)
PROGRESS NOTE   Jackie Garcia  [id number]    DOB: Feb 28, 1941    DOA: 12/10/2021  PCP: Pcp, No   I have briefly reviewed patients previous medical records in [facility].  Chief Complaint  Patient presents with   Diarrhea    Brief Narrative:  81 year old female, lives with her granddaughter/healthcare power of attorney PTA, medical history significant for COPD, depression, anxiety, dementia, hypothyroidism was sent to the ED under IVC after she threatened to kill herself with a knife.  As per family's report, has not been eating or drinking, refusing meds, swelling herself with feces, general failure to thrive for the last 1 to 2 months.  She was admitted with hypokalemia (potassium 2.7), suicidal ideation and A-fib.  Cardiology and psychiatry consulted and signed off.  Hospital course complicated by ongoing refusal of oral intake, meds, behavioral issues i.e. hitting/biting/scratching at nursing staff.  Palliative care consulted for Dor[place] discussions.  TOC on board for SNF but no bed offers thus far.   Assessment & Plan:  Principal Problem:   Hypokalemia Active Problems:   COPD (chronic obstructive pulmonary disease) (HCC)   Hypothyroidism   Depression   Essential hypertension   Elevated troponin   Dehydration   Adult failure to thrive: Secondary to progressive/advanced dementia with behavioral abnormalities complicating advanced age. Communicating with psychiatry to see if her medications can be further optimized which in turn hopefully can help with her oral intake and behavioral issues. Palliative care consulted to discuss goals of care with family.  If she continues to have poor oral intake, at risk for further decline and even death.  I have updated both patient's daughter and granddaughter who is the healthcare power of attorney with the same.  I do not believe that alternative means of nutrition i.e. tube feeding would be in patient's best interest.  Dementia with  behavioral abnormalities: Was on Zoloft daily and trazodone at bedtime as needed. Started here on Depakote to 50 Mg twice daily and Seroquel 25 Mg at bedtime. Check B12, B1, RPR. TSH mildly elevated but also free T4 up. Ongoing behavioral issues as noted above.  Communicated with psychiatry.  As per recommendations, changing Depakote to IV, check magnesium, replace K and magnesium to greater than 4 and 2 respectively, DC as needed trazodone, started Remeron 7.5 g nightly for mood and appetite.  They will see formally on 5/31. Has tele sitter for safety but will likely have to be off of it for 24 hours if going to SNF.  Hypokalemia Replete to maintain K >4 and magnesium >2.  Suicidal ideation Patient came in IVC. Psychiatry consulted 5/25, did not feel that she was suicidal and signed off.  Paroxysmal atrial fibrillation EKG showed A-fib with heart rate of 47. TSH 5.399. TTE showed LVEF 65 to 70% and grade 1 diastolic dysfunction. CHA2DS2-VASc score 5 Cardiology consulted, patient had brief episode of A-fib and now back in normal sinus rhythm.  No beta-blockers indicated. Agree with cardiology that patient is not a candidate for anticoagulation due to advanced dementia, noncompliance. As per cardiology, no need to follow-up with them.  Troponin elevation Mild, not consistent with ACS. No angina. TTE showed normal EF. Suspected due to brief A-fib  Hypertension: Controlled.  Continue amlodipine if patient takes  Hyperlipidemia: Continue statins if patient takes.  Hypothyroidism: Continue Synthroid. TSH 5.4 and may be elevated in the context of noncompliance. Consider repeating TSH in 4 to 6 weeks after consistent compliance with meds.  Prolonged QTc  Replace K and magnesium as noted above. Follow EKG in AM. Minimize QTc prolonging medications as much as able.  COPD: No clinical bronchospasm.  Body mass index is 33.51 kg/m.     DVT prophylaxis: enoxaparin (LOVENOX)  injection 40 mg Start: 12/11/21 1000     Code Status: Full Code:  Family Communication: Discussed in detail with patient's daughter via phone this morning and subsequently with granddaughter/healthcare power of attorney this afternoon.  Updated care and answered all questions. Disposition:  Status is: Inpatient Ongoing refusal to p.o. intake, meds, adjusting meds, awaiting palliative care consultation for goals of care.     Consultants:   Cardiology Psychiatry Palliative care  Procedures:   None  Antimicrobials:   None at this time   Subjective:  As per nursing report, ongoing significant behavioral issues, attempting to hit, bite, scratch nursing staff, also verbally aggressive, continuing to yell and curse at staff.  When I went into her room, noted similar behavior, yelling at top of her voice, states that I am not her doctor, told me to "get the hell out of here".  Refusing history and definitely does not appear that she will cooperate with exam.  Objective:   Vitals:   12/15/21 1524 12/15/21 1957 12/16/21 0536 12/16/21 2235  BP: (!) 96/48 130/76 124/90 105/65  Pulse: (!) 54 68 83 62  Resp: 20 18 18    Temp: (!) 100.4 F (38 C) 98.7 F (37.1 C) 98.2 F (36.8 C) 97.7 F (36.5 C)  TempSrc: Oral Oral Oral Oral  SpO2: 92% 94% 93%   Weight:      Height:        General exam: Elderly female, moderately built and nourished, lying comfortably supine in bed without distress.  Did not feel comfortable or safe examining her. Central nervous system: Alert and only oriented to self. No focal neurological deficits. Extremities: Symmetric 5 x 5 power. Psychiatry: Judgement and insight impaired. Mood & affect angry and verbally abusive.     Data Reviewed:   I have personally reviewed following labs and imaging studies   CBC: Recent Labs  Lab 12/11/21 0359 12/16/21 1401  WBC 5.8 10.0  NEUTROABS 2.7  --   HGB 15.4* 14.8  HCT 47.1* 43.2  MCV 86.4 85.4  PLT 220 218     Basic Metabolic Panel: Recent Labs  Lab 12/11/21 0359 12/11/21 1153 12/11/21 1841 12/13/21 0610 12/14/21 0535 12/17/21 0927  NA 144 147* 141 143 141 137  K 2.7* 3.6 3.1* 3.3* 3.6 4.7  CL 109 115* 115* 115* 109 107  CO2 27 26 24 22 24 22   GLUCOSE 102* 108* 121* 120* 83 84  BUN 14 11 14  <5* <5* 16  CREATININE 0.84 0.71 0.68 0.59 0.50 0.71  CALCIUM 8.6* 8.0* 8.0* 8.4* 8.7* 8.4*  MG 2.0  --   --   --   --   --     Liver Function Tests: Recent Labs  Lab 12/11/21 0359  AST 30  ALT 22  ALKPHOS 58  BILITOT 0.8  PROT 7.0  ALBUMIN 3.4*    CBG: No results for input(s): GLUCAP in the last 168 hours.  Microbiology Studies:   Recent Results (from the past 240 hour(s))  Urine Culture     Status: None   Collection Time: 12/12/21  6:23 PM   Specimen: Urine, Clean Catch  Result Value Ref Range Status   Specimen Description   Final    URINE, CLEAN CATCH Performed at [facility], [place] [address], [place], [place] [address]    Special Requests   Final    NONE Performed at [facility], [place] [address], [place], [place] [address]    Culture   Final    NO GROWTH Performed at [facility], [place] [address], [place], [place] [address]    Report Status 12/13/2021 FINAL  Final    Radiology Studies:  No results found.  Scheduled Meds:    amLODipine  10 mg Oral Daily   divalproex  250 mg Oral Q12H   enoxaparin (LOVENOX) injection  40 mg Subcutaneous Q24H   levothyroxine  50 mcg Oral q AM   mometasone-formoterol  2 puff Inhalation BID   montelukast  10 mg Oral QHS   pantoprazole  40 mg Oral Daily   QUEtiapine  25 mg Oral QHS   rosuvastatin  5 mg Oral Daily   sertraline  100 mg Oral q morning   sodium chloride flush  3 mL Intravenous Q12H    Continuous Infusions:     LOS: 5 days     Vernell Leep, MD,  FACP, [facility], [facility], [facility] (Care Management Physician Certified) [place]  To  contact the attending provider between 7A-7P or the covering provider during after hours 7P-7A, please log into the web site [email or web address] and access using universal [place] password for that web site. If you do not have the password, please call the hospital operator.  12/17/2021, 1:09 PM

## 2021-12-17 NOTE — Progress Notes (Signed)
PT Cancellation Note  Patient Details Name: Jackie Garcia MRN: [id number] DOB: 1940-09-18   Cancelled Treatment:    Reason Eval/Treat Not Completed: Patient declined, no reason specified (Pt reporting she can't walk, her arm hurts. Provided alternate therapy interventions (exercises, assistance to bathroom, etc.) pt refused all. Pt asked for sprite, provided to pt. Will follow up as schedule and pt status allows.)  Jamesetta Geralds, PT, DPT [facility] Rehabilitation Department Office: [phone] Pager: [phone]  Jamesetta Geralds 12/17/2021, 10:46 AM

## 2021-12-17 NOTE — Progress Notes (Signed)
Pt refused all care throughout the night and this AM. Pt swung and kicked at primary RN and CNA when attempting to provide care. Plan of care ongoing.

## 2021-12-17 NOTE — TOC Progression Note (Addendum)
Transition of Care [facility]) - Progression Note    Patient Details  Name: TOYOKO SILOS MRN: [id number] Date of Birth: 1940-10-17  Transition of Care [facility]) CM/SW Contact  [name], [place] Phone Number: 12/17/2021, 3:47 PM  Clinical Narrative:     CSW sent updated clinicals to SNFs still waiting for bed offers.  4:15pm  Previous hospitalization at [facility] patient's family had arranged to have her go to a group home called [facility].  CSW attempted to contact [name] at group home [phone] to see if they would still be willing to consider taking patient if family agreeable.  Left a message with staff member who will contact [name] and have her call CSW back.  CSW to continue to follow patient's progress throughout discharge planning.  4:55pm CSW spoke to patient's granddaughter [name] to update her on patient's placement barriers.  Patient's granddaughter stated that patient did not have as many behavioral issues as she does now.  She does not know what to do with patient, per granddaughter, patient is not safe to come back home and will need placement from hospital.  CSW informed her that psych has been reconsulted per MD note and they are also adjusting medications.  CSW to continue to follow patient's progress throughout discharge planning.  Pending updated psych notes as well.     Expected Discharge Plan:  (Undetermined) Barriers to Discharge: Family Issues, Unsafe home situation  Expected Discharge Plan and Services Expected Discharge Plan:  (Undetermined)                                               Social Determinants of Health (SDOH) Interventions    Readmission Risk Interventions     View : No data to display.

## 2021-12-17 NOTE — Consult Note (Signed)
Consultation Note Date: 12/17/2021   Patient Name: Jackie Garcia  DOB: 21-Jan-1941  MRN: [id number]  Age / Sex: 81 y.o., female  PCP: Pcp, No Referring Physician: Modena Jansky, MD  Reason for Consultation: Establishing goals of care  HPI/Patient Profile: 81 y.o. female  with past medical history of COPD, depression, anxiety, dementia, hypothyroidism admitted on 12/10/2021 under IVC admission after threatening to kill herself with a knife.  Family reports beginning last Saturday that she had been refusing to eat or drink and had become incontinent and was spreading fecal material all over the house.  She was admitted with hypokalemia, A-fib, and concern for suicidal ideation.  Cardiology and psychiatry have both evaluated and signed off.  Noted continued concern about intake with refusal of oral intake or medications.  Combative at times as well.  She was noted today to have eaten a hamburger by bedside RN.  Palliative consulted for goals of care.  Clinical Assessment and Goals of Care: Palliative care consult received.  Chart reviewed including personal review of pertinent labs and imaging.  I met today with patient's granddaughter, Marzetta Board, outside of the room.  She reports that she is Ms. Gary's HCPOA and they had completed documentation of this through a lawyer earlier this year.  I introduced palliative care as specialized medical care for people living with serious illness. It focuses on providing relief from the symptoms and stress of a serious illness. The goal is to improve quality of life for both the patient and the family.  States he reports this Ms. Barba has 5 children and has lived with different people in the family in the past but she has been residing with Marzetta Board as other family members over the longer able/willing to assist in caring for her.  Stacy and I discussed continued changes  in her nutrition, cognition, and functional status over the past few months with acute worsening last week.  Discussed concerns regarding her lack of nutrition and that I thought this would be a likely driving factor in her prognosis moving forward.  Discussed that she did eat some today but prior to that had not been eating for the last several days.  Marzetta Board notes that she is Ms. Deboard's healthcare power of attorney and they had completed some documentation at a lawyer in the past.  I asked her if she would be able to review this or bring it so that we could take a look to see exactly what Ms. Carneiro had outlined in her wishes.    We also had preliminary discussion regarding potential limitations of care.  We discussed plan for me to stop by to assess Ms. Ozaki later this afternoon once family had left to see if she is more willing to talk with me.  Reviewed consideration for limitations of care including frequent decisions that arise for patients and their family including things such as CPR, life support, ICU level interventions, and feeding tube.  Following discussion, I provided Marzetta Board with a copy of hard choices for  loving people for her to review.  I told her that I would call her tomorrow to touch base and continue discussion after had a chance to see how much Ms. Roane were willing to speak with me later today. (At this point, family have been working to help bathe her and she is quite agitated).  I followed up in the afternoon with Ms. Diebel.  She was lying in bed but woke easily.  Short and agitated throughout encounter.  She reports eating lunch as it is "the only decent thing that anyone has brought to me in days."  States the nurse ate her breakfast this morning rather than giving it to her.  Questioned who sent me to see her and became agitated and not want to discuss with me any further.   Questions and concerns addressed.   PMT will continue to support  holistically.  SUMMARY OF RECOMMENDATIONS   -Full code/full scope -Patient's granddaughter, Marzetta Board, as her surrogate Media planner.  I have asked her if she can provide a copy of paperwork completed at lawyer to allow [place] to review together and have a copy on file. -Agree that it would be helpful to have psychiatry weigh in again.  In talking with her granddaughter, it seems as though there was an acute change in her mood and behavior last Saturday.  Marzetta Board reports that she has been hopeful that finding the correct medication regimen would allow her to be able to be cared for by family at home.  Marzetta Board also is very clear with me that they would not be able to meet her care needs at her home in her current state (she has 2 small children and her primary duty remains caring for keeping them safe). - Palliative to continue to follow.  Code Status/Advance Care Planning: Full code   Symptom Management:  Behavioral issues: Dr. Algis Liming has discussed today with psychiatry and changes as per psychiatry recommendations  Palliative Prophylaxis:  Standard delirium management (adapted from NICE guidelines 2011 for prevention of delirium):  Provide continuity of care when possible (avoid frequent changing of surroundings and staff).  Frequent reorientation to time with:  A clock should always be visible.  Make sure Calendar/white board is updated.  Lights on/blinds open during the day and off/closed at night.  Encourage frequent family visits.  Monitor for and treat dehydration/constipation.  Optimize oxygen saturation.  Avoid catheters and IV's when possible and look for/treat infections.  Encourage early mobility.  Assess and treat pain.  Ensure adequate nutrition and functioning dentures.  Address reversible causes of hearing and visual impairment:  Use pocket talker if hearing aids are unavailable.  Avoid sleep disturbance (normalize sleep/wake cycle).  Minimize disturbances and consider NOT  obtaining vitals at night if possible.  Review Medications to avoid polypharmacy and avoid deliriogenic medications when possible:  Benzodiazepines.  Dihydropyridines.  Antihistamines.  Anticholinergics.  (Possibly avoid: H2 blockers, tricyclic antidepressants, antiparkinson medications, steroids, NSAID's).   -*  Additional Recommendations (Limitations, Scope, Preferences): Full Scope Treatment Prognosis:  Unable to determine as this will largely depend upon her nutritional status moving forward  Discharge Planning: [place] for rehab with Palliative care service follow-up      Primary Diagnoses: Present on Admission:  Hypokalemia  COPD (chronic obstructive pulmonary disease) ([place])  Hypothyroidism  Essential hypertension  Elevated troponin  Depression  Dehydration   I have reviewed the medical record, interviewed the patient and family, and examined the patient. The following aspects are pertinent.  Past  Medical History:  Diagnosis Date   Emphysema lung ([place])    Family history of adverse reaction to anesthesia    Daughter - PONV   GERD (gastroesophageal reflux disease)    Hypothyroidism    Thyroid disease    Social History   Socioeconomic History   Marital status: Divorced    Spouse name: Not on file   Number of children: Not on file   Years of education: Not on file   Highest education level: Not on file  Occupational History   Not on file  Tobacco Use   Smoking status: Every Day    Packs/day: 0.10    Years: 40.00    Pack years: 4.00    Types: Cigarettes   Smokeless tobacco: Never   Tobacco comments:    Started smoking in her "65s".  Was 1 PPD.  Now down to 1-2 cigs/day.  Substance and Sexual Activity   Alcohol use: Never   Drug use: Never   Sexual activity: Not on file  Other Topics Concern   Not on file  Social History Narrative   Not on file   Social Determinants of Health   Financial Resource Strain: Not on file  Food  Insecurity: Not on file  Transportation Needs: Not on file  Physical Activity: Not on file  Stress: Not on file  Social Connections: Not on file   Family History  Problem Relation Age of Onset   Lung cancer Father    Diabetes Mellitus II Sister    Scheduled Meds:  amLODipine  10 mg Oral Daily   enoxaparin (LOVENOX) injection  40 mg Subcutaneous Q24H   levothyroxine  50 mcg Oral q AM   mirtazapine  7.5 mg Oral QHS   mometasone-formoterol  2 puff Inhalation BID   montelukast  10 mg Oral QHS   pantoprazole  40 mg Oral Daily   QUEtiapine  25 mg Oral QHS   rosuvastatin  5 mg Oral Daily   sertraline  100 mg Oral q morning   sodium chloride flush  3 mL Intravenous Q12H   thiamine injection  100 mg Intravenous Daily   Continuous Infusions:  valproate sodium     PRN Meds:.acetaminophen **OR** acetaminophen, albuterol, ondansetron **OR** ondansetron (ZOFRAN) IV Medications Prior to Admission:  Prior to Admission medications   Medication Sig Start Date End Date Taking? Authorizing Provider  albuterol (VENTOLIN HFA) 108 (90 Base) MCG/ACT inhaler Inhale 2 puffs into the lungs every 4 (four) hours as needed for wheezing or shortness of breath. 04/20/21  Yes Melynda Ripple, MD  amLODipine (NORVASC) 10 MG tablet Take 10 mg by mouth daily. 10/16/21  Yes [provider]  levothyroxine (SYNTHROID) 50 MCG tablet Take 50 mcg by mouth in the morning. 08/26/21  Yes [provider]  sertraline (ZOLOFT) 100 MG tablet Take 100 mg by mouth every morning. 08/19/21 08/19/22 Yes [provider]  SYMBICORT 160-4.5 MCG/ACT inhaler Inhale 2 puffs into the lungs 2 (two) times daily. 05/27/21  Yes [provider]  traZODone (DESYREL) 100 MG tablet Take 100 mg by mouth at bedtime as needed. 08/18/21  Yes [provider]  [employer] PURELAX 17 g packet Take 17 g by mouth daily. Patient not taking: Reported on 12/11/2021 10/16/21   [provider]  [employer] SENNA 8.6 MG tablet  Take 2 tablets by mouth at bedtime. Patient not taking: Reported on 12/11/2021 10/16/21   [provider]  montelukast (SINGULAIR) 10 MG tablet Take 10 mg by mouth daily.  Patient not taking: Reported on 12/11/2021 08/19/21   [provider]  Multiple Vitamin (MULTIVITAMIN PO) Take 1 tablet by mouth daily. Patient not taking: Reported on 12/11/2021    [provider]  omeprazole (PRILOSEC) 20 MG capsule Take 20 mg by mouth daily. Patient not taking: Reported on 12/11/2021 05/08/20   [provider]  rosuvastatin (CRESTOR) 5 MG tablet Take 5 mg by mouth in the morning. Patient not taking: Reported on 12/11/2021 09/19/21   [provider]  Spacer/Aero-Holding Chambers (AEROCHAMBER PLUS) inhaler Use with inhaler 04/20/21   Melynda Ripple, MD   Allergies  Allergen Reactions   Naproxen Swelling   Sulfa Antibiotics Rash   Codeine Itching   Review of Systems Refused answer  Physical Exam General: Sleepy but arouses easily  Skin: Warm and dry Neuro: Grossly intact, nonfocal. Psych: Agitated  Vital Signs: BP 105/65 (BP Location: Right Leg)   Pulse 62   Temp 97.7 F (36.5 C) (Oral)   Resp 18   Ht 5' 2"  (1.575 m)   Wt 83.1 kg   SpO2 93%   BMI 33.51 kg/m  Pain Scale: PAINAD POSS *See Group Information*: S-Acceptable,Sleep, easy to arouse Pain Score: 0-No pain   SpO2: SpO2: 93 % O2 Device:SpO2: 93 % O2 Flow Rate: .   IO: Intake/output summary:  Intake/Output Summary (Last 24 hours) at 12/17/2021 1342 Last data filed at 12/17/2021 1300 Gross per 24 hour  Intake 996.03 ml  Output --  Net 996.03 ml    LBM: Last BM Date : 12/14/21 Baseline Weight: Weight: 83.1 kg Most recent weight: Weight: 83.1 kg     Palliative Assessment/Data:   Flowsheet Rows    Flowsheet Row Most Recent Value  Intake Tab   Referral Department Hospitalist  Unit at Time of Referral Med/Surg Unit  Palliative Care Primary Diagnosis Neurology  Date Notified  12/17/21  Palliative Care Type New Palliative care  Reason for referral Clarify Goals of Care  Date of Admission 12/10/21  Date first seen by Palliative Care 12/17/21  # of days Palliative referral response time 0 Day(s)  # of days IP prior to Palliative referral 7  Clinical Assessment   Palliative Performance Scale Score 40%  Psychosocial & Spiritual Assessment   Social Work Plan of Care Clarified patient/family wishes with healthcare team  Palliative Care Outcomes   Patient/Family meeting held? Yes  Who was at the meeting? Granddaughter       Time In: 1250 Time Out: 1410 Time Total: 89 Greater than 50%  of this time was spent counseling and coordinating care related to the above assessment and plan.  Signed by: Micheline Rough, MD   Please contact Palliative Medicine Team phone at [phone] for questions and concerns.  For individual provider: See Shea Evans

## 2021-12-17 NOTE — Progress Notes (Signed)
Patient continues to refuse medications and care. If care is attempted by RN or tech patient attempts to hit, bite, and scratch. Patient is also verbally aggressive and continues to yell and cuss at staff. MD is aware of patient continued refusal of care and meds.

## 2021-12-18 DIAGNOSIS — Z515 Encounter for palliative care: Secondary | ICD-10-CM | POA: Diagnosis not present

## 2021-12-18 DIAGNOSIS — Z7189 Other specified counseling: Secondary | ICD-10-CM | POA: Diagnosis not present

## 2021-12-18 DIAGNOSIS — E876 Hypokalemia: Secondary | ICD-10-CM | POA: Diagnosis not present

## 2021-12-18 DIAGNOSIS — F03918 Unspecified dementia, unspecified severity, with other behavioral disturbance: Secondary | ICD-10-CM | POA: Diagnosis not present

## 2021-12-18 LAB — RPR: RPR Ser Ql: NONREACTIVE

## 2021-12-18 LAB — CREATININE, SERUM
Creatinine, Ser: 0.63 mg/dL (ref 0.44–1.00)
GFR, Estimated: >60 mL/min (ref >60)

## 2021-12-18 MED ORDER — HALOPERIDOL LACTATE 5 MG/ML IJ SOLN
1.0000 mg | Freq: Four times a day (QID) | INTRAMUSCULAR | Status: DC | PRN
  Administered 2021-12-21 – 2021-12-24 (×5): 1 mg via INTRAVENOUS
  Filled 2021-12-18 (×5): qty 1

## 2021-12-18 NOTE — Care Management Important Message (Signed)
Important Message  Patient Details IM Letter given to the Patient. Name: Jackie Garcia MRN: [id number] Date of Birth: 1940/11/14   Medicare Important Message Given:  Yes     [name] 12/18/2021, 10:52 AM

## 2021-12-18 NOTE — Progress Notes (Signed)
Daily Progress Note   Patient Name: Jackie Garcia       Date: 12/18/2021 DOB: 12-08-1940  Age: 81 y.o. MRN#: [id number] Attending Physician: Elease Etienne, MD Primary Care Physician: Pcp, No Admit Date: 12/10/2021  Reason for Consultation/Follow-up: Establishing goals of care  Subjective: I saw and examined Jackie Garcia today.  She was awake and alert at time of my encounter.  She does engage more with me today, although she remains easily agitated with any questioning.  She states she feels she should be able to choose her own doctors and asks how I got assigned to her case.  Discussed with her that I am working as a Therapist, music to act as another layer of support and help determine things most important to her in regard to her goals moving forward.  We discussed nutrition and she reports that she has not been eating because people keep coming and taking her food.  Reports she has not had anything that she likes to eat but even if she was going to eat something the opportunity does not arise as either her nurse or member of her family comes and eats her food before she has the chance to do so.  She tells me she has been living with her granddaughter Jackie Garcia but she is being treated "horribly" and they do not allow her to eat or use the bathroom.  I asked her to expand further on this but she declined to do so.  I called and spoke with patient's granddaughter, Jackie Garcia.  We reviewed again her clinical course and concerns about nutrition, cognition, and functional status.  Jackie Garcia states that she is trying to figure out what to do and is been working in conjunction with social work regarding discharge options.  Discussed with her concern that if nutrition continues to be an issue, this  may be an early sign that we are actually approaching end-of-life situation for Jackie Garcia.  We discussed again regarding limitations of care including consideration about the burdens and benefits of intervention such as feeding tubes, dialysis, life support, and CPR.  Length of Stay: 6  Current Medications: Scheduled Meds:   amLODipine  10 mg Oral Daily   enoxaparin (LOVENOX) injection  40 mg Subcutaneous Q24H   levothyroxine  50 mcg Oral q AM  mirtazapine  7.5 mg Oral QHS   mometasone-formoterol  2 puff Inhalation BID   montelukast  10 mg Oral QHS   pantoprazole  40 mg Oral Daily   rosuvastatin  5 mg Oral Daily   sertraline  100 mg Oral q morning   sodium chloride flush  3 mL Intravenous Q12H   thiamine injection  100 mg Intravenous Daily    Continuous Infusions:  valproate sodium 250 mg (12/18/21 1353)    PRN Meds: acetaminophen **OR** acetaminophen, albuterol, haloperidol lactate, ondansetron **OR** ondansetron (ZOFRAN) IV  Physical Exam   Declined exam        Vital Signs: BP 118/62 (BP Location: Right Arm)   Pulse 68   Temp 98.2 F (36.8 C) (Oral)   Resp 16   Ht 5\' 2"  (1.575 m)   Wt 83.1 kg   SpO2 99%   BMI 33.51 kg/m  SpO2: SpO2: 99 % O2 Device: O2 Device: Room Air O2 Flow Rate:    Intake/output summary:  Intake/Output Summary (Last 24 hours) at 12/18/2021 1442 Last data filed at 12/18/2021 1400 Gross per 24 hour  Intake 253.45 ml  Output --  Net 253.45 ml   LBM: Last BM Date : 12/14/21 Baseline Weight: Weight: 83.1 kg Most recent weight: Weight: 83.1 kg       Palliative Assessment/Data:    Flowsheet Rows    Flowsheet Row Most Recent Value  Intake Tab   Referral Department Hospitalist  Unit at Time of Referral Med/Surg Unit  Palliative Care Primary Diagnosis Neurology  Date Notified 12/17/21  Palliative Care Type New Palliative care  Reason for referral Clarify Goals of Care  Date of Admission 12/10/21  Date first seen by Palliative Care  12/17/21  # of days Palliative referral response time 0 Day(s)  # of days IP prior to Palliative referral 7  Clinical Assessment   Palliative Performance Scale Score 40%  Psychosocial & Spiritual Assessment   Social Work Plan of Care Clarified patient/family wishes with healthcare team  Palliative Care Outcomes   Patient/Family meeting held? Yes  Who was at the meeting? Granddaughter       Patient Active Problem List   Diagnosis Date Noted   COPD (chronic obstructive pulmonary disease) (HCC) 10/07/2020   Depression 11/07/2021   Hypothyroidism 10/08/2020   Dehydration 12/12/2021   Hypokalemia 12/11/2021   Elevated troponin 12/11/2021   Essential hypertension 11/07/2021   Dyslipidemia 11/07/2021   Tobacco abuse 10/07/2020   Tobacco use disorder 10/07/2020    Palliative Care Assessment & Plan   Patient Profile: 81 y.o. female  with past medical history of COPD, depression, anxiety, dementia, hypothyroidism admitted on 12/10/2021 under IVC admission after threatening to kill herself with a knife.  Family reports beginning last Saturday that she had been refusing to eat or drink and had become incontinent and was spreading fecal material all over the house.  She was admitted with hypokalemia, A-fib, and concern for suicidal ideation.  Cardiology and psychiatry have both evaluated and signed off.  Noted continued concern about intake with refusal of oral intake or medications.  Combative at times as well.  Palliative consulted for goals of care.  Recommendations/Plan: Full code/full scope Per staff, she is a little more agreeable today.  Continue to monitor clinical course and will see if further recommendations after she is reevaluated by psychiatry. Continue discussion with patient's granddaughter regarding long-term goals of care.  At time of my encounter, she did not appear to have full insight into  her overall condition, however, this is certainly something that can change  throughout course of the day. Discussed limitations of care with her granddaughter.  She agrees that further conversation should be had regarding limitations of care and she is going to consider further.  I discussed with her specifically that I did not think that a feeding tube would be beneficial in adding time and quality to Jackie Garcia's life if she continues to refuse oral intake. Palliative to continue to follow.  Goals of Care and Additional Recommendations: Limitations on Scope of Treatment: Full Scope Treatment  Code Status:    Code Status Orders  (From admission, onward)           Start     Ordered   12/11/21 0612  Full code  Continuous        12/11/21 0614           Code Status History     Date Active Date Inactive Code Status Order ID Comments User Context   11/06/2021 2319 11/19/2021 1911 Full Code [id number]  Hannah BeatMansy, Jan A, MD ED   10/08/2020 0628 10/10/2020 0011 Full Code [id number]  Therisa Doyneoutova, Anastassia, MD ED       Prognosis:  Unable to determine  Discharge Planning: To Be Determined  Care plan was discussed with patient, granddaughter  Thank you for allowing the Palliative Medicine Team to assist in the care of this patient.  Total time: 55 minutes  Romie MinusGene Amadu Schlageter, MD  Please contact Palliative Medicine Team phone at [phone] for questions and concerns.

## 2021-12-18 NOTE — TOC Progression Note (Addendum)
Transition of Care [facility]) - Progression Note    Patient Details  Name: Jackie Garcia MRN: [id number] Date of Birth: 03/25/1941  [facility] CM/SW Contact  [name], [place] Phone Number: 12/18/2021, 12:27 PM  Clinical Narrative:     Per patient's granddaughter [name] [phone], they would still like patient to go to SNF for rehab if she is approved.    CSW contacted group home as a back up plan in case SNF can not take patient.  CSW contacted patient's granddaughter [name] [phone] and told her that CSW spoke to group home and asked them to review patient.  Per [name], patient was supposed to go to group home last hospitalization, however she did not think patient would do well at group home, so she decided to have her stay at her home.  Per [name] patient did well for a couple of weeks, but then started getting angry and aggressive so she brought patient to ED under IVC originally.  Psych saw patient and cleared her from needing IVC.  CSW talked to granddaughter and explained to her that patient has been refusing to work with therapy, and since she has managed Medicare, they may not approve her.  CSW also informed patient's granddaughter, that there are not any snfs that are willing to accept patient yet.  CSW has sent patient's information to 40 different SNFs.  CSW asked granddaughter what is the plan if patient is denied by insurance and does not have any SNF offers.  Patient's daughter explained she is not sure, and will talk with her husband to discuss plan.  CSW told her that when patients are in hospital, TOC does not normally assist with group home placement.  CSW informed her that if patient is denied by insurance company for SNF placement, the only other option would be paying privately for SNF or having her return home with home health.  CSW informed her that [facility] social worker can assist with placement to group home if needed.  Per patient's granddaughter  patient gets $1800 a month for social security.  CSW informed her that even if patient does get approved for SNF, ALF, or group home, the check will have to go to the facility, and then if approved for Medicaid, that would cover the rest of the cost, but they still need to apply for Medicaid.  Patient's granddaughter said she was going to reach out to DSS to discuss Medicaid application process.  CSW informed her there are other memory care group homes and some take Medicaid.  CSW advised that she contact DSS and start the Medicaid process, and tell them, she is looking for long term care Medicaid for a nursing home, or special assistance Medicaid for group home or ALF.  Patient's granddaughter said she may not qualify, CSW informed her there are different requirements for LTC and special assistance Medicaid.  CSW also was informed that psych will be seeing patient again.  TOC to continue to follow patient's progress throughout discharge planning.  CSW spoke to [name] [phone] at [facility] group home to discuss if they could review patient.  CSW spoke to her and explained the patient's behaviors, diagnosis, psych notes, and patient's progress at hospital.  [name] requested clinicals be faxed to her.  CSW faxed requested clinicals so she can review and speak to the administrator.  Per [name], they are used to having patient's like her stay with them.  CSW asked [name] to review and then call  CSW back and let her know what she thinks about patient potentially going there.  Expected Discharge Plan:  (Undetermined) Barriers to Discharge: Family Issues, Unsafe home situation  Expected Discharge Plan and Services Expected Discharge Plan:  (Undetermined)                                               Social Determinants of Health (SDOH) Interventions    Readmission Risk Interventions     View : No data to display.

## 2021-12-18 NOTE — [place] FL2 (Signed)
[place] MEDICAID FL2 LEVEL OF CARE SCREENING TOOL     IDENTIFICATION  Patient Name: Jackie Garcia Birthdate: 02/16/1941 Sex: female Admission Date (Current Location): 12/10/2021  [facility] and [place] Number:  Producer, television/film/video and Address:  [facility],  [address] [place], [place] [address]      Provider Number: [id number]  Attending Physician Name and Address:  Elease Etienne, MD  Relative Name and Phone Number:  Gabriel Rung   [phone] Daughter, Tyron Russell [phone]),    Current Level of Care: Hospital Recommended Level of Care: [facility], Memory Care (Group Home) Prior Approval Number:    Date Approved/Denied:   PASRR Number: [id number]  Discharge Plan: Domiciliary (Rest home) (Group home)    Current Diagnoses: Patient Active Problem List   Diagnosis Date Noted   Dehydration 12/12/2021   Hypokalemia 12/11/2021   Elevated troponin 12/11/2021   Essential hypertension 11/07/2021   Dyslipidemia 11/07/2021   Depression 11/07/2021   Hypothyroidism 10/08/2020   COPD (chronic obstructive pulmonary disease) (HCC) 10/07/2020   Tobacco abuse 10/07/2020   Tobacco use disorder 10/07/2020    Orientation RESPIRATION BLADDER Height & Weight     Self, Situation, Place  Normal Incontinent Weight: 183 lb 3.2 oz (83.1 kg) Height:  5\' 2"  (157.5 cm)  BEHAVIORAL SYMPTOMS/MOOD NEUROLOGICAL BOWEL NUTRITION STATUS      Continent Diet (Regular diet)  AMBULATORY STATUS COMMUNICATION OF NEEDS Skin   Supervision (Standby assistance) Verbally Normal                       Personal Care Assistance Level of Assistance  Bathing, Feeding, Dressing, Total care Bathing Assistance: Limited assistance Feeding assistance: Independent Dressing Assistance: Limited assistance     Functional Limitations Info  Sight, Hearing, Speech Sight Info: Adequate Hearing Info: Adequate Speech Info: Adequate    SPECIAL CARE FACTORS FREQUENCY  PT  (By licensed PT), OT (By licensed OT)     PT Frequency: Minimum 2x a week OT Frequency: Minimum 2x a week            Contractures Contractures Info: Not present    Additional Factors Info  Code Status, Allergies, Psychotropic Code Status Info: Full Allergies Info: Naproxen, Sulfa Antibiotics, Codeine Psychotropic Info: mirtazapine (REMERON SOL-TAB) disintegrating tablet 7.5 mg, QUEtiapine (SEROQUEL) tablet 25 mg, sertraline (ZOLOFT) tablet 100 mg         Current Medications (12/18/2021):  This is the current hospital active medication list Current Facility-Administered Medications  Medication Dose Route Frequency Provider Last Rate Last Admin   acetaminophen (TYLENOL) tablet 650 mg  650 mg Oral Q6H PRN Opyd, 12/20/2021, MD   650 mg at 12/16/21 1629   Or   acetaminophen (TYLENOL) suppository 650 mg  650 mg Rectal Q6H PRN Opyd, 12/18/21, MD       albuterol (PROVENTIL) (2.5 MG/3ML) 0.083% nebulizer solution 2.5 mg  2.5 mg Inhalation Q4H PRN Opyd, Lavone Neri, MD       amLODipine (NORVASC) tablet 10 mg  10 mg Oral Daily Opyd, Lavone Neri, MD   10 mg at 12/16/21 1410   enoxaparin (LOVENOX) injection 40 mg  40 mg Subcutaneous Q24H Opyd, 12/18/21, MD   40 mg at 12/15/21 12/17/21   levothyroxine (SYNTHROID) tablet 50 mcg  50 mcg Oral q AM Opyd, 3664, MD   50 mcg at 12/15/21 0837   mirtazapine (REMERON SOL-TAB) disintegrating tablet 7.5 mg  7.5 mg Oral QHS Hongalgi, 12/17/21,  MD       mometasone-formoterol (DULERA) 200-5 MCG/ACT inhaler 2 puff  2 puff Inhalation BID Opyd, Lavone Neri, MD   2 puff at 12/14/21 2004   montelukast (SINGULAIR) tablet 10 mg  10 mg Oral QHS Opyd, Lavone Neri, MD   10 mg at 12/14/21 2146   ondansetron (ZOFRAN) tablet 4 mg  4 mg Oral Q6H PRN Opyd, Lavone Neri, MD       Or   ondansetron (ZOFRAN) injection 4 mg  4 mg Intravenous Q6H PRN Opyd, Lavone Neri, MD       pantoprazole (PROTONIX) EC tablet 40 mg  40 mg Oral Daily Opyd, Lavone Neri, MD   40 mg at 12/16/21 1410    QUEtiapine (SEROQUEL) tablet 25 mg  25 mg Oral QHS Meredeth Ide, MD   25 mg at 12/14/21 2146   rosuvastatin (CRESTOR) tablet 5 mg  5 mg Oral Daily Opyd, Lavone Neri, MD   5 mg at 12/16/21 1410   sertraline (ZOLOFT) tablet 100 mg  100 mg Oral q morning Opyd, Lavone Neri, MD   100 mg at 12/16/21 1410   sodium chloride flush (NS) 0.9 % injection 3 mL  3 mL Intravenous Q12H Opyd, Lavone Neri, MD   3 mL at 12/17/21 2158   thiamine (B-1) injection 100 mg  100 mg Intravenous Daily Marcellus Scott D, MD   100 mg at 12/17/21 1433   valproate (DEPACON) 250 mg in dextrose 5 % 50 mL IVPB  250 mg Intravenous Q12H Elease Etienne, MD 52.5 mL/hr at 12/17/21 2153 250 mg at 12/17/21 2153     Discharge Medications: Please see discharge summary for a list of discharge medications.  Relevant Imaging Results:  Relevant Lab Results:   Additional Information SSN [id number]  [name], [place]

## 2021-12-18 NOTE — Progress Notes (Addendum)
PT Cancellation Note  Patient Details Name: Jackie Garcia MRN: [id number] DOB: [birth year]   Cancelled Treatment:     Pt in bed eating lunch, watching TV.  Pt declined any OOB activity including toilet.  "I'm fine right where I am".  Per chart review, pt plans to D/C to a SNF.  Will continue to follow and attempt another time.  Felecia Shelling  PTA Acute  Rehabilitation Services Pager      [phone] Office      [phone]

## 2021-12-18 NOTE — Progress Notes (Signed)
PROGRESS NOTE   Jackie Garcia  [id number]    DOB: May 23, 1941    DOA: 12/10/2021  PCP: Pcp, No   I have briefly reviewed patients previous medical records in [facility].  Chief Complaint  Patient presents with   Diarrhea    Brief Narrative:  81 year old female, lives with her granddaughter/healthcare power of attorney PTA, medical history significant for COPD, depression, anxiety, dementia, hypothyroidism was sent to the ED under IVC after she threatened to kill herself with a knife.  As per family's report, has not been eating or drinking, refusing meds, swelling herself with feces, general failure to thrive for the last 1 to 2 months.  She was admitted with hypokalemia (potassium 2.7), suicidal ideation and A-fib.  Cardiology and psychiatry consulted and signed off.  Hospital course complicated by ongoing refusal of oral intake, meds, behavioral issues i.e. hitting/biting/scratching at nursing staff.  Palliative care consulted for [place] discussions.  TOC on board for SNF but no bed offers thus far.  Psychiatry reconsulted to assist with behavioral health issues.   Assessment & Plan:  Principal Problem:   Hypokalemia Active Problems:   COPD (chronic obstructive pulmonary disease) (HCC)   Hypothyroidism   Depression   Essential hypertension   Elevated troponin   Dehydration   Adult failure to thrive: Secondary to progressive/advanced dementia with behavioral abnormalities complicating advanced age. Although granddaughter indicated to palliative care MD that her mental status changes were acute since Saturday prior to admission, these appear to be longer of at least 2 months duration. Seen by psychiatry on 11/06/2021 when she presented to [facility] ED similar to current presentation, via law enforcement and under IVC.  At that time as well she was looking for a knife to slit her throat because she was bored.  Hospitalized 4/19 - 5/2, treated for pneumonia/COPD exacerbation,  psychiatry rescinded IVC and patient discharged home. Seen by PCP 5/15 for dementia with behavioral disturbance and referral to geriatric medicine was made. Palliative care consulted to discuss goals of care with family.  If she continues to have poor oral intake, at risk for further decline and even death.  I have updated both patient's daughter and granddaughter who is the healthcare power of attorney with the same.  I do not believe that alternative means of nutrition i.e. tube feeding would be in patient's best interest.  Dementia with behavioral abnormalities: Was on Zoloft daily and trazodone at bedtime as needed. Started here on Depakote 250 Mg twice daily and Seroquel 25 Mg at bedtime. Check B12 (high/1947), B1 (pending), RPR (nonreactive). TSH mildly elevated but also free T4 up.  Ammonia normal. Ongoing behavioral issues as noted above.  Depakote changed to IV.  Patient continues to refuse meds and only taking them intermittently.  Refused Remeron that was initiated yesterday.  Trazodone as needed discontinued. Continue IV thiamine pending levels. Has tele sitter for safety but will likely have to be off of it for 24 hours if going to SNF. Psychiatry has been reconsulted and input pending.  Hypokalemia Replaced.  Potassium 4.7 and magnesium 2 on 5/30.  Suicidal ideation Patient came in IVC. Psychiatry consulted 5/25, did not feel that she was suicidal and signed off.  Paroxysmal atrial fibrillation EKG showed A-fib with heart rate of 47. TSH 5.399. TTE showed LVEF 65 to 70% and grade 1 diastolic dysfunction. CHA2DS2-VASc score 5 Cardiology consulted, patient had brief episode of A-fib and now back in normal sinus rhythm.  No beta-blockers indicated. Agree with cardiology  that patient is not a candidate for anticoagulation due to advanced dementia, noncompliance. As per cardiology, no need to follow-up with them.  Troponin elevation Mild, not consistent with ACS. No  angina. TTE showed normal EF. Suspected due to brief A-fib  Hypertension: Controlled.  Continue amlodipine if patient takes  Hyperlipidemia: Continue statins if patient takes.  Hypothyroidism: Continue Synthroid. TSH 5.4 and free T4 mildly elevated. Consider repeating TSH in 4 to 6 weeks after consistent compliance with meds.  Prolonged QTc Replaced K and magnesium as noted above. EKG 5/31 with QTc 468 ms down from 498 on 5/26. Minimize QTc prolonging medications as much as able.  COPD: No clinical bronchospasm.  Body mass index is 33.51 kg/m.     DVT prophylaxis: enoxaparin (LOVENOX) injection 40 mg Start: 12/11/21 1000     Code Status: Full Code:  Family Communication: None at bedside. Disposition:  Status is: Inpatient Ongoing refusal to p.o. intake, meds, adjusting meds, awaiting psychiatry follow-up.     Consultants:   Cardiology Psychiatry Palliative care  Procedures:   None  Antimicrobials:   None at this time   Subjective:  Evaluated patient along with her female RN and nurse tech at bedside.  As per nursing, ate half of sandwich brought in by family yesterday but ongoing refusal of meds and food.  Did not take Remeron last night.  Did take 4 or 5 of her oral meds this morning.  Refusing breakfast that was in front of her.  Ongoing combativeness, screaming loudly to questions.  Objective:   Vitals:   12/16/21 2235 12/17/21 1926 12/18/21 0422 12/18/21 1343  BP: 105/65 129/71 121/75 118/62  Pulse: 62 63 61 68  Resp:  18 20 16   Temp: 97.7 F (36.5 C) 98.7 F (37.1 C) 98 F (36.7 C) 98.2 F (36.8 C)  TempSrc: Oral Oral Oral Oral  SpO2:  93% 97% 99%  Weight:      Height:        General exam: Elderly female, moderately built and nourished, lying comfortably supine in bed without distress.  Still do not feel comfortable or safe examining her due to concern for physical harm. Central nervous system: Alert and only oriented to self. No focal  neurological deficits. Extremities: Symmetric 5 x 5 power. Psychiatry: Judgement and insight impaired. Mood & affect combative and verbally abusive.     Data Reviewed:   I have personally reviewed following labs and imaging studies   CBC: Recent Labs  Lab 12/16/21 1401  WBC 10.0  HGB 14.8  HCT 43.2  MCV 85.4  PLT 99991111    Basic Metabolic Panel: Recent Labs  Lab 12/11/21 1841 12/13/21 0610 12/14/21 0535 12/17/21 0927 12/18/21 0529  NA 141 143 141 137  --   K 3.1* 3.3* 3.6 4.7  --   CL 115* 115* 109 107  --   CO2 24 22 24 22   --   GLUCOSE 121* 120* 83 84  --   BUN 14 <5* <5* 16  --   CREATININE 0.68 0.59 0.50 0.71 0.63  CALCIUM 8.0* 8.4* 8.7* 8.4*  --   MG  --   --   --  2.0  --     Liver Function Tests: Recent Labs  Lab 12/17/21 0927  AST 32  ALT 19  ALKPHOS 54  BILITOT 1.4*  PROT 6.5  ALBUMIN 2.8*    CBG: No results for input(s): GLUCAP in the last 168 hours.  Microbiology Studies:   Recent Results (  from the past 240 hour(s))  Urine Culture     Status: None   Collection Time: 12/12/21  6:23 PM   Specimen: Urine, Clean Catch  Result Value Ref Range Status   Specimen Description   Final    URINE, CLEAN CATCH Performed at [facility], [place] [address], [place], [place] [address]    Special Requests   Final    NONE Performed at [facility], [place] [address], [place], [place] [address]    Culture   Final    NO GROWTH Performed at [facility], [place] [address], [place], [place] [address]    Report Status 12/13/2021 FINAL  Final    Radiology Studies:  No results found.  Scheduled Meds:    amLODipine  10 mg Oral Daily   enoxaparin (LOVENOX) injection  40 mg Subcutaneous Q24H   levothyroxine  50 mcg Oral q AM   mirtazapine  7.5 mg Oral QHS   mometasone-formoterol  2 puff Inhalation BID   montelukast  10 mg Oral QHS   pantoprazole  40 mg Oral Daily   QUEtiapine  25 mg Oral QHS   rosuvastatin  5 mg  Oral Daily   sertraline  100 mg Oral q morning   sodium chloride flush  3 mL Intravenous Q12H   thiamine injection  100 mg Intravenous Daily    Continuous Infusions:    valproate sodium 250 mg (12/18/21 1353)     LOS: 6 days     Vernell Leep, MD,  FACP, [facility], [facility], [facility] (Care Management Physician Certified) [place]  To contact the attending provider between 7A-7P or the covering provider during after hours 7P-7A, please log into the web site [email or web address] and access using universal [place] password for that web site. If you do not have the password, please call the hospital operator.  12/18/2021, 2:03 PM

## 2021-12-18 NOTE — Consult Note (Signed)
Jackie Garcia is a pleasant 81 y.o. female with medical history significant for COPD, depression, anxiety, dementia, and hypothyroidism who was sent to the emergency department under IVC that was taken out by family after she threatened to kill herself with a knife.  Patient's family reports patient has not been eating, drinking, refusing medication and neglecting herself.  This is also consistent with current hospital admission as patient has been refusing medication, labs, ADLs, in addition to therapy.  Psychiatry will be consulted for dementia with ongoing behavioral abnormalities.  Calmly assist with management.   patient admitted with worsening cognitive impairment, self-neglect, refusing medical care, and altered mental status.   Patient presented under IVC by granddaughter, for refusal to eat, not let anyone help with hygiene, and diagnosed with dementia attempted to grab a knife to kill self.  Patient denies any previous psychiatric diagnosis, suicide attempts, and or inpatient admission.  On evaluation he also denies current suicidal ideation, suicidal thoughts, and or suicidal gestures.  He is able to contract for safety.    Patient seen and attempted to assess however patient refused to engage with this Clinical research associate. Patient just stared at me, when asking if she could hear me she answered "yes" . Writer again introduced self and reason for visit, and patient just continued to stare and look at the TV. She did tell me that she was hungry and has not ate all day. This Clinical research associate offered her pudding and applesauce until her lunch arrived, however she just shook her no. This writer attempted to see patient around 145pm, well after lunch time. Not sure if she ate as her lunch tray was no longer in the room. Writer terminated interview after patient refused to engage and or participate.    SHe is fairly groomed and appears to have been bathed. Her mood is irritable and her affect is congruent and  inappropriate.  Per chart review she continues to have restlessness,agitation, aggression(Hitting, biting) at times, however is improved with hands on care, ambulation and talking to the patient. Patient may benefit from skilled nursing rehab or assisted living facility.   It is felt that patient may need higher level of care due to worsening memory impairment, confusion, behavioral disturbances, and restlessness.  Family seems to be concerned about her self-neglect, increased confusion, worsening personality changes, behavioral disturbances, and difficulty managing in the home.  They are requesting assistance with out-of-home placement.    Patient behaviors are consistent with a person with dementia and other major neurocognitive disorder.  Memory care unit would be appropriate as they are well equipped to help elderly persons with memory loss, maintain cognitive skills and help with quality of life. Patient is able to take care of self, however needs more social interaction and person centered care to help with worsening memory impairment and difficult dementia behaviors.  Patient has recently trialed home health services, however refuses care with them as well.  -Dementia with behavioral disturbances: Recent CT scan obtained on April 13, shows progressive cerebral atrophy. Continue telemetry sitter. -Recommend higher level of care  Due to patient's refusal of oral medication, will order Haldol 1 mg IV twice daily as needed for agitation, aggression, and psychosis. EKG obtained shows improvement in qtc (468) obtained today 5/31.  Patient will require ongoing cardiac monitoring, high risk for QTc prolongation.  Patient refusing telemetry, will continue to monitor closely.   -Continued Depacon 250 mg every 12 hours, will obtain repeat valproic acid level when appropriate.  -Will discontinue quetiapine  25 mg p.o. nightly, as patient has refused. -She also refused Remeron, however to code Zoloft this  morning. - [place] B12 greatly elevated at 1900+, Thiamine levels pending.   Psychiatry will continue to follow.

## 2021-12-19 DIAGNOSIS — Z515 Encounter for palliative care: Secondary | ICD-10-CM | POA: Diagnosis not present

## 2021-12-19 DIAGNOSIS — F03918 Unspecified dementia, unspecified severity, with other behavioral disturbance: Secondary | ICD-10-CM | POA: Diagnosis not present

## 2021-12-19 DIAGNOSIS — Z7189 Other specified counseling: Secondary | ICD-10-CM | POA: Diagnosis not present

## 2021-12-19 DIAGNOSIS — E876 Hypokalemia: Secondary | ICD-10-CM | POA: Diagnosis not present

## 2021-12-19 LAB — VITAMIN B1: Vitamin B1 (Thiamine): 150.7 nmol/L (ref 66.5–200.0)

## 2021-12-19 NOTE — Consult Note (Signed)
[facility]   Reason for Consult:  Referring Physician: Dr. Clarita LeberHongaglgi Patient Identification: Jackie Garcia MRN:  [id number] Principal Diagnosis: Hypokalemia Diagnosis:  Principal Problem:   Hypokalemia Active Problems:   COPD (chronic obstructive pulmonary disease) (HCC)   Hypothyroidism   Essential hypertension   Depression   Elevated troponin   Dehydration   Total Time spent with patient: 30 minutes  Subjective: "The food here is nasty.  They did not bring me any silverware.  The food is cold.  You need to hire no cooks.  I will not eat that food ever.  I am hungry and I am ill, the food is disgusting."  Jackie Garcia is a 81  year old female who [facility], under IVC by granddaughter.  Per IVC patient has history of dementia, refusing to eat, refusing to allow people to take care of her, suicidal ideations held a knife to her throat."   This provider saw the patient, chart was reviewed, and case discussed with psychiatry team and hospitalist.  Patient continues to be irritable yet grudgingly cooperative at baseline.  She is alert and oriented x3, however refuses to engage with most staff to include this provider.  The patient is very argumentative, and remains focused on being hungry and food.  Despite having food present in her room, patient refuses to eat.  This Clinical research associatewriter asked patient if she would like to warm up food, cut up her [employer] steak, and or set her up for lunch she declined all 3. The patient does not appear to be responding to internal or external stimuli.  The patient does not present with any delusional thinking.  Writer said good morning , in which patient replied "it is not morning anymore it is after 12 . "  The patient denies auditory or visual hallucinations. The patient denies any suicidal, homicidal, or self-harm ideations. The patient is not presenting with any psychotic or paranoid behaviors.  During an encounter with the patient, she could answer questions appropriately.  HPI: Jackie Garcia is a pleasant 81 y.o. female with medical history significant for COPD, depression, anxiety, dementia, and hypothyroidism who was sent to the emergency department under IVC that was taken out by family after she threatened to kill herself with a knife.  Patient's family reports patient has not been eating, drinking, refusing medication and neglecting herself.  This is also consistent with current hospital admission as patient has been refusing medication, labs, ADLs, in addition to therapy.  Psychiatry will be consulted for dementia with ongoing behavioral abnormalities.  Calmly assist with management  Past Psychiatric History: Dementia with behavioral disturbances, depression.  Currently is prescribed Zoloft, Depakote.  She is also seen by her primary care provider for outpatient psychiatric services.  Risk to Self: Denies Risk to Others: Denies Prior Inpatient Therapy: Denies Prior Outpatient Therapy: Denies, referral has been initiated to geriatric/neuropsychiatry  Past Medical History:  Past Medical History:  Diagnosis Date   Emphysema lung (HCC)    Family history of adverse reaction to anesthesia    Daughter - PONV   GERD (gastroesophageal reflux disease)    Hypothyroidism    Thyroid disease     Past Surgical History:  Procedure Laterality Date   MYRINGOTOMY WITH TUBE PLACEMENT Left 06/06/2021   Procedure: LEFT MYRINGOTOMY WITH BUTTERFLY TUBE PLACEMENT;  Surgeon: Vernie MurdersJuengel, Paul, MD;  [facility];  Service: ENT;  Laterality: Left;   TUBAL LIGATION  Family History:  Family History  Problem Relation Age of Onset   Lung cancer Father    Diabetes Mellitus II Sister    Family Psychiatric  History:  Social History:  Social History   Substance and Sexual Activity  Alcohol Use Never     Social History   Substance and Sexual Activity  Drug Use Never     Social History   Socioeconomic History   Marital status: Divorced    Spouse name: Not on file   Number of children: Not on file   Years of education: Not on file   Highest education level: Not on file  Occupational History   Not on file  Tobacco Use   Smoking status: Every Day    Packs/day: 0.10    Years: 40.00    Pack years: 4.00    Types: Cigarettes   Smokeless tobacco: Never   Tobacco comments:    Started smoking in her "40s".  Was 1 PPD.  Now down to 1-2 cigs/day.  Substance and Sexual Activity   Alcohol use: Never   Drug use: Never   Sexual activity: Not on file  Other Topics Concern   Not on file  Social History Narrative   Not on file   Social Determinants of Health   Financial Resource Strain: Not on file  Food Insecurity: Not on file  Transportation Needs: Not on file  Physical Activity: Not on file  Stress: Not on file  Social Connections: Not on file   Additional Social History:    Allergies:   Allergies  Allergen Reactions   Naproxen Swelling   Sulfa Antibiotics Rash   Codeine Itching    Labs:  Results for orders placed or performed during the hospital encounter of 12/10/21 (from the past 48 hour(s))  Ammonia     Status: None   Collection Time: 12/17/21  2:43 PM  Result Value Ref Range   Ammonia 33 9 - 35 umol/L    Comment: Performed at [facility], [address], [place], [place] [address]  Vitamin B12     Status: Abnormal   Collection Time: 12/17/21  2:43 PM  Result Value Ref Range   Vitamin B-12 1,947 (H) 180 - 914 pg/mL    Comment: RESULTS CONFIRMED BY MANUAL DILUTION (NOTE) This assay is not validated for testing neonatal or myeloproliferative syndrome specimens for Vitamin B12 levels. Performed at [facility], [address] [place], [place] [address]   Creatinine, serum     Status: None   Collection Time: 12/18/21  5:29 AM  Result Value Ref Range   Creatinine, Ser 0.63 0.44 - 1.00 mg/dL    GFR, Estimated >02 >63 mL/min    Comment: (NOTE) Calculated using the CKD-EPI Creatinine Equation (2021) Performed at [facility], [address], [place], [place] [address]     Current Facility-Administered Medications  Medication Dose Route Frequency Provider Last Rate Last Admin   acetaminophen (TYLENOL) tablet 650 mg  650 mg Oral Q6H PRN Opyd, Lavone Neri, MD   650 mg at 12/16/21 1629   Or   acetaminophen (TYLENOL) suppository 650 mg  650 mg Rectal Q6H PRN Opyd, Lavone Neri, MD       albuterol (PROVENTIL) (2.5 MG/3ML) 0.083% nebulizer solution 2.5 mg  2.5 mg Inhalation Q4H PRN Opyd, Lavone Neri, MD       amLODipine (NORVASC) tablet 10 mg  10 mg Oral Daily Opyd, Lavone Neri, MD   10 mg at  12/18/21 0931   enoxaparin (LOVENOX) injection 40 mg  40 mg Subcutaneous Q24H Opyd, Lavone Neri, MD   40 mg at 12/18/21 3888   haloperidol lactate (HALDOL) injection 1 mg  1 mg Intravenous Q6H PRN Maryagnes Amos, FNP       levothyroxine (SYNTHROID) tablet 50 mcg  50 mcg Oral q AM Opyd, Lavone Neri, MD   50 mcg at 12/19/21 0517   mirtazapine (REMERON SOL-TAB) disintegrating tablet 7.5 mg  7.5 mg Oral QHS Hongalgi, Anand D, MD   7.5 mg at 12/18/21 2203   mometasone-formoterol (DULERA) 200-5 MCG/ACT inhaler 2 puff  2 puff Inhalation BID Briscoe Deutscher, MD   2 puff at 12/14/21 2004   montelukast (SINGULAIR) tablet 10 mg  10 mg Oral QHS Opyd, Lavone Neri, MD   10 mg at 12/18/21 2203   ondansetron (ZOFRAN) tablet 4 mg  4 mg Oral Q6H PRN Opyd, Lavone Neri, MD       Or   ondansetron (ZOFRAN) injection 4 mg  4 mg Intravenous Q6H PRN Opyd, Lavone Neri, MD       pantoprazole (PROTONIX) EC tablet 40 mg  40 mg Oral Daily Opyd, Lavone Neri, MD   40 mg at 12/18/21 0931   rosuvastatin (CRESTOR) tablet 5 mg  5 mg Oral Daily Opyd, Lavone Neri, MD   5 mg at 12/18/21 0932   sertraline (ZOLOFT) tablet 100 mg  100 mg Oral q morning Opyd, Lavone Neri, MD   100 mg at 12/18/21 0931   sodium chloride flush (NS) 0.9 %  injection 3 mL  3 mL Intravenous Q12H Opyd, Lavone Neri, MD   3 mL at 12/18/21 2200   thiamine (B-1) injection 100 mg  100 mg Intravenous Daily Marcellus Scott D, MD   100 mg at 12/18/21 1351   valproate (DEPACON) 250 mg in dextrose 5 % 50 mL IVPB  250 mg Intravenous Q12H Elease Etienne, MD 52.5 mL/hr at 12/18/21 2205 250 mg at 12/18/21 2205    Musculoskeletal: Strength & Muscle Tone: within normal limits Gait & Station: unsteady Patient leans: N/A Psychiatric Specialty Exam:  Presentation  General Appearance: Casual; Appropriate for Environment  Eye Contact:Fair  Speech:Normal Rate; Clear and Coherent  Speech Volume:Normal  Handedness:Right   Mood and Affect  Mood:Irritable  Affect:Congruent   Thought Process  Thought Processes:Coherent  Descriptions of Associations:Intact  Orientation:Full (Time, Place and Person)  Thought Content:Logical  History of Schizophrenia/Schizoaffective disorder:No  Duration of Psychotic Symptoms:No data recorded Hallucinations:Hallucinations: None  Ideas of Reference:None  Suicidal Thoughts:Suicidal Thoughts: No  Homicidal Thoughts:Homicidal Thoughts: No   Sensorium  Memory:Immediate Fair; Recent Fair; Remote Fair  Judgment:Fair  Insight:Fair   Executive Functions  Concentration:Fair  Attention Span:Fair  Recall:Fair  Fund of Knowledge:Fair  Language:Fair   Psychomotor Activity  Psychomotor Activity:Psychomotor Activity: Normal   Assets  Assets:Communication Skills; Desire for Improvement; Housing; Physical Health; Resilience; Social Support   Sleep  Sleep:Sleep: Fair   Physical Exam: Physical Exam Vitals and nursing note reviewed.  Constitutional:      Appearance: Normal appearance.  HENT:     Head: Normocephalic and atraumatic.     Nose: Nose normal.  Cardiovascular:     Rate and Rhythm: Normal rate.     Pulses: Normal pulses.  Pulmonary:     Effort: Pulmonary effort is normal.   Musculoskeletal:        General: Normal range of motion.     Cervical back: Normal range of motion and neck supple.  Neurological:     General: No focal deficit present.     Mental Status: She is alert and oriented to person, place, and time. Mental status is at baseline.  Psychiatric:        Attention and Perception: Attention and perception normal.        Mood and Affect: Mood is anxious and depressed. Affect is blunt and inappropriate. Affect is not angry.        Speech: Speech is not rapid and pressured.        Behavior: Behavior is cooperative.        Thought Content: Thought content normal.        Cognition and Memory: Cognition and memory normal.        Judgment: Judgment is not impulsive or inappropriate.   Review of Systems  Psychiatric/Behavioral:  Positive for depression. The patient is nervous/anxious and has insomnia.   All other systems reviewed and are negative. Blood pressure 122/67, pulse 61, temperature 97.6 F (36.4 C), temperature source Oral, resp. rate 16, height  (1.575 m), weight 83.1 kg, SpO2 90 %. Body mass index is 33.51 kg/m.  Treatment Plan Summary: Plan Patient does meet criteria for geriatric-psychiatric inpatient admission  -Patient has made no attempts to leave, and or get out of the bed.  Patient is no longer an imminent danger to herself, may discontinue telemetry sitter. --Continued Depacon 250 mg every 12 hours, will obtain repeat valproic acid level when appropriate.  -Continue Haldol 1 mg IV every 6 hours as needed for agitation, aggression, and psychosis. -She has refused her morning meds, however took her medication last night.  Encourage nursing staff to continue attempts to administer medication throughout the day, as patient may change her mind. - [place] B12 greatly elevated at 1900+, Thiamine levels pending.  -Continue working closely with TOC for out of home placement.   -Psychiatry will continue to follow from a  distance. Disposition: No evidence of imminent risk to self or others at present.   Patient does not meet criteria for psychiatric inpatient admission. Supportive therapy provided about ongoing stressors.  Maryagnes Amos, FNP 12/19/2021 1:14 PM

## 2021-12-19 NOTE — Progress Notes (Signed)
PROGRESS NOTE   Jackie Garcia  [id number]    DOB: 02/10/1941    DOA: 12/10/2021  PCP: Pcp, No   I have briefly reviewed patients previous medical records in [facility].  Chief Complaint  Patient presents with   Diarrhea    Brief Narrative:  81 year old female, lives with her granddaughter/healthcare power of attorney PTA, medical history significant for COPD, depression, anxiety, dementia, hypothyroidism was sent to the ED under IVC after she threatened to kill herself with a knife.  As per family's report, has not been eating or drinking, refusing meds, swelling herself with feces, general failure to thrive for the last 1 to 2 months.  She was admitted with hypokalemia (potassium 2.7), suicidal ideation and A-fib.  Cardiology and psychiatry consulted and signed off.  Hospital course complicated by ongoing refusal of oral intake, meds, behavioral issues i.e. hitting/biting/scratching at nursing staff.  Palliative care consulted for [place] discussions.  TOC on board for SNF but no bed offers thus far.  Psychiatry reconsulted to assist with behavioral health issues.   Assessment & Plan:  Principal Problem:   Hypokalemia Active Problems:   COPD (chronic obstructive pulmonary disease) (HCC)   Hypothyroidism   Depression   Essential hypertension   Elevated troponin   Dehydration   Adult failure to thrive: Secondary to progressive/advanced dementia with behavioral abnormalities complicating advanced age. Although granddaughter indicated to palliative care MD that her mental status changes were acute since Saturday prior to admission, these appear to be longer of at least 2 months duration. Seen by psychiatry on 11/06/2021 when she presented to [facility] ED similar to current presentation, via law enforcement and under IVC.  At that time as well she was looking for a knife to slit her throat because she was bored.  Hospitalized 4/19 - 5/2, treated for pneumonia/COPD exacerbation,  psychiatry rescinded IVC and patient discharged home. Seen by PCP 5/15 for dementia with behavioral disturbance and referral to geriatric medicine was made. Palliative care consulted to discuss goals of care with family.  If she continues to have poor oral intake, at risk for further decline and even death.  I have updated both patient's daughter and granddaughter who is the healthcare power of attorney with the same.  I do not believe that alternative means of nutrition i.e. tube feeding would be in patient's best interest.  Dementia with behavioral abnormalities: Was on Zoloft daily and trazodone at bedtime as needed. Started here on Depakote 250 Mg twice daily and Seroquel 25 Mg at bedtime (stopped). Check B12 (high/1947), B1 (pending), RPR (nonreactive). TSH mildly elevated but also free T4 up.  Ammonia normal. Ongoing behavioral issues as noted above.  Depakote changed to IV, but reportedly refused this morning.  Patient continues to refuse meds and only taking them intermittently.  Took Remeron for the first time last night.  Trazodone as needed discontinued. Continue IV thiamine pending levels. Has tele sitter for safety but will likely have to be off of it for 24 hours if going to SNF. Psychiatry follow-up appreciated. Overall slight improvement in her mentation as an less loud and angry appearing, answering a few more questions but ongoing issues with refusal of care, argumentative, paranoia. Will check CBC, CMP and Depakote levels in AM.  Hypokalemia Replaced.  Potassium 4.7 and magnesium 2 on 5/30.  Suicidal ideation Patient came in IVC. Psychiatry consulted 5/25, did not feel that she was suicidal and signed off.  Paroxysmal atrial fibrillation EKG showed A-fib with heart rate  of 47. TSH 5.399. TTE showed LVEF 65 to 70% and grade 1 diastolic dysfunction. CHA2DS2-VASc score 5 Cardiology consulted, patient had brief episode of A-fib and now back in normal sinus rhythm.  No  beta-blockers indicated. Agree with cardiology that patient is not a candidate for anticoagulation due to advanced dementia, noncompliance. As per cardiology, no need to follow-up with them.  Troponin elevation Mild, not consistent with ACS. No angina. TTE showed normal EF. Suspected due to brief A-fib  Hypertension: Controlled.  Continue amlodipine if patient takes  Hyperlipidemia: Continue statins if patient takes.  Hypothyroidism: Continue Synthroid. TSH 5.4 and free T4 mildly elevated. Consider repeating TSH in 4 to 6 weeks after consistent compliance with meds.  Prolonged QTc Replaced K and magnesium as noted above. EKG 5/31 with QTc 468 ms down from 498 on 5/26. Minimize QTc prolonging medications as much as able.  COPD: No clinical bronchospasm.  Body mass index is 33.51 kg/m.     DVT prophylaxis: enoxaparin (LOVENOX) injection 40 mg Start: 12/11/21 1000     Code Status: Full Code:  Family Communication: None at bedside. Disposition:  Status is: Inpatient Ongoing refusal to p.o. intake, meds, adjusting meds, awaiting psychiatry follow-up.     Consultants:   Cardiology Psychiatry Palliative care  Procedures:   None  Antimicrobials:   None at this time   Subjective:  Seen late this morning.  Breakfast tray at bedside.  Wished her a good morning but patient kept staring at the TV without saying anything.  I asked her if she ate her breakfast, she said she was not eating it because it was cold and there was no utensils with it.  Nursing tech however reported that even when she warmed the food, she refused to eat it.  Did eat half of a sandwich yesterday.  Also reportedly refused meds this morning including IV Depakote.  Objective:   Vitals:   12/18/21 2001 12/19/21 0506 12/19/21 0800 12/19/21 1346  BP: 131/72 (!) 83/63 122/67 121/62  Pulse: 60 (!) 59 61 64  Resp: 16 16  14   Temp: 98.9 F (37.2 C) 97.6 F (36.4 C)  98.3 F (36.8 C)  TempSrc:  Oral Oral    SpO2: 98% 90%  100%  Weight:      Height:        General exam: Elderly female, moderately built and nourished, lying comfortably supine in bed without distress.  Still do not feel comfortable or safe examining her due to concern for physical harm.  Does not talk loudly or use profanity as much as she did over the last 2 days. Central nervous system: Alert and only oriented to self and place. No focal neurological deficits. Extremities: Symmetric 5 x 5 power. Psychiatry: Judgement and insight impaired. Mood & affect flat and does not want to engage and have to repeatedly ask her questions.     Data Reviewed:   I have personally reviewed following labs and imaging studies   CBC: Recent Labs  Lab 12/16/21 1401  WBC 10.0  HGB 14.8  HCT 43.2  MCV 85.4  PLT 99991111    Basic Metabolic Panel: Recent Labs  Lab 12/13/21 0610 12/14/21 0535 12/17/21 0927 12/18/21 0529  NA 143 141 137  --   K 3.3* 3.6 4.7  --   CL 115* 109 107  --   CO2 22 24 22   --   GLUCOSE 120* 83 84  --   BUN <5* <5* 16  --  CREATININE 0.59 0.50 0.71 0.63  CALCIUM 8.4* 8.7* 8.4*  --   MG  --   --  2.0  --     Liver Function Tests: Recent Labs  Lab 12/17/21 0927  AST 32  ALT 19  ALKPHOS 54  BILITOT 1.4*  PROT 6.5  ALBUMIN 2.8*    CBG: No results for input(s): GLUCAP in the last 168 hours.  Microbiology Studies:   Recent Results (from the past 240 hour(s))  Urine Culture     Status: None   Collection Time: 12/12/21  6:23 PM   Specimen: Urine, Clean Catch  Result Value Ref Range Status   Specimen Description   Final    URINE, CLEAN CATCH Performed at [facility], [place] [address], [place], [place] [address]    Special Requests   Final    NONE Performed at [facility], [place] [address], [place], [place] [address]    Culture   Final    NO GROWTH Performed at [facility], [place] [address], [place], [place] [address]    Report Status  12/13/2021 FINAL  Final    Radiology Studies:  No results found.  Scheduled Meds:    amLODipine  10 mg Oral Daily   enoxaparin (LOVENOX) injection  40 mg Subcutaneous Q24H   levothyroxine  50 mcg Oral q AM   mirtazapine  7.5 mg Oral QHS   mometasone-formoterol  2 puff Inhalation BID   montelukast  10 mg Oral QHS   pantoprazole  40 mg Oral Daily   rosuvastatin  5 mg Oral Daily   sertraline  100 mg Oral q morning   sodium chloride flush  3 mL Intravenous Q12H   thiamine injection  100 mg Intravenous Daily    Continuous Infusions:    valproate sodium 250 mg (12/18/21 2205)     LOS: 7 days     Vernell Leep, MD,  FACP, [employer], [facility], [facility] (Care Management Physician Certified) [place]  To contact the attending provider between 7A-7P or the covering provider during after hours 7P-7A, please log into the web site [email or web address] and access using universal [place] password for that web site. If you do not have the password, please call the hospital operator.  12/19/2021, 3:08 PM

## 2021-12-19 NOTE — Progress Notes (Signed)
Physical Therapy Treatment Patient Details Name: Jackie Garcia MRN: [id number] DOB: 03-10-1941 Today's Date: 12/19/2021   History of Present Illness 81 year old female with medical history of COPD, depression, anxiety, dementia, hypothyroidism was sent to the ED on 12/10/21 under IVC after she threatened to kill herself with a knife. Dx of hypokalemia.    PT Comments    General Comments: AxO x 2 able to express needs.  Requires MAX encouragement.  Very fearful of falling.  Can get agitated and refuse care.  Assisted OOB to amb to bathroom.  General bed mobility comments: pt self able to get OOB and back into bed  General transfer comment: pt self able to perform OOB and toilet transfer as well as self peri care and donning/doffing her adult briefs.  General Gait Details: pt quick to say "I can't walk" and "I will fall" and declined to amb in hallway.  Pt did agree to walk to bathroom and was able to self perfom using walker but appears to be more of a "furniture" walker as she pushed walker to side to steady herself on bed rail.  Pt present with poor forward flex posture but good balance.  She was able to donn/doff her briefs while standing and holding to grab bar in bathroom. Assisted back to bed per pt request.  Limited activity tolerance due to pt's behavior. Pt lives home alone and will need ST Rehab at SNF to address her decline in mobility and functional Indep.   Recommendations for follow up therapy are one component of a multi-disciplinary discharge planning process, led by the attending physician.  Recommendations may be updated based on patient status, additional functional criteria and insurance authorization.  Follow Up Recommendations  Skilled nursing-short term rehab (<3 hours/day)     Assistance Recommended at Discharge Intermittent Supervision/Assistance  Patient can return home with the following A little help with bathing/dressing/bathroom;Help with stairs or ramp for  entrance;Assistance with cooking/housework;Assist for transportation;A little help with walking and/or transfers   Equipment Recommendations  Rolling walker (2 wheels)    Recommendations for Other Services       Precautions / Restrictions Precautions Precautions: Fall Restrictions Weight Bearing Restrictions: No     Mobility  Bed Mobility Overal bed mobility: Modified Independent             General bed mobility comments: pt self able to get OOB and back into bed    Transfers Overall transfer level: Needs assistance Equipment used: Rolling walker (2 wheels), None Transfers: Sit to/from Stand, Bed to chair/wheelchair/BSC Sit to Stand: Supervision Stand pivot transfers: Supervision         General transfer comment: pt self able to perform OOB and toilet transfer as well as self peri care and donning/doffing her adult briefs.    Ambulation/Gait Ambulation/Gait assistance: Supervision Gait Distance (Feet): 22 Feet (11 feet x 2 to and from bathroom) Assistive device: Rolling walker (2 wheels), None Gait Pattern/deviations: Step-through pattern, Decreased stride length, Trunk flexed Gait velocity: decreased     General Gait Details: pt quick to say "I can't walk" and "I will fall" and declined to amb in hallway.  Pt did agree to walk to bathroom and was able to self perfom using walker but appears to be more of a "furniture" walker as she pushed walker to side to steady herself on bed rail.  Pt present with poor forward flex posture but good balance.  She was able to donn/doff her briefs while standing and holding to grab  bar in bathroom.   Stairs             Wheelchair Mobility    Modified Rankin (Stroke Patients Only)       Balance                                            Cognition Arousal/Alertness: Awake/alert Behavior During Therapy: Agitated Overall Cognitive Status: No family/caregiver present to determine baseline  cognitive functioning                                 General Comments: AxO x 2 able to express needs.  Requires MAX encouragement.  Very fearful of falling.  Can get agitated and refuse care.        Exercises      General Comments        Pertinent Vitals/Pain Pain Assessment Pain Assessment: No/denies pain    Home Living                          Prior Function            PT Goals (current goals can now be found in the care plan section) Progress towards PT goals: Progressing toward goals    Frequency    Min 2X/week      PT Plan Current plan remains appropriate    Co-evaluation              AM-PAC PT "6 Clicks" Mobility   Outcome Measure  Help needed turning from your back to your side while in a flat bed without using bedrails?: None Help needed moving from lying on your back to sitting on the side of a flat bed without using bedrails?: None Help needed moving to and from a bed to a chair (including a wheelchair)?: None Help needed standing up from a chair using your arms (e.g., wheelchair or bedside chair)?: None Help needed to walk in hospital room?: None Help needed climbing 3-5 steps with a railing? : A Little 6 Click Score: 23    End of Session Equipment Utilized During Treatment: Gait belt Activity Tolerance: Patient limited by fatigue;Other (comment) (pt self limiting) Patient left: in bed;with bed alarm set;with call bell/phone within reach;with nursing/sitter in room;Other (comment) (floor mats) Nurse Communication: Mobility status PT Visit Diagnosis: Muscle weakness (generalized) (M62.81)     Time: [id number] PT Time Calculation (min) (ACUTE ONLY): 29 min  Charges:  $Gait Training: 8-22 mins $Therapeutic Activity: 8-22 mins                    Rica Koyanagi  PTA Acute  Rehabilitation Services Pager      [phone] Office      [phone]

## 2021-12-19 NOTE — TOC Progression Note (Signed)
Transition of Care [facility]) - Progression Note    Patient Details  Name: Jackie Garcia MRN: [id number] Date of Birth: 06-Feb-1941  Transition of [facility]) CM/SW Contact  Otelia Santee, LCSW Phone Number: 12/19/2021, 2:32 PM  Clinical Narrative:     1327 CSW received call from [name] with [facility] who shared that they are able to accept this pt at their group home once FL-2 and negative PPD results are faxed over.   1340 CSW left HIPAA complian voicemail with pt's granddaughter, [name] [phone], to discuss discharge plans.   1435 Per Caryn Bee this patient is now recommended for inpatient gero-psych placement. CSW faxed referrals to the following facilities for review:  [facility]   [facility]   [facility]   [facility]   [facility]   [facility]   [facility]   [facility]   [facility]   [facility]   [facility]   [facility]   [facility]   [facility]   [facility]   [facility]     CSW will continue to seek appropriate placement for this patient.       Expected Discharge Plan:  (Undetermined) Barriers to Discharge: Family Issues, Unsafe home situation  Expected Discharge Plan and Services Expected Discharge Plan:  (Undetermined)                                               Social Determinants of Health (SDOH) Interventions    Readmission Risk Interventions     View : No data to display.

## 2021-12-20 DIAGNOSIS — E876 Hypokalemia: Secondary | ICD-10-CM | POA: Diagnosis not present

## 2021-12-20 LAB — COMPREHENSIVE METABOLIC PANEL
ALT: 18 U/L (ref 0–44)
AST: 31 U/L (ref 15–41)
Albumin: 2.9 g/dL — ABNORMAL LOW (ref 3.5–5.0)
Alkaline Phosphatase: 49 U/L (ref 38–126)
Anion gap: 9 (ref 5–15)
BUN: 15 mg/dL (ref 8–23)
CO2: 24 mmol/L (ref 22–32)
Calcium: 8.9 mg/dL (ref 8.9–10.3)
Chloride: 109 mmol/L (ref 98–111)
Creatinine, Ser: 0.56 mg/dL (ref 0.44–1.00)
GFR, Estimated: >60 mL/min (ref >60)
Glucose, Bld: 85 mg/dL (ref 70–99)
Potassium: 3.4 mmol/L — ABNORMAL LOW (ref 3.5–5.1)
Sodium: 142 mmol/L (ref 135–145)
Total Bilirubin: 0.6 mg/dL (ref 0.3–1.2)
Total Protein: 6.4 g/dL — ABNORMAL LOW (ref 6.5–8.1)

## 2021-12-20 LAB — CBC
HCT: 44.4 % (ref 36.0–46.0)
Hemoglobin: 14.4 g/dL (ref 12.0–15.0)
MCH: 28.3 pg (ref 26.0–34.0)
MCHC: 32.4 g/dL (ref 30.0–36.0)
MCV: 87.2 fL (ref 80.0–100.0)
Platelets: 256 10*3/uL (ref 150–400)
RBC: 5.09 MIL/uL (ref 3.87–5.11)
RDW: 14.6 % (ref 11.5–15.5)
WBC: 7.6 10*3/uL (ref 4.0–10.5)
nRBC: 0 % (ref 0.0–0.2)

## 2021-12-20 LAB — VALPROIC ACID LEVEL: Valproic Acid Lvl: 24 ug/mL — ABNORMAL LOW (ref 50.0–100.0)

## 2021-12-20 MED ORDER — DIVALPROEX SODIUM 125 MG PO CSDR
250.0000 mg | DELAYED_RELEASE_CAPSULE | Freq: Two times a day (BID) | ORAL | Status: DC
  Administered 2021-12-20 – 2021-12-25 (×9): 250 mg via ORAL
  Filled 2021-12-20 (×11): qty 2

## 2021-12-20 MED ORDER — POTASSIUM CHLORIDE CRYS ER 20 MEQ PO TBCR
30.0000 meq | EXTENDED_RELEASE_TABLET | ORAL | Status: AC
  Administered 2021-12-20: 30 meq via ORAL
  Filled 2021-12-20 (×2): qty 1

## 2021-12-20 NOTE — Progress Notes (Addendum)
PROGRESS NOTE   Jackie Garcia  [id number]    DOB: October 26, 1940    DOA: 12/10/2021  PCP: Pcp, No   I have briefly reviewed patients previous medical records in [facility].  Chief Complaint  Patient presents with   Diarrhea    Brief Narrative:  81 year old female, lives with her granddaughter/healthcare power of attorney PTA, medical history significant for COPD, depression, anxiety, dementia, hypothyroidism was sent to the ED under IVC after she threatened to kill herself with a knife.  As per family's report, has not been eating or drinking, refusing meds, swelling herself with feces, general failure to thrive for the last 1 to 2 months.  She was admitted with hypokalemia (potassium 2.7), suicidal ideation and A-fib.  Cardiology and psychiatry consulted and signed off.  Hospital course complicated by ongoing refusal of oral intake, meds, behavioral issues i.e. hitting/biting/scratching at nursing staff.  Palliative care consulted for [place] discussions.  TOC on board for SNF but no bed offers thus far.  Psychiatry reconsulted to assist with behavioral health issues.   Assessment & Plan:  Principal Problem:   Hypokalemia Active Problems:   COPD (chronic obstructive pulmonary disease) (HCC)   Hypothyroidism   Depression   Essential hypertension   Elevated troponin   Dehydration   Adult failure to thrive: Secondary to progressive/advanced dementia with behavioral abnormalities complicating advanced age. Although granddaughter indicated to palliative care MD that her mental status changes were acute since Saturday prior to admission, these appear to be longer of at least 2 months duration. Seen by psychiatry on 11/06/2021 when she presented to [facility] ED similar to current presentation, via law enforcement and under IVC.  At that time as well she was looking for a knife to slit her throat because she was bored.  Hospitalized 4/19 - 5/2, treated for pneumonia/COPD exacerbation,  psychiatry rescinded IVC and patient discharged home. Seen by PCP 5/15 for dementia with behavioral disturbance and referral to geriatric medicine was made. Palliative care consulted to discuss goals of care with family.  If she continues to have poor oral intake, at risk for further decline and even death.  I have updated both patient's daughter and granddaughter who is the healthcare power of attorney with the same.  I do not believe that alternative means of nutrition i.e. tube feeding would be in patient's best interest.  Dementia with behavioral abnormalities: B12 (high/1947), B1 (normal), RPR (nonreactive). TSH mildly elevated but also free T4 up.  Ammonia normal. Ongoing behavioral issues as noted above.  Depakote started this admission, was briefly changed to IV which she took for a couple of days and then even started refusing this.  Compared to 48 to 72 hours ago, mental status overall seems to be somewhat better, less combative, at least take some of her oral medications.  Changed IV Depakote to Depakote sprinkles.  Continue bedtime Remeron and daytime Zoloft.  Seroquel and trazodone have been discontinued this admission.  Check a.m. Depakote level prior to morning dose on 6/6 if still here or at nursing facility. Thiamine levels normal, discontinued IV thiamine. Has tele sitter for safety but will likely have to be off of it for 24 hours if going to SNF. Psychiatry follow-up appreciated and signed off 6/2. Depakote level low/24 on 6/2.  CBC CMP unremarkable except for hypokalemia.   Hypokalemia Replaced.  Follow periodically.  Suicidal ideation Patient came in IVC. Psychiatry consulted 5/25, did not feel that she was suicidal and signed off.  Paroxysmal atrial  fibrillation EKG showed A-fib with heart rate of 47. TSH 5.399. TTE showed LVEF 65 to 70% and grade 1 diastolic dysfunction. CHA2DS2-VASc score 5 Cardiology consulted, patient had brief episode of A-fib and now back in  normal sinus rhythm.  No beta-blockers indicated. Agree with cardiology that patient is not a candidate for anticoagulation due to advanced dementia, noncompliance. As per cardiology, no need to follow-up with them.  Troponin elevation Mild, not consistent with ACS. No angina. TTE showed normal EF. Suspected due to brief A-fib  Hypertension: Controlled.  Continue amlodipine if patient takes  Hyperlipidemia: Continue statins if patient takes.  Hypothyroidism: Continue Synthroid. TSH 5.4 and free T4 mildly elevated. Consider repeating TSH in 4 to 6 weeks after consistent compliance with meds.  Prolonged QTc Replaced K and magnesium as noted above. EKG 5/31 with QTc 468 ms down from 498 on 5/26. Minimize QTc prolonging medications as much as able.  COPD: No clinical bronchospasm.  Body mass index is 33.51 kg/m.     DVT prophylaxis: enoxaparin (LOVENOX) injection 40 mg Start: 12/11/21 1000     Code Status: Full Code:  Family Communication: Unable to reach granddaughter, left VM message. Disposition:  Status is: Inpatient Ongoing refusal to p.o. intake, meds, adjusting meds, awaiting psychiatry follow-up. Patient is medically optimized for DC to next level of care.  TOC exploring options.  Was hopeful for a group home but that appears to have fallen through.  She is exploring alternate options.   Consultants:   Cardiology Psychiatry Palliative care  Procedures:   None  Antimicrobials:   None at this time   Subjective:  Patient noted to be sleeping soundly midmorning.  Did not wake her up.  As per nursing, ongoing issues with inconsistent oral intake and medication intake but not agitated or overtly combative.  Objective:   Vitals:   12/19/21 2110 12/20/21 0652 12/20/21 1230 12/20/21 1236  BP: 131/75 113/74  121/61  Pulse: 63 64 64 67  Resp: 20 18    Temp: 98.7 F (37.1 C) 98 F (36.7 C) 98.2 F (36.8 C) 98 F (36.7 C)  TempSrc: Oral Axillary Oral    SpO2: 90% 92% 91% 94%  Weight:      Height:       Patient sleeping and did not wake her up today. General exam: Elderly female, moderately built and nourished, lying comfortably supine in bed without distress.  Still do not feel comfortable or safe examining her due to concern for physical harm.  Does not talk loudly or use profanity as much as she did over the last 2 days. Central nervous system: Alert and only oriented to self and place. No focal neurological deficits. Extremities: Symmetric 5 x 5 power. Psychiatry: Judgement and insight impaired. Mood & affect flat and does not want to engage and have to repeatedly ask her questions.     Data Reviewed:   I have personally reviewed following labs and imaging studies   CBC: Recent Labs  Lab 12/16/21 1401 12/20/21 0457  WBC 10.0 7.6  HGB 14.8 14.4  HCT 43.2 44.4  MCV 85.4 87.2  PLT 218 [id number]    Basic Metabolic Panel: Recent Labs  Lab 12/14/21 0535 12/17/21 0927 12/18/21 0529 12/20/21 0457  NA 141 137  --  142  K 3.6 4.7  --  3.4*  CL 109 107  --  109  CO2 24 22  --  24  GLUCOSE 83 84  --  85  BUN <5* 16  --  15  CREATININE 0.50 0.71 0.63 0.56  CALCIUM 8.7* 8.4*  --  8.9  MG  --  2.0  --   --     Liver Function Tests: Recent Labs  Lab 12/17/21 0927 12/20/21 0457  AST 32 31  ALT 19 18  ALKPHOS 54 49  BILITOT 1.4* 0.6  PROT 6.5 6.4*  ALBUMIN 2.8* 2.9*    CBG: No results for input(s): GLUCAP in the last 168 hours.  Microbiology Studies:   Recent Results (from the past 240 hour(s))  Urine Culture     Status: None   Collection Time: 12/12/21  6:23 PM   Specimen: Urine, Clean Catch  Result Value Ref Range Status   Specimen Description   Final    URINE, CLEAN CATCH Performed at [facility], [place] [address] [place], [place] [address]    Special Requests   Final    NONE Performed at [facility], [place] [address], [place], [place] [address]    Culture   Final    NO  GROWTH Performed at [facility], [place] [address], [place],  [address]    Report Status 12/13/2021 FINAL  Final    Radiology Studies:  No results found.  Scheduled Meds:    amLODipine  10 mg Oral Daily   divalproex  250 mg Oral Q12H   enoxaparin (LOVENOX) injection  40 mg Subcutaneous Q24H   levothyroxine  50 mcg Oral q AM   mirtazapine  7.5 mg Oral QHS   mometasone-formoterol  2 puff Inhalation BID   montelukast  10 mg Oral QHS   pantoprazole  40 mg Oral Daily   rosuvastatin  5 mg Oral Daily   sertraline  100 mg Oral q morning   sodium chloride flush  3 mL Intravenous Q12H    Continuous Infusions:       LOS: 8 days     Vernell Leep, MD,  FACP, [facility], [facility], [facility] (Care Management Physician Certified) [place]  To contact the attending provider between 7A-7P or the covering provider during after hours 7P-7A, please log into the web site [email or web address] and access using universal [place] password for that web site. If you do not have the password, please call the hospital operator.  12/20/2021, 5:06 PM

## 2021-12-20 NOTE — Consult Note (Addendum)
[facility] Face-to-Face Psychiatry Consult   Reason for Consult:  Referring Physician: Dr. Clarita Leber Patient Identification: Jackie Garcia MRN:  [id number] Principal Diagnosis: Hypokalemia Diagnosis:  Principal Problem:   Hypokalemia Active Problems:   COPD (chronic obstructive pulmonary disease) (HCC)   Hypothyroidism   Essential hypertension   Depression   Elevated troponin   Dehydration   Total Time spent with patient: 30 minutes  Subjective:    This provider saw the patient, chart was reviewed, and case discussed with nursing.  Upon entering the room, patient was observed to be taken her morning medication.  She successfully took her Depakote, and refused other medication.  Writer attempted to engage with patient, however she refused to participate.   Writer advised patient that upon entering the room she was speaking with the nurse, however patient refused to participate.  Writer made multiple attempts to speak and engage with patient, in which she continued to ignore all requests.    Patient continues to display selective mutism with certain staff.  At baseline patient is irritable, and seems to be cooperating with nursing staff as she is noted to have taken her medication.  Additional remarks were obtained from nursing, as patient would not speak to this Clinical research associate.  Patient has been compliant with her psychotropic medication, chart was reviewed confirms compliance with mirtazapine and Depakote. Valproic acid level obtained today of 24.  Patient has not been disruptive in the past 48 hours.  Patient has not required any additional as needed medication, chemical restraint, and or physical restraint.  Telemetry sitter was discontinued as she does not appear to be a danger to herself and or others at this time.  Nursing staff denies any new mental health concerns or behavior issues.  HPI: Jackie Garcia is a pleasant 81 y.o. female with medical history significant for COPD, depression,  anxiety, dementia, and hypothyroidism who was sent to the emergency department under IVC that was taken out by family after she threatened to kill herself with a knife.  Patient's family reports patient has not been eating, drinking, refusing medication and neglecting herself.  This is also consistent with current hospital admission as patient has been refusing medication, labs, ADLs, in addition to therapy.  Psychiatry will be consulted for dementia with ongoing behavioral abnormalities.  Calmly assist with management  Past Psychiatric History: Dementia with behavioral disturbances, depression.  Currently is prescribed Zoloft, Depakote.  She is also seen by her primary care provider for outpatient psychiatric services.  Risk to Self: Denies Risk to Others: Denies Prior Inpatient Therapy: Denies Prior Outpatient Therapy: Denies, referral has been initiated to geriatric/neuropsychiatry  Past Medical History:  Past Medical History:  Diagnosis Date   Emphysema lung (HCC)    Family history of adverse reaction to anesthesia    Daughter - PONV   GERD (gastroesophageal reflux disease)    Hypothyroidism    Thyroid disease     Past Surgical History:  Procedure Laterality Date   MYRINGOTOMY WITH TUBE PLACEMENT Left 06/06/2021   Procedure: LEFT MYRINGOTOMY WITH BUTTERFLY TUBE PLACEMENT;  Surgeon: Vernie Murders, MD;  Location: [facility];  Service: ENT;  Laterality: Left;   TUBAL LIGATION     Family History:  Family History  Problem Relation Age of Onset   Lung cancer Father    Diabetes Mellitus II Sister    Family Psychiatric  History:  Social History:  Social History   Substance and Sexual Activity  Alcohol Use Never     Social History  Substance and Sexual Activity  Drug Use Never    Social History   Socioeconomic History   Marital status: Divorced    Spouse name: Not on file   Number of children: Not on file   Years of education: Not on file   Highest education  level: Not on file  Occupational History   Not on file  Tobacco Use   Smoking status: Every Day    Packs/day: 0.10    Years: 40.00    Pack years: 4.00    Types: Cigarettes   Smokeless tobacco: Never   Tobacco comments:    Started smoking in her "43s".  Was 1 PPD.  Now down to 1-2 cigs/day.  Substance and Sexual Activity   Alcohol use: Never   Drug use: Never   Sexual activity: Not on file  Other Topics Concern   Not on file  Social History Narrative   Not on file   Social Determinants of Health   Financial Resource Strain: Not on file  Food Insecurity: Not on file  Transportation Needs: Not on file  Physical Activity: Not on file  Stress: Not on file  Social Connections: Not on file   Additional Social History:    Allergies:   Allergies  Allergen Reactions   Naproxen Swelling   Sulfa Antibiotics Rash   Codeine Itching    Labs:  Results for orders placed or performed during the hospital encounter of 12/10/21 (from the past 48 hour(s))  CBC     Status: None   Collection Time: 12/20/21  4:57 AM  Result Value Ref Range   WBC 7.6 4.0 - 10.5 K/uL   RBC 5.09 3.87 - 5.11 MIL/uL   Hemoglobin 14.4 12.0 - 15.0 g/dL   HCT 16.1 09.6 - 04.5 %   MCV 87.2 80.0 - 100.0 fL   MCH 28.3 26.0 - 34.0 pg   MCHC 32.4 30.0 - 36.0 g/dL   RDW 40.9 81.1 - 91.4 %   Platelets 256 150 - 400 K/uL   nRBC 0.0 0.0 - 0.2 %    Comment: Performed at [facility], [address], [place], [place] [address]  Comprehensive metabolic panel     Status: Abnormal   Collection Time: 12/20/21  4:57 AM  Result Value Ref Range   Sodium 142 135 - 145 mmol/L   Potassium 3.4 (L) 3.5 - 5.1 mmol/L   Chloride 109 98 - 111 mmol/L   CO2 24 22 - 32 mmol/L   Glucose, Bld 85 70 - 99 mg/dL    Comment: Glucose reference range applies only to samples taken after fasting for at least 8 hours.   BUN 15 8 - 23 mg/dL   Creatinine, Ser 6.21 0.44 - 1.00 mg/dL   Calcium 8.9 8.9 - 30.8 mg/dL   Total  Protein 6.4 (L) 6.5 - 8.1 g/dL   Albumin 2.9 (L) 3.5 - 5.0 g/dL   AST 31 15 - 41 U/L   ALT 18 0 - 44 U/L   Alkaline Phosphatase 49 38 - 126 U/L   Total Bilirubin 0.6 0.3 - 1.2 mg/dL   GFR, Estimated >65 >78 mL/min    Comment: (NOTE) Calculated using the CKD-EPI Creatinine Equation (2021)    Anion gap 9 5 - 15    Comment: Performed at [facility], [address], [place], [place] [address]  Valproic acid level     Status: Abnormal   Collection Time: 12/20/21  4:57 AM  Result Value  Ref Range   Valproic Acid Lvl 24 (L) 50.0 - 100.0 ug/mL    Comment: Performed at [facility], [address] [place], [place] [address]    Current Facility-Administered Medications  Medication Dose Route Frequency Provider Last Rate Last Admin   acetaminophen (TYLENOL) tablet 650 mg  650 mg Oral Q6H PRN Opyd, Lavone Neri, MD   650 mg at 12/16/21 1629   Or   acetaminophen (TYLENOL) suppository 650 mg  650 mg Rectal Q6H PRN Opyd, Lavone Neri, MD       albuterol (PROVENTIL) (2.5 MG/3ML) 0.083% nebulizer solution 2.5 mg  2.5 mg Inhalation Q4H PRN Opyd, Lavone Neri, MD       amLODipine (NORVASC) tablet 10 mg  10 mg Oral Daily Opyd, Lavone Neri, MD   10 mg at 12/18/21 0931   enoxaparin (LOVENOX) injection 40 mg  40 mg Subcutaneous Q24H Opyd, Lavone Neri, MD   40 mg at 12/18/21 8466   haloperidol lactate (HALDOL) injection 1 mg  1 mg Intravenous Q6H PRN Maryagnes Amos, FNP       levothyroxine (SYNTHROID) tablet 50 mcg  50 mcg Oral q AM Opyd, Lavone Neri, MD   50 mcg at 12/20/21 5993   mirtazapine (REMERON SOL-TAB) disintegrating tablet 7.5 mg  7.5 mg Oral QHS Hongalgi, Anand D, MD   7.5 mg at 12/19/21 2109   mometasone-formoterol (DULERA) 200-5 MCG/ACT inhaler 2 puff  2 puff Inhalation BID Opyd, Lavone Neri, MD   2 puff at 12/19/21 1948   montelukast (SINGULAIR) tablet 10 mg  10 mg Oral QHS Opyd, Lavone Neri, MD   10 mg at 12/19/21 2109   ondansetron (ZOFRAN) tablet 4 mg  4 mg Oral  Q6H PRN Opyd, Lavone Neri, MD       Or   ondansetron (ZOFRAN) injection 4 mg  4 mg Intravenous Q6H PRN Opyd, Lavone Neri, MD       pantoprazole (PROTONIX) EC tablet 40 mg  40 mg Oral Daily Opyd, Lavone Neri, MD   40 mg at 12/18/21 0931   potassium chloride (KLOR-CON M) CR tablet 30 mEq  30 mEq Oral Q4H Hongalgi, Theadora Rama D, MD   30 mEq at 12/20/21 5701   rosuvastatin (CRESTOR) tablet 5 mg  5 mg Oral Daily Opyd, Lavone Neri, MD   5 mg at 12/18/21 0932   sertraline (ZOLOFT) tablet 100 mg  100 mg Oral q morning Opyd, Lavone Neri, MD   100 mg at 12/18/21 0931   sodium chloride flush (NS) 0.9 % injection 3 mL  3 mL Intravenous Q12H Opyd, Lavone Neri, MD   3 mL at 12/18/21 2200   valproate (DEPACON) 250 mg in dextrose 5 % 50 mL IVPB  250 mg Intravenous Q12H Elease Etienne, MD 52.5 mL/hr at 12/18/21 2205 250 mg at 12/18/21 2205    Musculoskeletal: Strength & Muscle Tone: within normal limits Gait & Station: unsteady Patient leans: N/A Psychiatric Specialty Exam:  Presentation  General Appearance: Gaurded  Eye Contact:Fair  Speech:Normal Rate; Clear and Coherent  Speech Volume:Normal  Handedness:Right   Mood and Affect  Mood:Irritable  Affect:Congruent   Thought Process  Thought Processes:Coherent  Descriptions of Associations:Intact  Orientation:Full (Time, Place and Person)  Thought Content:Logical  History of Schizophrenia/Schizoaffective disorder:No  Duration of Psychotic Symptoms:No data recorded Hallucinations:Hallucinations: None  Ideas of Reference:None  Suicidal Thoughts:Suicidal Thoughts: No  Homicidal Thoughts:Homicidal Thoughts: No   Sensorium Chief of Staff Activity  Psychomotor Activity:Psychomotor  Activity: Normal  Sleep  Sleep:Sleep: Fair   Physical Exam: Physical Exam Vitals and nursing note reviewed.  Constitutional:      Appearance: Normal appearance.  HENT:     Head: Normocephalic and atraumatic.     Nose: Nose  normal.  Cardiovascular:     Rate and Rhythm: Normal rate.     Pulses: Normal pulses.  Pulmonary:     Effort: Pulmonary effort is normal.  Musculoskeletal:        General: Normal range of motion.     Cervical back: Normal range of motion and neck supple.  Neurological:     General: No focal deficit present.     Mental Status: She is alert and oriented to person, place, and time. Mental status is at baseline.  Psychiatric:        Attention and Perception: Attention and perception normal.        Mood and Affect: Mood is anxious. Mood is not depressed. Affect is labile, blunt and inappropriate. Affect is not angry.        Speech: Speech normal.        Behavior: Behavior is cooperative.        Thought Content: Thought content normal.        Cognition and Memory: Cognition and memory normal.        Judgment: Judgment normal. Judgment is not impulsive or inappropriate.   Review of Systems  Psychiatric/Behavioral:  Positive for depression. The patient is nervous/anxious and has insomnia.   All other systems reviewed and are negative. Blood pressure 113/74, pulse 64, temperature 98 F (36.7 C), temperature source Axillary, resp. rate 18, height 5\' 2"  (1.575 m), weight 83.1 kg, SpO2 92 %. Body mass index is 33.51 kg/m.  Treatment Plan Summary: Plan Patient does NOT meet criteria for geriatric-psychiatric inpatient admission  --Continued difficult 250 mg every 12 hours, will obtain repeat valproic acid on June 6 prior to morning dose administration.  Order will be placed -Continue Haldol 1 mg IV every 6 hours as needed for agitation, aggression, and psychosis. -She continues to refuse some of her morning medication, although she took her Depakote.   - Vitmain B12 greatly elevated at 1900+, Thiamine levels pending.  -Continue working closely with TOC for out of home placement.   -Psychiatry will sign off at this time, there has been limited participation and engagement, during repeat  attempts for psychiatric reassessment.  There has been some progression, as patient takes her medication, allows from lab ; however patient remains irritable.   Disposition: No evidence of imminent risk to self or others at present.   Patient does not meet criteria for psychiatric inpatient admission. Supportive therapy provided about ongoing stressors.  Maryagnes Amosakia S Starkes-Perry, FNP 12/20/2021 9:51 AM

## 2021-12-20 NOTE — Progress Notes (Signed)
Daily Progress Note   Patient Name: Jackie Garcia       Date: 12/20/2021 DOB: [birth year]  Age: 81 y.o. MRN#: [id number] Attending Physician: Elease Etienne, MD Primary Care Physician: Pcp, No Admit Date: 12/10/2021  Reason for Consultation/Follow-up: Establishing goals of care  Subjective: I saw and examined Jackie Garcia today.  She was awake and alert at time of my encounter.  Agitated and reports that she is not eating as food is not to her liking, "you need to hire new kitchen staff."  Discussed concerns about nutrition moving forward and she tells me that she will eat if it is not food prepared by the hospital.  States, "Hell no" when feeding tube mentioned.  Attempted to call granddaughter but did not reach her today.  Length of Stay: 8  Current Medications: Scheduled Meds:   amLODipine  10 mg Oral Daily   enoxaparin (LOVENOX) injection  40 mg Subcutaneous Q24H   levothyroxine  50 mcg Oral q AM   mirtazapine  7.5 mg Oral QHS   mometasone-formoterol  2 puff Inhalation BID   montelukast  10 mg Oral QHS   pantoprazole  40 mg Oral Daily   potassium chloride  30 mEq Oral Q4H   rosuvastatin  5 mg Oral Daily   sertraline  100 mg Oral q morning   sodium chloride flush  3 mL Intravenous Q12H    Continuous Infusions:  valproate sodium 250 mg (12/18/21 2205)    PRN Meds: acetaminophen **OR** acetaminophen, albuterol, haloperidol lactate, ondansetron **OR** ondansetron (ZOFRAN) IV  Physical Exam   Declined exam        Vital Signs: BP 113/74 (BP Location: Left Arm)   Pulse 64   Temp 98 F (36.7 C) (Axillary)   Resp 18   Ht 5\' 2"  (1.575 m)   Wt 83.1 kg   SpO2 92%   BMI 33.51 kg/m  SpO2: SpO2: 92 % O2 Device: O2 Device: Room Air O2 Flow Rate:    Intake/output  summary:  Intake/Output Summary (Last 24 hours) at 12/20/2021 0924 Last data filed at 12/20/2021 0840 Gross per 24 hour  Intake 300 ml  Output --  Net 300 ml    LBM: Last BM Date : 12/14/21 Baseline Weight: Weight: 83.1 kg Most recent weight: Weight: 83.1 kg  Palliative Assessment/Data:    Flowsheet Rows    Flowsheet Row Most Recent Value  Intake Tab   Referral Department Hospitalist  Unit at Time of Referral Med/Surg Unit  Palliative Care Primary Diagnosis Neurology  Date Notified 12/17/21  Palliative Care Type New Palliative care  Reason for referral Clarify Goals of Care  Date of Admission 12/10/21  Date first seen by Palliative Care 12/17/21  # of days Palliative referral response time 0 Day(s)  # of days IP prior to Palliative referral 7  Clinical Assessment   Palliative Performance Scale Score 40%  Psychosocial & Spiritual Assessment   Social Work Plan of Care Clarified patient/family wishes with healthcare team  Palliative Care Outcomes   Patient/Family meeting held? Yes  Who was at the meeting? Granddaughter       Patient Active Problem List   Diagnosis Date Noted   COPD (chronic obstructive pulmonary disease) (HCC) 10/07/2020   Depression 11/07/2021   Hypothyroidism 10/08/2020   Dehydration 12/12/2021   Hypokalemia 12/11/2021   Elevated troponin 12/11/2021   Essential hypertension 11/07/2021   Dyslipidemia 11/07/2021   Tobacco abuse 10/07/2020   Tobacco use disorder 10/07/2020    Palliative Care Assessment & Plan   Patient Profile: 81 y.o. female  with past medical history of COPD, depression, anxiety, dementia, hypothyroidism admitted on 12/10/2021 under IVC admission after threatening to kill herself with a knife.  Family reports beginning last Saturday that she had been refusing to eat or drink and had become incontinent and was spreading fecal material all over the house.  She was admitted with hypokalemia, A-fib, and concern for suicidal  ideation.  Cardiology and psychiatry have both evaluated and signed off.  Noted continued concern about intake with refusal of oral intake or medications.  Combative at times as well.  Palliative consulted for goals of care.  Recommendations/Plan: Full code/full scope At time of my encounter, she did not appear to have insight into her overall condition, however, this is certainly something that can change throughout course of the day.  Unable to reach granddaughter today. Discussed limitations of care with her granddaughter on 6/1.  She stated at that time that she did not think that feeding tube would be something patient would want/tolerate. Palliative to continue to follow.  Goals of Care and Additional Recommendations: Limitations on Scope of Treatment: Full Scope Treatment  Code Status:    Code Status Orders  (From admission, onward)           Start     Ordered   12/11/21 0612  Full code  Continuous        12/11/21 0614           Code Status History     Date Active Date Inactive Code Status Order ID Comments User Context   11/06/2021 2319 11/19/2021 1911 Full Code [id number]  Hannah Beat, MD ED   10/08/2020 0628 10/10/2020 0011 Full Code [id number]  Therisa Doyne, MD ED       Prognosis:  Unable to determine  Discharge Planning: To Be Determined  Care plan was discussed with patient, granddaughter  Thank you for allowing the Palliative Medicine Team to assist in the care of this patient.  Total time: 25 minutes  [name], MD  Please contact Palliative Medicine Team phone at [phone] for questions and concerns.

## 2021-12-20 NOTE — TOC Progression Note (Addendum)
Transition of Care [facility]) - Progression Note    Patient Details  Name: Jackie Garcia MRN: [id number] Date of Birth: 1941-01-07  Transition of [facility] CM/SW Contact  Otelia Santee, LCSW Phone Number: 12/20/2021, 11:00 AM  Clinical Narrative:    Spoke with pt's granddaughter, [name] who is agreeable to have this patient placed in group home setting at discharge. She is concerned with pt's financial situation and how this pt will be able to afford the group home as her finances are currently "in a mess" from when pt's daughter was caring for her. Pt's granddaughter was able to apply for Medicaid on this pt's behalf on 12/19/21. Group home may still be willing to accept with Medicaid pending.   CSW spoke with [place] at [facility]. She states that they accepted another individual to their facility for today however, they have not yet received this individuals FL-2 and report if they have not received it by 1400 that she will re-review this patient for acceptance at their facility. CSW will follow up with Paige at 1400 for determination.   1500: Spoke with [name] at [facility] who confirmed the bed that was available had been filled. She states if another bed comes available she will reach back out to CSW. CSW will continue to explore SNF placement for this patient and will continue to follow for discharge needs.   Expected Discharge Plan:  (Undetermined) Barriers to Discharge: Family Issues, Unsafe home situation  Expected Discharge Plan and Services Expected Discharge Plan:  (Undetermined)                                               Social Determinants of Health (SDOH) Interventions    Readmission Risk Interventions     View : No data to display.

## 2021-12-21 DIAGNOSIS — E876 Hypokalemia: Secondary | ICD-10-CM | POA: Diagnosis not present

## 2021-12-21 DIAGNOSIS — F03918 Unspecified dementia, unspecified severity, with other behavioral disturbance: Secondary | ICD-10-CM | POA: Diagnosis not present

## 2021-12-21 NOTE — Progress Notes (Signed)
PROGRESS NOTE   DAILY PROVINS  [id number]    DOB: [birth year]    DOA: 12/10/2021  PCP: Pcp, No   I have briefly reviewed patients previous medical records in [facility].  Chief Complaint  Patient presents with   Diarrhea    Brief Narrative:  81 year old female, lives with her granddaughter/healthcare power of attorney PTA, medical history significant for COPD, depression, anxiety, dementia, hypothyroidism was sent to the ED under IVC after she threatened to kill herself with a knife.  As per family's report, has not been eating or drinking, refusing meds, swelling herself with feces, general failure to thrive for the last 1 to 2 months.  She was admitted with hypokalemia (potassium 2.7), suicidal ideation and A-fib.  Cardiology and psychiatry consulted and signed off.  Hospital course complicated by ongoing refusal of oral intake, meds, behavioral issues i.e. hitting/biting/scratching at nursing staff.  Palliative care consulted for [place] discussions.  TOC on board for SNF but no bed offers thus far.  Psychiatry reconsulted to assist with behavioral health issues.  No new or acute issues.  Currently in a holding pattern and awaiting placement.  Ongoing chronic issues with poor and inconsistent oral intake and med intake.   Assessment & Plan:  Principal Problem:   Hypokalemia Active Problems:   COPD (chronic obstructive pulmonary disease) (HCC)   Hypothyroidism   Depression   Essential hypertension   Elevated troponin   Dehydration   Adult failure to thrive: Secondary to progressive/advanced dementia with behavioral abnormalities complicating advanced age. Although granddaughter indicated to palliative care MD that her mental status changes were acute since Saturday prior to admission, these appear to be longer of at least 2 months duration if not more. Seen by psychiatry on 11/06/2021 when she presented to [facility] ED similar to current presentation, via law enforcement and  under IVC.  At that time too, she was looking for a knife to slit her throat because she was bored.  Hospitalized 4/19 - 5/2, treated for pneumonia/COPD exacerbation, psychiatry rescinded IVC and patient discharged home. Seen by PCP 5/15 for dementia with behavioral disturbance and referral to geriatric medicine was made. Palliative care consulted to discuss goals of care with family.  If she continues to have poor oral intake, at risk for further decline and even death.  I have updated both patient's daughter and granddaughter who is the healthcare power of attorney with the same.  I do not believe that alternative means of nutrition i.e. tube feeding would be in patient's best interest.  Dementia with behavioral abnormalities: B12 (high/1947), B1 (normal), RPR (nonreactive). TSH mildly elevated but also free T4 up.  Ammonia normal. Ongoing behavioral issues as noted above.  Depakote started this admission, was briefly changed to IV which she took for a couple of days and then even started refusing this.  Compared to earlier in admission, mental status overall seems to be somewhat better, less combative, at least takes some of her oral medications.  Changed IV Depakote to Depakote sprinkles.  Continue bedtime Remeron and daytime Zoloft.  Seroquel and trazodone have been discontinued this admission.  Check a.m. Depakote level prior to morning dose on 6/6 if still here or at nursing facility. Thiamine levels normal, discontinued IV thiamine. Has tele sitter for safety but will likely have to be off of it for 24 hours if going to SNF. Psychiatry signed off 6/2. Depakote level low/24 on 6/2.  CBC CMP unremarkable except for hypokalemia.   Hypokalemia Replaced.  Follow periodically.  Suicidal ideation Patient came in IVC. Psychiatry consulted 5/25, did not feel that she was suicidal and signed off.  Paroxysmal atrial fibrillation EKG showed A-fib with heart rate of 47. TSH 5.399. TTE showed LVEF  65 to 70% and grade 1 diastolic dysfunction. CHA2DS2-VASc score 5 Cardiology consulted, patient had brief episode of A-fib and now back in normal sinus rhythm.  No beta-blockers indicated. Agree with cardiology that patient is not a candidate for anticoagulation due to advanced dementia, noncompliance. As per cardiology, no need to follow-up with them.  Troponin elevation Mild, not consistent with ACS. No angina. TTE showed normal EF. Suspected due to brief A-fib  Hypertension: Controlled.  Continue amlodipine if patient takes  Hyperlipidemia: Continue statins if patient takes.  Hypothyroidism: Continue Synthroid. TSH 5.4 and free T4 mildly elevated. Consider repeating TSH in 4 to 6 weeks after consistent compliance with meds.  Prolonged QTc Replaced K and magnesium as noted above. EKG 5/31 with QTc 468 ms down from 498 on 5/26. Minimize QTc prolonging medications as much as able.  COPD: No clinical bronchospasm.  Body mass index is 33.51 kg/m.     DVT prophylaxis: enoxaparin (LOVENOX) injection 40 mg Start: 12/11/21 1000     Code Status: Full Code:  Family Communication: Unable to reach granddaughter, left VM message on 6/2. Disposition:  Status is: Inpatient Ongoing refusal to p.o. intake, meds, adjusting meds, awaiting psychiatry follow-up. Patient is medically optimized for DC to next level of care.  TOC exploring options.  Was hopeful for a group home but that appears to have fallen through.  TOC is exploring alternate options.   Consultants:   Cardiology Psychiatry Palliative care  Procedures:   None  Antimicrobials:   None at this time   Subjective:  No new issues.  Extremely difficult historian.  When this provider tried to speak with her, she kept looking at the TV and then after repeated attempts, stated at the provider and said she was trying to sleep.  Again refused to eat breakfast because it was cold and there was no utensils.  Does not want to  engage any further.  Objective:   Vitals:   12/20/21 1938 12/20/21 2014 12/21/21 0353 12/21/21 1248  BP:  (!) 113/49 (!) 123/111 (!) 120/104  Pulse:  (!) 47 (!) 50 66  Resp:  20 20 18   Temp:  98 F (36.7 C)  97.9 F (36.6 C)  TempSrc:  Oral    SpO2: 94% 93% 92% 95%  Weight:      Height:       General exam: Elderly female, moderately built and nourished, lying comfortably supine in bed without distress.  Given patient's behaviors over the last couple of days, do not feel comfortable or safe examining her.  If she is left alone by herself, she is not aggressive or combative. Central nervous system: Alert and only oriented to self and place. No focal neurological deficits. Extremities: Symmetric 5 x 5 power.  As per RN, has ambulated to the bathroom a couple times yesterday. Psychiatry: Judgement and insight impaired. Mood & affect flat and does not want to engage and have to repeatedly ask her questions.     Data Reviewed:   I have personally reviewed following labs and imaging studies   CBC: Recent Labs  Lab 12/16/21 1401 12/20/21 0457  WBC 10.0 7.6  HGB 14.8 14.4  HCT 43.2 44.4  MCV 85.4 87.2  PLT 218 123456    Basic Metabolic  Panel: Recent Labs  Lab 12/17/21 0927 12/18/21 0529 12/20/21 0457  NA 137  --  142  K 4.7  --  3.4*  CL 107  --  109  CO2 22  --  24  GLUCOSE 84  --  85  BUN 16  --  15  CREATININE 0.71 0.63 0.56  CALCIUM 8.4*  --  8.9  MG 2.0  --   --     Liver Function Tests: Recent Labs  Lab 12/17/21 0927 12/20/21 0457  AST 32 31  ALT 19 18  ALKPHOS 54 49  BILITOT 1.4* 0.6  PROT 6.5 6.4*  ALBUMIN 2.8* 2.9*    CBG: No results for input(s): GLUCAP in the last 168 hours.  Microbiology Studies:   Recent Results (from the past 240 hour(s))  Urine Culture     Status: None   Collection Time: 12/12/21  6:23 PM   Specimen: Urine, Clean Catch  Result Value Ref Range Status   Specimen Description   Final    URINE, CLEAN CATCH Performed at  [facility], [place] [address] [place], [place] [address]    Special Requests   Final    NONE Performed at [facility], [place] [address], [place], [place] [address]    Culture   Final    NO GROWTH Performed at [facility], [place] [address], [place], [place] [address]    Report Status 12/13/2021 FINAL  Final    Radiology Studies:  No results found.  Scheduled Meds:    amLODipine  10 mg Oral Daily   divalproex  250 mg Oral Q12H   enoxaparin (LOVENOX) injection  40 mg Subcutaneous Q24H   levothyroxine  50 mcg Oral q AM   mirtazapine  7.5 mg Oral QHS   mometasone-formoterol  2 puff Inhalation BID   montelukast  10 mg Oral QHS   pantoprazole  40 mg Oral Daily   rosuvastatin  5 mg Oral Daily   sertraline  100 mg Oral q morning   sodium chloride flush  3 mL Intravenous Q12H    Continuous Infusions:       LOS: 9 days     Vernell Leep, MD,  FACP, [facility], [facility], [facility] (Care Management Physician Certified) [place]  To contact the attending provider between 7A-7P or the covering provider during after hours 7P-7A, please log into the web site [email or web address] and access using universal [place] password for that web site. If you do not have the password, please call the hospital operator.  12/21/2021, 5:04 PM

## 2021-12-22 DIAGNOSIS — F03918 Unspecified dementia, unspecified severity, with other behavioral disturbance: Secondary | ICD-10-CM | POA: Diagnosis not present

## 2021-12-22 DIAGNOSIS — E876 Hypokalemia: Secondary | ICD-10-CM | POA: Diagnosis not present
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
## 2021-12-23 DIAGNOSIS — F03918 Unspecified dementia, unspecified severity, with other behavioral disturbance: Secondary | ICD-10-CM | POA: Diagnosis not present

## 2021-12-23 DIAGNOSIS — E876 Hypokalemia: Secondary | ICD-10-CM | POA: Diagnosis not present

## 2021-12-23 NOTE — TOC Progression Note (Addendum)
Transition of Care [facility]) - Progression Note    Patient Details  Name: Jackie Garcia MRN: [id number] Date of Birth: [birth year]  Transition of Care [facility]) CM/SW Contact  Otelia Santee, LCSW Phone Number: 12/23/2021, 1:59 PM  Clinical Narrative:    Pt is no longer being recommended for inpt geri-psych or SNF. CSW supervisor, Lafonda Mosses is actively seeking appropriate placement for this pt at ALF/Memory Care facilities. - [facility] to [facility].  [facility] has been contacted for bed availability.  -A referral has been made to [facility].  -[facility] in [place] denied this pt due to being out of network with [facility].   CSW attempted to reach pt's granddaughter, Jackie Garcia [phone]) to obtain Medicaid case worker's information in regards to the status of this pt's Medicaid application and pending number.    Expected Discharge Plan:  (Undetermined) Barriers to Discharge: Family Issues, Unsafe home situation  Expected Discharge Plan and Services Expected Discharge Plan:  (Undetermined)                                               Social Determinants of Health (SDOH) Interventions    Readmission Risk Interventions     View : No data to display.
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
## 2021-12-24 DIAGNOSIS — E876 Hypokalemia: Secondary | ICD-10-CM | POA: Diagnosis not present

## 2021-12-24 DIAGNOSIS — J449 Chronic obstructive pulmonary disease, unspecified: Secondary | ICD-10-CM | POA: Diagnosis not present

## 2021-12-24 DIAGNOSIS — F03918 Unspecified dementia, unspecified severity, with other behavioral disturbance: Secondary | ICD-10-CM | POA: Diagnosis not present

## 2021-12-24 DIAGNOSIS — Z0000 Encounter for general adult medical examination without abnormal findings: Secondary | ICD-10-CM

## 2021-12-24 LAB — COMPREHENSIVE METABOLIC PANEL
ALT: 16 U/L (ref 0–44)
AST: 36 U/L (ref 15–41)
Albumin: 3.2 g/dL — ABNORMAL LOW (ref 3.5–5.0)
Alkaline Phosphatase: 58 U/L (ref 38–126)
Anion gap: 10 (ref 5–15)
BUN: 16 mg/dL (ref 8–23)
CO2: 27 mmol/L (ref 22–32)
Calcium: 9.4 mg/dL (ref 8.9–10.3)
Chloride: 103 mmol/L (ref 98–111)
Creatinine, Ser: 0.65 mg/dL (ref 0.44–1.00)
GFR, Estimated: >60 mL/min (ref >60)
Glucose, Bld: 87 mg/dL (ref 70–99)
Potassium: 3.8 mmol/L (ref 3.5–5.1)
Sodium: 140 mmol/L (ref 135–145)
Total Bilirubin: 0.5 mg/dL (ref 0.3–1.2)
Total Protein: 7.2 g/dL (ref 6.5–8.1)

## 2021-12-24 LAB — CBC
HCT: 47.7 % — ABNORMAL HIGH (ref 36.0–46.0)
Hemoglobin: 15.3 g/dL — ABNORMAL HIGH (ref 12.0–15.0)
MCH: 28.2 pg (ref 26.0–34.0)
MCHC: 32.1 g/dL (ref 30.0–36.0)
MCV: 87.8 fL (ref 80.0–100.0)
Platelets: 328 10*3/uL (ref 150–400)
RBC: 5.43 MIL/uL — ABNORMAL HIGH (ref 3.87–5.11)
RDW: 14.9 % (ref 11.5–15.5)
WBC: 7.5 10*3/uL (ref 4.0–10.5)
nRBC: 0 % (ref 0.0–0.2)

## 2021-12-24 LAB — VALPROIC ACID LEVEL: Valproic Acid Lvl: 52 ug/mL (ref 50.0–100.0)

## 2021-12-24 LAB — MAGNESIUM: Magnesium: 1.9 mg/dL (ref 1.7–2.4)

## 2021-12-24 NOTE — Progress Notes (Signed)
Patient was very uncooperative for EKG. Patient combative and cursing at 3 staff members present to obtain EKG. Jackie Garcia notified via secure chat.

## 2021-12-24 NOTE — TOC Progression Note (Addendum)
Transition of Care [facility]) - Progression Note    Patient Details  Name: Jackie Garcia MRN: [id number] Date of Birth: 1940/08/04  [facility]) CM/SW Contact  Otelia Santee, LCSW Phone Number: 12/24/2021, 11:23 AM  Clinical Narrative:    CSW left voicemail with patients granddaughter, [name], in attempt to obtain information regarding this pt's medicaid application/status.   1140: CSW received return call from pt's granddaughter [name]. She reports she has to meet with her attorney today to get HCPOA paperwork to provide to DSS for pt's Medicaid application. She will get Medicaid pending number and case worker information when she provides HCPOA paperwork and agreed to reach out to CSW with this information.   1250: - Pt was declined at [facility].                                                                                                                            -A referral has been sent to [facility] and [facility] for placement.                                                                                                                   -No be availability at [facility].   1535: Pt has a bed offer at [facility]. CSW spoke with pt's granddaughter/HCPOA who is agreeable to pt transferring to this facility. Pt's granddaughter is to call pt's PCP to have them fax over pt's vaccination records as she does not have them. Insurance authorization has been started.   Expected Discharge Plan:  (Undetermined) Barriers to Discharge: Family Issues, Unsafe home situation  Expected Discharge Plan and Services Expected Discharge Plan:  (Undetermined)                                               Social Determinants of Health (SDOH) Interventions    Readmission Risk Interventions     View : No data to display.

## 2021-12-24 NOTE — Progress Notes (Signed)
[facility]  PROGRESS NOTE  Jackie Garcia [id number] DOB: 13-May-1941 DOA: 12/10/2021 PCP: Pcp, No   Brief HPI:    81 year old female with medical history of COPD, depression, anxiety, dementia, hypothyroidism was sent to the ED under IVC after she threatened to kill herself with a knife.  As per family patient has not been eating and drinking and refuse medications for several days.  She told family that she will kill herself. Patient was brought to the ED and found to have hypokalemia. Psychiatry was consulted, patient cleared by psychiatry.  IVC rescinded.   Hospital course complicated by ongoing refusal of oral intake, meds, behavioral issues i.e. hitting/biting/scratching at nursing staff.  Palliative care consulted for [place] discussions.   Psychiatry reconsulted to assist with behavioral health issues and signed off.   Ongoing issues with inconsistent intake of meds/refusal, poor oral intake, intermittent agitation and hitting out at staff.  Medically optimized for DC to next level of care.  TOC working to find skin nursing facility.   Subjective   Patient seen and examined, angry and agitated.  Did not want to answer questions.   Assessment/Plan:    Adult failure to thrive Secondary to progressive/advanced dementia with behavioral abnormalities complicating advanced age. Seen by psychiatry on 11/06/2021 when she presented to [facility] ED similar to current presentation, via law enforcement and under IVC.  At that time too, she was looking for a knife to slit her throat because she was bored.  Hospitalized 4/19 - 5/2, treated for pneumonia/COPD exacerbation, psychiatry rescinded IVC and patient discharged home. Seen by PCP 5/15 for dementia with behavioral disturbance and referral to geriatric medicine was made. Palliative care consulted to discuss goals of care with family.   -Patient is full code, full scope of care  Dementia with behavior abnormalities -B12 (high/1947),  B1 (normal), RPR (nonreactive). TSH mildly elevated but also free T4 up.  Ammonia normal. -Patient currently on Depakote, Remeron, Zoloft -IV thiamine was started which was discontinued as thiamine level came back normal -Psychiatry signed off on 12-20-21 -Depakote level obtained today is 52, within normal range  Paroxysmal atrial fibrillation --EKG showed atrial fibrillation with heart rate 47 -TSH 5.399 -Echocardiogram shows EF 65 to [id number], grade 1 diastolic dysfunction -99991111 score is 5 -Cardiology was consulted, patient had brief episode of atrial fibrillation and is now back to normal sinus rhythm -Not a candidate for anticoagulation due to suicidal ideation and dementia as per cardiology   Suicidal ideation -IVC  -Psych was consulted and IVC rescinded -Continue one-to-one observation -Patient is not suicidal as per psych  Troponin elevation -Patient had mild troponin elevation -No chest pain or shortness of breath -Echocardiogram shows EF of 65 to 70%     COPD -No coughing or wheezing noted -Continue ICS/LABA, as needed albuterol   Hypertension -Continue Norvasc   Hypothyroidism -Continue Synthroid -TSH 5.399   ?  UTI -Patient had abnormal UA, urine culture obtained showed no growth -She was empirically started on IV Rocephin -Rocephin was discontinued   Mild QTc prolongation -EKG from 5/31 showed QTc 468 ms -Minimize QTc prolonging medications -Potassium,magnesium have been replaced -We will repeat EKG in a.m.    Medications     amLODipine  10 mg Oral Daily   divalproex  250 mg Oral Q12H   enoxaparin (LOVENOX) injection  40 mg Subcutaneous Q24H   levothyroxine  50 mcg Oral q AM   mirtazapine  7.5 mg Oral QHS   mometasone-formoterol  2 puff Inhalation  BID   montelukast  10 mg Oral QHS   pantoprazole  40 mg Oral Daily   rosuvastatin  5 mg Oral Daily   sertraline  100 mg Oral q morning   sodium chloride flush  3 mL Intravenous Q12H     Data  Reviewed:   CBG:  No results for input(s): GLUCAP in the last 168 hours.  SpO2: 96 %    Vitals:   12/23/21 0746 12/23/21 1425 12/23/21 2130 12/24/21 0602  BP:  (!) 127/59 129/78 120/79  Pulse: 74 71 70 76  Resp: 18 19 17 18   Temp:   (!) 97.4 F (36.3 C) 97.9 F (36.6 C)  TempSrc:   Oral Oral  SpO2:  92% 94% 96%  Weight:      Height:          Data Reviewed:  Basic Metabolic Panel: Recent Labs  Lab 12/18/21 0529 12/20/21 0457 12/24/21 0554  NA  --  142 140  K  --  3.4* 3.8  CL  --  109 103  CO2  --  24 27  GLUCOSE  --  85 87  BUN  --  15 16  CREATININE 0.63 0.56 0.65  CALCIUM  --  8.9 9.4  MG  --   --  1.9    CBC: Recent Labs  Lab 12/20/21 0457 12/24/21 0554  WBC 7.6 7.5  HGB 14.4 15.3*  HCT 44.4 47.7*  MCV 87.2 87.8  PLT 256 328    LFT Recent Labs  Lab 12/20/21 0457 12/24/21 0554  AST 31 36  ALT 18 16  ALKPHOS 49 58  BILITOT 0.6 0.5  PROT 6.4* 7.2  ALBUMIN 2.9* 3.2*     Antibiotics: Anti-infectives (From admission, onward)    Start     Dose/Rate Route Frequency Ordered Stop   12/12/21 1745  cefTRIAXone (ROCEPHIN) 1 g in sodium chloride 0.9 % 100 mL IVPB  Status:  Discontinued        1 g 200 mL/hr over 30 Minutes Intravenous Every 24 hours 12/12/21 1657 12/14/21 1348        DVT prophylaxis: Lovenox  Code Status: Full code  Family Communication: No family at bedside   CONSULTS psychiatry, palliative care,cardiology   Objective    Physical Examination:   General-appears agitated Heart-S1-S2, regular, no murmur auscultated Lungs-clear to auscultation bilaterally, no wheezing or crackles auscultated Abdomen-soft, nontender, no organomegaly Extremities-no edema in the lower extremities Neuro-alert, oriented to self only, no focal deficit noted  Status is: Inpatient: Altered mental status        [place]   [facility] If 7PM-7AM, please contact night-coverage at [email or web address], Office   [phone]   12/24/2021, 5:56 PM  LOS: 12 days

## 2021-12-24 NOTE — TOC Progression Note (Addendum)
Transition of Care [facility]) - Progression Note    Patient Details  Name: Jackie Garcia MRN: [id number] Date of Birth: 12-18-1940  Transition of Care [facility]) CM/SW Contact  Ross Ludwig, [place] Phone Number: 12/24/2021, 4:03 PM  Clinical Narrative:     TOC was informed that patient has a bed offer at [facility].  Insurance authorization was initiated and patient was approved by [employer].  [name] ID [id number] with a start date 12/25/2021, next review 12/27/2021.  TOC to continue to follow patient's progress throughout discharge planning.  Expected Discharge Plan:  (Undetermined) Barriers to Discharge: Family Issues, Unsafe home situation  Expected Discharge Plan and Services Expected Discharge Plan:  (Undetermined)                                               Social Determinants of Health (SDOH) Interventions    Readmission Risk Interventions     View : No data to display.

## 2021-12-25 DIAGNOSIS — E876 Hypokalemia: Secondary | ICD-10-CM | POA: Diagnosis not present

## 2021-12-25 DIAGNOSIS — F03918 Unspecified dementia, unspecified severity, with other behavioral disturbance: Secondary | ICD-10-CM | POA: Diagnosis not present

## 2021-12-25 DIAGNOSIS — R778 Other specified abnormalities of plasma proteins: Secondary | ICD-10-CM | POA: Diagnosis not present

## 2021-12-25 DIAGNOSIS — J449 Chronic obstructive pulmonary disease, unspecified: Secondary | ICD-10-CM | POA: Diagnosis not present

## 2021-12-25 LAB — CREATININE, SERUM
Creatinine, Ser: 0.69 mg/dL (ref 0.44–1.00)
GFR, Estimated: >60 mL/min (ref >60)

## 2021-12-25 MED ORDER — DIVALPROEX SODIUM 125 MG PO CSDR
250.0000 mg | DELAYED_RELEASE_CAPSULE | Freq: Two times a day (BID) | ORAL | Status: AC
Start: 2021-12-25 — End: ?

## 2021-12-25 NOTE — TOC Transition Note (Signed)
Transition of Care [facility]) - CM/SW Discharge Note   Patient Details  Name: Jackie Garcia MRN: [id number] Date of Birth: [birth year]  Transition of Care [facility]) CM/SW Contact:  Otelia Santee, LCSW Phone Number: 12/25/2021, 10:07 AM   Clinical Narrative:    Pt has been accepted to [facility] for SNF placement. Her room number will be 112A. Call to report to [place] at [phone]. Pt's granddaughter/HCPOA has been notified and is agreeable to transfer; she is to sign pt into facility today prior to pt arriving. CSW faxed records request for pt's vaccination records to pt's primary care office with [facility] in [place] and will send to SNF once received. PTAR called at [phone].    Final next level of care: Skilled Nursing Facility Barriers to Discharge: Barriers Resolved   Patient Goals and CMS Choice Patient states their goals for this hospitalization and ongoing recovery are:: Return home   Choice offered to / list presented to : [facility] POA / Guardian  Discharge Placement   Existing PASRR number confirmed : 12/18/21          Patient chooses bed at:  [facility]) Patient to be transferred to facility by: PTAR Name of family member notified: [name] Patient and family notified of of transfer: 12/25/21  Discharge Plan and Services                DME Arranged: N/A                    Social Determinants of Health (SDOH) Interventions     Readmission Risk Interventions    12/25/2021   10:04 AM  Readmission Risk Prevention Plan  Transportation Screening Complete  PCP or Specialist Appt within 5-7 Days Complete  Home Care Screening Complete  Medication Review (RN CM) Complete

## 2021-12-25 NOTE — Progress Notes (Signed)
Discharge instructions printed and placed in discharge packet.  Pt confused and refuses any education or care.

## 2021-12-25 NOTE — Care Management Important Message (Signed)
Important Message  Patient Details IM Letter placed in Patients room. Name: Jackie Garcia MRN: [id number] Date of Birth: Mar 17, 1941   Medicare Important Message Given:  Yes     Caren Macadam 12/25/2021, 11:32 AM

## 2021-12-25 NOTE — Discharge Summary (Signed)
Physician Discharge Summary   Patient: Jackie Garcia MRN: [id number] DOB: Jun 12, 1941  Admit date:     12/10/2021  Discharge date: 12/25/21  Discharge Physician: Oswald Hillock   PCP: Pcp, No   Recommendations at discharge:   Patient to be discharged to skilled nursing facility  Discharge Diagnoses: Principal Problem:   Hypokalemia Active Problems:   COPD (chronic obstructive pulmonary disease) ([place])   Hypothyroidism   Depression   Essential hypertension   Elevated troponin   Dehydration  Resolved Problems:   * No resolved hospital problems. *  Hospital Course: 81 year old female with medical history of COPD, depression, anxiety, dementia, hypothyroidism was sent to the ED under IVC after she threatened to kill herself with a knife.  As per family patient has not been eating and drinking and refuse medications for several days.  She told family that she will kill herself. Patient was brought to the ED and found to have hypokalemia. Psychiatry was consulted, patient cleared by psychiatry.  IVC rescinded.    Hospital course complicated by ongoing refusal of oral intake, meds, behavioral issues i.e. hitting/biting/scratching at nursing staff.  Palliative care consulted for [place] discussions.   Psychiatry reconsulted to assist with behavioral health issues and signed off.   Ongoing issues with inconsistent intake of meds/refusal, poor oral intake, intermittent agitation and hitting out at staff.  Medically optimized for DC to next level of care.  TOC working to find skin nursing facility.    Assessment and Plan:  Adult failure to thrive Secondary to progressive/advanced dementia with behavioral abnormalities complicating advanced age. Seen by psychiatry on 11/06/2021 when she presented to [facility] ED similar to current presentation, via law enforcement and under IVC.  At that time too, she was looking for a knife to slit her throat because she was bored.  Hospitalized 4/19 - 5/2,  treated for pneumonia/COPD exacerbation, psychiatry rescinded IVC and patient discharged home. Seen by PCP 5/15 for dementia with behavioral disturbance and referral to geriatric medicine was made. Palliative care consulted to discuss goals of care with family.   -Patient is full code, full scope of care   Dementia with behavior abnormalities -B12 (high/1947), B1 (normal), RPR (nonreactive). TSH mildly elevated but also free T4 up.  Ammonia normal. -Patient currently on Depakote,  Zoloft, trazodone -IV thiamine was started which was discontinued as thiamine level came back normal -Psychiatry signed off on 12-20-21 -Depakote level obtained today is 52, within normal range   Paroxysmal atrial fibrillation --EKG showed atrial fibrillation with heart rate 47 -TSH 5.399 -Echocardiogram shows EF 65 to [id number], grade 1 diastolic dysfunction -99991111 score is 5 -Cardiology was consulted, patient had brief episode of atrial fibrillation and is now back to normal sinus rhythm -Not a candidate for anticoagulation due to suicidal ideation and dementia as per cardiology   Suicidal ideation -IVC  -Psych was consulted and IVC rescinded -Continue one-to-one observation -Patient is not suicidal as per psych   Troponin elevation -Patient had mild troponin elevation -No chest pain or shortness of breath -Echocardiogram shows EF of 65 to 70%     COPD -No coughing or wheezing noted -Continue ICS/LABA, as needed albuterol   Hypertension -Continue Norvasc   Hypothyroidism -Continue Synthroid -TSH 5.399   ?  UTI -Patient had abnormal UA, urine culture obtained showed no growth -She was empirically started on IV Rocephin -Rocephin was discontinued   Mild QTc prolongation -EKG from 5/31 showed QTc 468 ms -Minimize QTc prolonging medications -Potassium,magnesium have been replaced  Consultants: Psychiatry, cardiology, palliative care Procedures performed:  Disposition:  Home Diet recommendation:  Discharge Diet Orders (From admission, onward)     Start     Ordered   12/25/21 0000  Diet - low sodium heart healthy        12/25/21 1110           Regular diet DISCHARGE MEDICATION: Allergies as of 12/25/2021       Reactions   Naproxen Swelling   Sulfa Antibiotics Rash   Codeine Itching        Medication List     TAKE these medications    AeroChamber Plus inhaler Use with inhaler   albuterol 108 (90 Base) MCG/ACT inhaler Commonly known as: VENTOLIN HFA Inhale 2 puffs into the lungs every 4 (four) hours as needed for wheezing or shortness of breath.   amLODipine 10 MG tablet Commonly known as: NORVASC Take 10 mg by mouth daily.   [employer] Purelax 17 g packet Generic drug: polyethylene glycol Take 17 g by mouth daily.   [employer] Senna 8.6 MG tablet Generic drug: senna Take 2 tablets by mouth at bedtime.   divalproex 125 MG capsule Commonly known as: DEPAKOTE SPRINKLE Take 2 capsules (250 mg total) by mouth every 12 (twelve) hours.   levothyroxine 50 MCG tablet Commonly known as: SYNTHROID Take 50 mcg by mouth in the morning.   montelukast 10 MG tablet Commonly known as: SINGULAIR Take 10 mg by mouth daily.   MULTIVITAMIN PO Take 1 tablet by mouth daily.   omeprazole 20 MG capsule Commonly known as: PRILOSEC Take 20 mg by mouth daily.   rosuvastatin 5 MG tablet Commonly known as: CRESTOR Take 5 mg by mouth in the morning.   sertraline 100 MG tablet Commonly known as: ZOLOFT Take 100 mg by mouth every morning.   Symbicort 160-4.5 MCG/ACT inhaler Generic drug: budesonide-formoterol Inhale 2 puffs into the lungs 2 (two) times daily.   traZODone 100 MG tablet Commonly known as: DESYREL Take 100 mg by mouth at bedtime as needed.        Discharge Exam: Filed Weights   12/10/21 2001  Weight: 83.1 kg   General-appears in no acute distress Heart-S1-S2, regular, no murmur auscultated Lungs-clear to auscultation  bilaterally, no wheezing or crackles auscultated Abdomen-soft, nontender, no organomegaly Extremities-no edema in the lower extremities Neuro-alert, oriented x3, no focal deficit noted   Condition at discharge: good  The results of significant diagnostics from this hospitalization (including imaging, microbiology, ancillary and laboratory) are listed below for reference.   Imaging Studies: DG Chest Port 1 View  Result Date: 12/11/2021 CLINICAL DATA:  Abnormal EKG.  Psychiatric patient EXAM: PORTABLE CHEST 1 VIEW COMPARISON:  10/29/2021 FINDINGS: Borderline heart size with diffuse interstitial coarsening which has been seen on priors. No definite Kerley lines, the left lung base is partially seen. No visible effusion or pneumothorax. IMPRESSION: 1. No acute finding when compared to priors. 2. Chronic lung disease and borderline heart size. Electronically Signed   By: Tiburcio Pea M.D.   On: 12/11/2021 05:16   ECHOCARDIOGRAM COMPLETE  Result Date: 12/12/2021    ECHOCARDIOGRAM REPORT   Patient Name:   Jackie Garcia Date of Exam: 12/12/2021 Medical Rec #:  [id number]          Height:       62.0 in Accession #:    [id number]         Weight:       183.2 lb Date of Birth:  1941-03-05           BSA:          1.842 m Patient Age:    50 years           BP:           138/61 mmHg Patient Gender: F                  HR:           59 bpm. Exam Location:  Inpatient Procedure: 2D Echo, Cardiac Doppler and Color Doppler Indications:    A-Fib  History:        Patient has no prior history of Echocardiogram examinations.                 COPD. Hypothyroidism ; Dementia.  Sonographer:    Joette Catching RCS Referring Phys: Earnie Larsson Keone Kamer  Sonographer Comments: Technically challenging study due to limited acoustic windows. Image acquisition challenging due to uncooperative patient. IMPRESSIONS  1. Left ventricular ejection fraction, by estimation, is 65 to 70%. The left ventricle has normal function. The left  ventricle has no regional wall motion abnormalities. There is mild left ventricular hypertrophy. Left ventricular diastolic parameters are consistent with Grade I diastolic dysfunction (impaired relaxation). Elevated left ventricular end-diastolic pressure. The E/e' is 76.  2. Right ventricular systolic function is normal. The right ventricular size is normal. There is normal pulmonary artery systolic pressure. The estimated right ventricular systolic pressure is A999333 mmHg.  3. Left atrial size was mildly dilated.  4. The mitral valve is abnormal. Trivial mitral valve regurgitation.  5. There is sclerosis/calcification of the base of the non-coronary and right coronary cusps. The aortic valve is tricuspid. Aortic valve regurgitation is not visualized. Aortic valve sclerosis/calcification is present, without any evidence of aortic stenosis.  6. The inferior vena cava is normal in size with greater than 50% respiratory variability, suggesting right atrial pressure of 3 mmHg. Comparison(s): No prior Echocardiogram. FINDINGS  Left Ventricle: Left ventricular ejection fraction, by estimation, is 65 to 70%. The left ventricle has normal function. The left ventricle has no regional wall motion abnormalities. The left ventricular internal cavity size was normal in size. There is  mild left ventricular hypertrophy. Left ventricular diastolic parameters are consistent with Grade I diastolic dysfunction (impaired relaxation). Elevated left ventricular end-diastolic pressure. The E/e' is 20. Right Ventricle: The right ventricular size is normal. No increase in right ventricular wall thickness. Right ventricular systolic function is normal. There is normal pulmonary artery systolic pressure. The tricuspid regurgitant velocity is 2.05 m/s, and  with an assumed right atrial pressure of 3 mmHg, the estimated right ventricular systolic pressure is A999333 mmHg. Left Atrium: Left atrial size was mildly dilated. Right Atrium: Right  atrial size was normal in size. Pericardium: There is no evidence of pericardial effusion. Mitral Valve: The mitral valve is abnormal. Mild mitral annular calcification. Trivial mitral valve regurgitation. Tricuspid Valve: The tricuspid valve is grossly normal. Tricuspid valve regurgitation is trivial. Aortic Valve: There is sclerosis/calcification of the base of the non-coronary and right coronary cusps. The aortic valve is tricuspid. Aortic valve regurgitation is not visualized. Aortic valve sclerosis/calcification is present, without any evidence of  aortic stenosis. Aortic valve mean gradient measures 5.0 mmHg. Aortic valve peak gradient measures 8.8 mmHg. Aortic valve area, by VTI measures 2.03 cm. Pulmonic Valve: The pulmonic valve was normal in structure. Pulmonic valve regurgitation is not visualized. Aorta: The aortic root and ascending  aorta are structurally normal, with no evidence of dilitation. Venous: The inferior vena cava is normal in size with greater than 50% respiratory variability, suggesting right atrial pressure of 3 mmHg. IAS/Shunts: No atrial level shunt detected by color flow Doppler.  LEFT VENTRICLE PLAX 2D LVIDd:         4.60 cm     Diastology LVIDs:         2.70 cm     LV e' medial:    2.61 cm/s LV PW:         1.30 cm     LV E/e' medial:  24.8 LV IVS:        1.10 cm     LV e' lateral:   4.03 cm/s LVOT diam:     2.00 cm     LV E/e' lateral: 16.1 LV SV:         66 LV SV Index:   36 LVOT Area:     3.14 cm  LV Volumes (MOD) LV vol d, MOD A2C: 47.9 ml LV vol d, MOD A4C: 63.1 ml LV vol s, MOD A2C: 22.8 ml LV vol s, MOD A4C: 30.5 ml LV SV MOD A2C:     25.1 ml LV SV MOD A4C:     63.1 ml LV SV MOD BP:      29.2 ml RIGHT VENTRICLE             IVC RV S prime:     18.00 cm/s  IVC diam: 0.80 cm LEFT ATRIUM             Index LA diam:        3.10 cm 1.68 cm/m LA Vol (A2C):   51.2 ml 27.80 ml/m LA Vol (A4C):   72.2 ml 39.20 ml/m LA Biplane Vol: 61.8 ml 33.56 ml/m  AORTIC VALVE                     PULMONIC VALVE AV Area (Vmax):    2.27 cm     PV Vmax:       0.84 m/s AV Area (Vmean):   2.19 cm     PV Peak grad:  2.8 mmHg AV Area (VTI):     2.03 cm AV Vmax:           148.00 cm/s AV Vmean:          96.700 cm/s AV VTI:            0.327 m AV Peak Grad:      8.8 mmHg AV Mean Grad:      5.0 mmHg LVOT Vmax:         107.00 cm/s LVOT Vmean:        67.500 cm/s LVOT VTI:          0.211 m LVOT/AV VTI ratio: 0.65  AORTA Ao Root diam: 3.40 cm Ao Asc diam:  3.60 cm MITRAL VALVE                TRICUSPID VALVE MV Area (PHT): 2.68 cm     TR Peak grad:   16.8 mmHg MV Decel Time: 283 msec     TR Vmax:        205.00 cm/s MV E velocity: 64.70 cm/s MV A velocity: 101.00 cm/s  SHUNTS MV E/A ratio:  0.64         Systemic VTI:  0.21 m  Systemic Diam: 2.00 cm Lyman Bishop MD Electronically signed by Lyman Bishop MD Signature Date/Time: 12/12/2021/1:58:38 PM    Final     Microbiology: Results for orders placed or performed during the hospital encounter of 12/10/21  Urine Culture     Status: None   Collection Time: 12/12/21  6:23 PM   Specimen: Urine, Clean Catch  Result Value Ref Range Status   Specimen Description   Final    URINE, CLEAN CATCH Performed at [facility], [place] [address] [place], [place] [address]    Special Requests   Final    NONE Performed at [facility], [place] [address], [place], [place] [address]    Culture   Final    NO GROWTH Performed at [facility], [place] [address] [place], [place] [address]    Report Status 12/13/2021 FINAL  Final    Labs: CBC: Recent Labs  Lab 12/20/21 0457 12/24/21 0554  WBC 7.6 7.5  HGB 14.4 15.3*  HCT 44.4 47.7*  MCV 87.2 87.8  PLT 256 [id number]   Basic Metabolic Panel: Recent Labs  Lab 12/20/21 0457 12/24/21 0554 12/25/21 0907  NA 142 140  --   K 3.4* 3.8  --   CL 109 103  --   CO2 24 27  --   GLUCOSE 85 87  --   BUN 15 16  --   CREATININE 0.56 0.65 0.69  CALCIUM 8.9 9.4   --   MG  --  1.9  --    Liver Function Tests: Recent Labs  Lab 12/20/21 0457 12/24/21 0554  AST 31 36  ALT 18 16  ALKPHOS 49 58  BILITOT 0.6 0.5  PROT 6.4* 7.2  ALBUMIN 2.9* 3.2*   CBG: No results for input(s): GLUCAP in the last 168 hours.  Discharge time spent: greater than 30 minutes.  Signed: Oswald Hillock, MD [facility] 12/25/2021

## 2021-12-25 NOTE — Plan of Care (Signed)
